# Patient Record
Sex: Female | Born: 1980 | Race: Black or African American | Hispanic: No | Marital: Single | Smoking: Former smoker
Health system: Southern US, Community
[De-identification: ages and names within clinical notes are randomized; demographics above are authoritative.]

## PROBLEM LIST (undated history)

## (undated) ENCOUNTER — Inpatient Hospital Stay (HOSPITAL_COMMUNITY): Payer: Self-pay

## (undated) DIAGNOSIS — M1990 Unspecified osteoarthritis, unspecified site: Secondary | ICD-10-CM

---
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
## 2002-04-05 ENCOUNTER — Encounter: Payer: Self-pay | Admitting: Obstetrics and Gynecology

## 2002-04-05 ENCOUNTER — Inpatient Hospital Stay (HOSPITAL_COMMUNITY): Admission: AD | Admit: 2002-04-05 | Discharge: 2002-04-05 | Payer: Self-pay | Admitting: Obstetrics and Gynecology

## 2002-04-07 ENCOUNTER — Inpatient Hospital Stay (HOSPITAL_COMMUNITY): Admission: AD | Admit: 2002-04-07 | Discharge: 2002-04-07 | Payer: Self-pay | Admitting: *Deleted

## 2002-04-20 ENCOUNTER — Inpatient Hospital Stay (HOSPITAL_COMMUNITY): Admission: AD | Admit: 2002-04-20 | Discharge: 2002-04-20 | Payer: Self-pay | Admitting: Family Medicine

## 2002-06-30 ENCOUNTER — Emergency Department (HOSPITAL_COMMUNITY): Admission: EM | Admit: 2002-06-30 | Discharge: 2002-06-30 | Payer: Self-pay | Admitting: Emergency Medicine

## 2002-09-07 ENCOUNTER — Emergency Department (HOSPITAL_COMMUNITY): Admission: EM | Admit: 2002-09-07 | Discharge: 2002-09-07 | Payer: Self-pay | Admitting: Emergency Medicine

## 2003-01-28 ENCOUNTER — Encounter: Payer: Self-pay | Admitting: Emergency Medicine

## 2003-01-28 ENCOUNTER — Emergency Department (HOSPITAL_COMMUNITY): Admission: EM | Admit: 2003-01-28 | Discharge: 2003-01-28 | Payer: Self-pay | Admitting: Emergency Medicine

## 2003-05-14 ENCOUNTER — Emergency Department (HOSPITAL_COMMUNITY): Admission: EM | Admit: 2003-05-14 | Discharge: 2003-05-14 | Payer: Self-pay | Admitting: Emergency Medicine

## 2003-06-24 ENCOUNTER — Emergency Department (HOSPITAL_COMMUNITY): Admission: EM | Admit: 2003-06-24 | Discharge: 2003-06-24 | Payer: Self-pay | Admitting: Emergency Medicine

## 2004-03-29 ENCOUNTER — Emergency Department (HOSPITAL_COMMUNITY): Admission: EM | Admit: 2004-03-29 | Discharge: 2004-03-29 | Payer: Self-pay | Admitting: Family Medicine

## 2004-06-14 ENCOUNTER — Inpatient Hospital Stay (HOSPITAL_COMMUNITY): Admission: AD | Admit: 2004-06-14 | Discharge: 2004-06-14 | Payer: Self-pay | Admitting: Obstetrics & Gynecology

## 2004-07-07 ENCOUNTER — Inpatient Hospital Stay (HOSPITAL_COMMUNITY): Admission: AD | Admit: 2004-07-07 | Discharge: 2004-07-07 | Payer: Self-pay | Admitting: *Deleted

## 2004-08-07 ENCOUNTER — Inpatient Hospital Stay (HOSPITAL_COMMUNITY): Admission: AD | Admit: 2004-08-07 | Discharge: 2004-08-07 | Payer: Self-pay | Admitting: Obstetrics and Gynecology

## 2004-09-16 ENCOUNTER — Ambulatory Visit (HOSPITAL_COMMUNITY): Admission: RE | Admit: 2004-09-16 | Discharge: 2004-09-16 | Payer: Self-pay | Admitting: *Deleted

## 2004-10-14 ENCOUNTER — Ambulatory Visit (HOSPITAL_COMMUNITY): Admission: RE | Admit: 2004-10-14 | Discharge: 2004-10-14 | Payer: Self-pay | Admitting: *Deleted

## 2004-11-03 ENCOUNTER — Inpatient Hospital Stay: Admission: AD | Admit: 2004-11-03 | Discharge: 2004-11-03 | Payer: Self-pay | Admitting: Obstetrics & Gynecology

## 2004-11-03 ENCOUNTER — Ambulatory Visit: Payer: Self-pay | Admitting: Obstetrics & Gynecology

## 2004-12-17 ENCOUNTER — Inpatient Hospital Stay (HOSPITAL_COMMUNITY): Admission: AD | Admit: 2004-12-17 | Discharge: 2004-12-17 | Payer: Self-pay | Admitting: *Deleted

## 2005-02-04 ENCOUNTER — Ambulatory Visit (HOSPITAL_COMMUNITY): Admission: RE | Admit: 2005-02-04 | Discharge: 2005-02-04 | Payer: Self-pay | Admitting: *Deleted

## 2005-02-18 ENCOUNTER — Ambulatory Visit: Payer: Self-pay | Admitting: *Deleted

## 2005-02-23 ENCOUNTER — Inpatient Hospital Stay (HOSPITAL_COMMUNITY): Admission: AD | Admit: 2005-02-23 | Discharge: 2005-02-28 | Payer: Self-pay | Admitting: Family Medicine

## 2005-02-23 ENCOUNTER — Ambulatory Visit: Payer: Self-pay | Admitting: *Deleted

## 2005-02-27 ENCOUNTER — Encounter (INDEPENDENT_AMBULATORY_CARE_PROVIDER_SITE_OTHER): Payer: Self-pay | Admitting: Specialist

## 2005-07-16 ENCOUNTER — Emergency Department (HOSPITAL_COMMUNITY): Admission: EM | Admit: 2005-07-16 | Discharge: 2005-07-16 | Payer: Self-pay | Admitting: Emergency Medicine

## 2005-10-15 ENCOUNTER — Inpatient Hospital Stay (HOSPITAL_COMMUNITY): Admission: AD | Admit: 2005-10-15 | Discharge: 2005-10-15 | Payer: Self-pay | Admitting: Gynecology

## 2005-10-22 ENCOUNTER — Encounter (INDEPENDENT_AMBULATORY_CARE_PROVIDER_SITE_OTHER): Payer: Self-pay | Admitting: Specialist

## 2005-10-22 ENCOUNTER — Ambulatory Visit: Payer: Self-pay | Admitting: Gynecology

## 2005-10-22 ENCOUNTER — Ambulatory Visit (HOSPITAL_COMMUNITY): Admission: AD | Admit: 2005-10-22 | Discharge: 2005-10-22 | Payer: Self-pay | Admitting: Gynecology

## 2006-01-23 ENCOUNTER — Inpatient Hospital Stay (HOSPITAL_COMMUNITY): Admission: AD | Admit: 2006-01-23 | Discharge: 2006-01-23 | Payer: Self-pay | Admitting: Obstetrics & Gynecology

## 2006-03-26 ENCOUNTER — Emergency Department (HOSPITAL_COMMUNITY): Admission: EM | Admit: 2006-03-26 | Discharge: 2006-03-26 | Payer: Self-pay | Admitting: Emergency Medicine

## 2006-04-03 ENCOUNTER — Inpatient Hospital Stay (HOSPITAL_COMMUNITY): Admission: AD | Admit: 2006-04-03 | Discharge: 2006-04-03 | Payer: Self-pay | Admitting: Gynecology

## 2006-06-09 ENCOUNTER — Ambulatory Visit: Payer: Self-pay | Admitting: Family Medicine

## 2006-06-09 ENCOUNTER — Inpatient Hospital Stay (HOSPITAL_COMMUNITY): Admission: AD | Admit: 2006-06-09 | Discharge: 2006-06-09 | Payer: Self-pay | Admitting: Family Medicine

## 2006-07-23 ENCOUNTER — Ambulatory Visit (HOSPITAL_COMMUNITY): Admission: RE | Admit: 2006-07-23 | Discharge: 2006-07-23 | Payer: Self-pay | Admitting: Obstetrics & Gynecology

## 2006-09-02 ENCOUNTER — Inpatient Hospital Stay (HOSPITAL_COMMUNITY): Admission: AD | Admit: 2006-09-02 | Discharge: 2006-09-06 | Payer: Self-pay | Admitting: Obstetrics & Gynecology

## 2006-09-03 ENCOUNTER — Encounter (INDEPENDENT_AMBULATORY_CARE_PROVIDER_SITE_OTHER): Payer: Self-pay | Admitting: Specialist

## 2006-10-16 ENCOUNTER — Emergency Department (HOSPITAL_COMMUNITY): Admission: EM | Admit: 2006-10-16 | Discharge: 2006-10-16 | Payer: Self-pay | Admitting: Emergency Medicine

## 2008-06-21 ENCOUNTER — Emergency Department (HOSPITAL_COMMUNITY): Admission: EM | Admit: 2008-06-21 | Discharge: 2008-06-21 | Payer: Self-pay | Admitting: Emergency Medicine

## 2009-03-14 ENCOUNTER — Emergency Department (HOSPITAL_COMMUNITY): Admission: EM | Admit: 2009-03-14 | Discharge: 2009-03-14 | Payer: Self-pay | Admitting: Emergency Medicine

## 2009-03-15 ENCOUNTER — Emergency Department (HOSPITAL_COMMUNITY): Admission: EM | Admit: 2009-03-15 | Discharge: 2009-03-15 | Payer: Self-pay | Admitting: Emergency Medicine

## 2009-06-15 ENCOUNTER — Emergency Department (HOSPITAL_COMMUNITY): Admission: EM | Admit: 2009-06-15 | Discharge: 2009-06-15 | Payer: Self-pay | Admitting: Family Medicine

## 2009-07-09 ENCOUNTER — Emergency Department (HOSPITAL_COMMUNITY): Admission: EM | Admit: 2009-07-09 | Discharge: 2009-07-09 | Payer: Self-pay | Admitting: Family Medicine

## 2009-09-04 ENCOUNTER — Emergency Department (HOSPITAL_COMMUNITY): Admission: EM | Admit: 2009-09-04 | Discharge: 2009-09-04 | Payer: Self-pay | Admitting: Family Medicine

## 2009-10-20 ENCOUNTER — Emergency Department (HOSPITAL_COMMUNITY): Admission: EM | Admit: 2009-10-20 | Discharge: 2009-10-20 | Payer: Self-pay | Admitting: Emergency Medicine

## 2009-10-27 ENCOUNTER — Ambulatory Visit (HOSPITAL_COMMUNITY): Admission: RE | Admit: 2009-10-27 | Discharge: 2009-10-27 | Payer: Self-pay | Admitting: Family Medicine

## 2009-10-27 ENCOUNTER — Emergency Department (HOSPITAL_COMMUNITY): Admission: EM | Admit: 2009-10-27 | Discharge: 2009-10-27 | Payer: Self-pay | Admitting: Family Medicine

## 2009-12-24 ENCOUNTER — Emergency Department (HOSPITAL_COMMUNITY)
Admission: EM | Admit: 2009-12-24 | Discharge: 2009-12-25 | Payer: Self-pay | Source: Home / Self Care | Admitting: Emergency Medicine

## 2010-02-14 ENCOUNTER — Emergency Department (HOSPITAL_COMMUNITY): Admission: EM | Admit: 2010-02-14 | Discharge: 2010-02-14 | Payer: Self-pay | Admitting: Family Medicine

## 2010-05-21 ENCOUNTER — Emergency Department (HOSPITAL_COMMUNITY)
Admission: EM | Admit: 2010-05-21 | Discharge: 2010-05-21 | Payer: Self-pay | Source: Home / Self Care | Admitting: Emergency Medicine

## 2010-06-30 ENCOUNTER — Encounter: Payer: Self-pay | Admitting: Gynecology

## 2010-08-24 LAB — POCT I-STAT, CHEM 8
Chloride: 104 mEq/L (ref 96–112)
Creatinine, Ser: 0.7 mg/dL (ref 0.4–1.2)
HCT: 41 % (ref 36.0–46.0)
Hemoglobin: 13.9 g/dL (ref 12.0–15.0)
TCO2: 26 mmol/L (ref 0–100)

## 2010-08-24 LAB — POCT PREGNANCY, URINE: Preg Test, Ur: NEGATIVE

## 2010-08-25 LAB — POCT PREGNANCY, URINE: Preg Test, Ur: NEGATIVE

## 2010-09-12 LAB — DIFFERENTIAL
Basophils Absolute: 0 10*3/uL (ref 0.0–0.1)
Basophils Relative: 0 % (ref 0–1)
Monocytes Absolute: 0.5 10*3/uL (ref 0.1–1.0)

## 2010-09-12 LAB — CBC
HCT: 37.8 % (ref 36.0–46.0)
MCHC: 33.6 g/dL (ref 30.0–36.0)
Platelets: 259 10*3/uL (ref 150–400)
RBC: 4.66 MIL/uL (ref 3.87–5.11)
WBC: 9.2 10*3/uL (ref 4.0–10.5)

## 2010-09-12 LAB — GONOCOCCUS CULTURE: Culture: NO GROWTH

## 2010-09-12 LAB — BASIC METABOLIC PANEL
CO2: 27 mEq/L (ref 19–32)
Creatinine, Ser: 0.68 mg/dL (ref 0.4–1.2)
GFR calc non Af Amer: >60 mL/min (ref >60)
Sodium: 138 mEq/L (ref 135–145)

## 2010-09-12 LAB — MONONUCLEOSIS SCREEN: Mono Screen: POSITIVE — AB

## 2010-09-12 LAB — RAPID STREP SCREEN (MED CTR MEBANE ONLY): Streptococcus, Group A Screen (Direct): NEGATIVE

## 2010-09-22 ENCOUNTER — Inpatient Hospital Stay (INDEPENDENT_AMBULATORY_CARE_PROVIDER_SITE_OTHER)
Admission: RE | Admit: 2010-09-22 | Discharge: 2010-09-22 | Disposition: A | Discharge status: Home / Self Care | Payer: BC Managed Care – HMO | Source: Ambulatory Visit | Attending: Family Medicine | Admitting: Family Medicine

## 2010-09-22 DIAGNOSIS — J029 Acute pharyngitis, unspecified: Secondary | ICD-10-CM

## 2010-09-22 DIAGNOSIS — J309 Allergic rhinitis, unspecified: Secondary | ICD-10-CM

## 2010-09-23 LAB — URINALYSIS, ROUTINE W REFLEX MICROSCOPIC
Hgb urine dipstick: NEGATIVE
Ketones, ur: NEGATIVE mg/dL
Urobilinogen, UA: 0.2 mg/dL (ref 0.0–1.0)

## 2010-09-23 LAB — URINE MICROSCOPIC-ADD ON

## 2010-09-23 LAB — RAPID STREP SCREEN (MED CTR MEBANE ONLY): Streptococcus, Group A Screen (Direct): NEGATIVE

## 2010-10-22 NOTE — Discharge Summary (Signed)
NAME:  Catherine Underwood, Catherine Underwood            ACCOUNT NO.:  [id number]   MEDICAL RECORD NO.:  [id number]          PATIENT TYPE:  INP   LOCATION:  9119                          [facility]ILITY:  [facility]   PHYSICIAN:  Roseanna Rainbow, M.D.DATE OF BIRTH:  04-May-1981   DATE OF ADMISSION:  09/02/2006  DATE OF DISCHARGE:  09/06/2006                               DISCHARGE SUMMARY   CHIEF COMPLAINT:  The patient is a 30 year old para 1 with an estimated  date of confinement of March 30 with an intrauterine pregnancy at 39+  weeks complaining of uterine contractions.  Please see the dictated  history and physical for further details.   HOSPITAL COURSE:  The patient was admitted.  Low-dose Pitocin was  started to augment her labor.  She failed to progress beyond 8 cm of  dilatation, and the decision was made to proceed with a cesarean  delivery.  Please see the dictated operative summary.  On postoperative  day #1, her hemoglobin was 9.  She was hemodynamically stable.  The  remainder of her hospital course was uneventful, and she was discharged  to home on postoperative day #3.   DISCHARGE DIAGNOSES:  1. Intrauterine pregnancy at term.  2. Arrest of dilatation.  3. Active phase of labor.   PROCEDURE:  Cesarean delivery.   CONDITION:  Stable.   DIET:  Regular.   MEDICATIONS:  Included Percocet.   DISPOSITION:  The patient was to follow up in the office in 1-2 weeks.      Roseanna Rainbow, M.D.  Electronically Signed     LAJ/MEDQ  D:  10/09/2006  T:  10/09/2006  Job:  [id number]

## 2010-10-25 NOTE — Discharge Summary (Signed)
NAME:  Catherine Underwood, Catherine Underwood            ACCOUNT NO.:  [id number]   MEDICAL RECORD NO.:  [id number]          PATIENT TYPE:  INP   LOCATION:  9305                          [facility]   PHYSICIAN:  Tanya S. Shawnie Underwood, M.D.   DATE OF BIRTH:  January 07, 1981   DATE OF ADMISSION:  02/23/2005  DATE OF DISCHARGE:  02/28/2005                                 DISCHARGE SUMMARY   ADMISSION DIAGNOSES:  Intrauterine pregnancy at 41-0/7 weeks for induction  of labor.   DISCHARGE DIAGNOSES:  1.  Normal spontaneous vaginal delivery.  2.  Viable 6 pound 12 ounce female.   DISCHARGE MEDICATIONS:  Prenatal vitamins, ibuprofen.   BRIEF HISTORY:  The patient is a 30 year old gravida 2, para 0-0-1-0, at 41  weeks who presented for induction of labor. The patient is GBS positive.   HOSPITAL COURSE:  Induction of labor was started on September 17 with  Cytotec x1 followed by Cervidil for 12 hours and then Pitocin was started.  Penicillin was also started for GBS prophylaxis.  An epidural was placed on  the morning of September 19.  Variable decelerations with late onset were  seen.  The patient's membranes were ruptured.  An IUPC was placed.  Pitocin  was held and amnion infusion was done.  Pitocin was restarted later in the  day on September 19.  The patient later developed a temperature of 101.4 and  was subsequently started on Unasyn and the patient's fever improved. On  September 20, at 12:39 the patient had a normal spontaneous vaginal delivery  of a female with Apgars of 2 at one minute and 3 at five minutes and 5 at 10  minutes.  Initial cord PH was 7.14.  The baby was handed to the NICU team  and was subsequently intubated.  Initial ABG in the NICU ws pH 7.27, PCO2 of  28, PO2 of 582, bicarb 12.3 and base excess -13.2.  The baby was extubated  later that day with pH of 7.4, PCO2 of 32, PO2 of 65, bicarb 19, and base  excess -4.6.  In terms of the mother, second-degree laceration was repaired.  The patient  postpartum course for the mother was uncomplicated.  Mom's blood  type is O positive. She is rubella immune.  She is breast feeding.  Would  like an IUD placed in six weeks.  She desires circumcision for the child.   CONDITION ON DISCHARGE:  Stable.   DISCHARGE INSTRUCTIONS:  The patient is to avoid sex or placing anything in  her vagina for six weeks.  She is to follow up at [facility] in six  weeks.      Benn Moulder, M.D.    ______________________________  Catherine Underwood, M.D.    MR/MEDQ  D:  02/28/2005  T:  03/01/2005  Job:  [id number]

## 2010-10-25 NOTE — Op Note (Signed)
NAME:  Catherine Underwood, Catherine Underwood            ACCOUNT NO.:  [id number]   MEDICAL RECORD NO.:  [id number]          PATIENT TYPE:  AMB   LOCATION:  SDC                           [facility]:  [facility]   PHYSICIAN:  Ginger Carne, MD  DATE OF BIRTH:  1980/06/18   DATE OF PROCEDURE:  10/22/2005  DATE OF DISCHARGE:                                 OPERATIVE REPORT   PREOPERATIVE DIAGNOSIS:  First trimester missed abortion.   POSTOPERATIVE DIAGNOSIS:  First trimester missed abortion.   PROCEDURE:  Aspiration, dilatation, curettage.   SURGEON:  Ginger Carne, MD   ASSISTANT:  None.   COMPLICATIONS:  None.   ESTIMATED BLOOD LOSS:  Minimal.   SPECIMENS:  Products of conception.   OPERATIVE FINDINGS:  External genitalia, vulva, and vagina normal.  Cervix  without erosions or lesions.  Uterus palpated to approximately 6-8 weeks in  size.  Normal contour, enlarged, consistent with 6-8 week intrauterine  pregnancy.  Both adnexa palpable and found to be normal.  Products of  conception noted in the intrauterine cavity.  No irregularities of the  cavity noted.  Patient is Rh-positive.   OPERATIVE PROCEDURE:  Patient prepped and draped in the usual fashion and  placed in the lithotomy position.  Betadine solution is used for Antiseptic,  and the patient was catheterized prior to the procedure.  After adequate  general anesthesia, a tenaculum placed on the anterior lip of the cervix.  Dilatation to accommodate a #7 suction cannula.  It was followed by sharp  and then suction curettage.  Specimen to pathology.  Patient returned to the  post anesthesia recovery room in excellent condition.      Ginger Carne, MD  Electronically Signed     SHB/MEDQ  D:  10/22/2005  T:  10/23/2005  Job:  [id number]

## 2010-10-25 NOTE — H&P (Signed)
NAME:  Catherine Underwood, Catherine Underwood            ACCOUNT NO.:  [id number]   MEDICAL RECORD NO.:  [id number]          PATIENT TYPE:  INP   LOCATION:  9164                          [facility]   PHYSICIAN:  Roseanna Rainbow, M.D.DATE OF BIRTH:  [birth year]   DATE OF ADMISSION:  09/02/2006  DATE OF DISCHARGE:                              HISTORY & PHYSICAL   CHIEF COMPLAINT:  The patient is a 30 year old para 1 with an estimated  date of confinement of September 06, 2006 with an intrauterine pregnancy at  39+ weeks, complaining of uterine contractions.   HISTORY OF PRESENT ILLNESS:  The patient reports the onset of  contractions for approximately 12 hours prior to presentation.  She  denies ruptured membranes.   ALLERGIES:  No known drug allergies.   OB RISK FACTORS:  Sickle cell trait.   PRENATAL SCREENS:  Chlamydia probe negative.  Urine culture and  sensitivity:  Insignificant growth.  Pap smear negative.  GC probe  negative.  One hour GCT 115.  GBS on March 5th negative.  Hepatitis B  surface antigen negative.  Hematocrit 34.4, hemoglobin 10.7, HIV  nonreactive.  Platelets 254,000.  Blood type is O+.  Antibody screen  negative.  RPR nonreactive.  Rubella immune.  Sickle cell positive.   The most recent ultrasound on February 14th:  The estimated fetal weight  percentile was 78%.  The amniotic fluid was normal.  No previa.   PAST GYN HISTORY:  Noncontributory.   PAST MEDICAL HISTORY:  No significant history of medical diseases.   PAST SURGICAL HISTORY:  D&C.   SOCIAL HISTORY:  She is a Immunologist, retail.  She is single.  She does  not give any significant history of alcohol usage.  Has no significant  smoking history.  Denies illicit drug use.   FAMILY HISTORY:  Kidney disease.   PAST OBSTETRICAL HISTORY:  In January, 2007, she had a spontaneous  abortion.  In September, 2006, she delivered a live born female.  Weight 6  pounds, 12 ounces.  Vaginal delivery.  No complications.   PHYSICAL EXAMINATION:  VITAL SIGNS:  Stable.  Afebrile.  Fetal heart  tracing reassuring.  Tocodynamometry:  Uterine contractions every five  minutes.  GENERAL:  Uncomfortable.  ABDOMEN:  Gravid.  PELVIC:  Sterile vaginal exam is per the RN.  The cervix is 3 cm,  dilated, 80% effaced.  EXTREMITIES:  Nontender.   ASSESSMENT:  Primipara at term, early labor.  Fetal heart tracing  consistent with fetal well being.   PLAN:  Admission.  Expectant management.      Roseanna Rainbow, M.D.  Electronically Signed     LAJ/MEDQ  D:  09/02/2006  T:  09/02/2006  Job:  [id number]

## 2010-10-25 NOTE — Op Note (Signed)
NAME:  Catherine Underwood, Catherine Underwood            ACCOUNT NO.:  [id number]   MEDICAL RECORD NO.:  [id number]          PATIENT TYPE:  INP   LOCATION:  9119                          [facility]   PHYSICIAN:  Charles A. Clearance Coots, M.D.DATE OF BIRTH:  1980-06-22   DATE OF PROCEDURE:  09/03/2006  DATE OF DISCHARGE:                               OPERATIVE REPORT   PREOPERATIVE DIAGNOSIS:  Arrest of descent.   POSTOPERATIVE DIAGNOSIS:  Arrest of descent.   PROCEDURE:  Primary low transverse cesarean section.   SURGEON:  Coral Ceo.   ANESTHESIA:  Epidural.   ESTIMATED BLOOD LOSS:  800 mL.   COMPLICATIONS:  None.   FINDINGS:  Viable female at 51.  Apgars of 9 at one minute and 9 at  five minutes, weight of 3490 grams.  Left occiput posterior position.  Normal uterus, ovaries and fallopian tubes.   OPERATION:  The patient was brought to the operating room.  After  satisfactory redosing of the epidural, the abdomen was prepped and  draped in usual sterile fashion.  Pfannenstiel skin incision was made  with a scalpel that was deepened down to the fascia with a scalpel.  Fascia was nicked in the midline, and the fascial incision was extended  to left and to right with curved Mayo scissors.  The superior and  inferior fascial edges were separated from the rectus muscle with blunt  and sharp dissection.  The rectus muscle was bluntly divided in the  midline.  The peritoneum was entered digitally and was digitally  extended to left and to right.  Bladder blade was positioned.  The  vesicouterine fold of peritoneum above the reflection of the urinary  bladder was grasped with forceps and was incised and undermined with  Metzenbaum scissors.  The incision was extended to left to right with  the Metzenbaum scissors.  Bladder flap was developed, and the bladder  blade was repositioned from the urinary bladder, placing it well out of  the operative field.  The uterus was entered transversely in the  lower  uterine segment with a scalpel.  Clear fluid was expelled.  The uterine  incision was extended to the left and to the right with the bandage  scissors.  The occiput was noted to be left occiput posterior and was  brought up into the incision and then rotated to the anterior position,  and the vertex was then delivered with the aid of fundal pressure from  the assistant.  Infant's mouth and nose were suctioned with a suction  bulb, and delivery was completed with the aid of fundal pressure from  the assistant.  The umbilical cord was doubly clamped and cut, and the  infant was handed off to the nursery staff.  The cord blood was obtained  and placenta was manually removed from the uterus intact.  The  endometrial surface was thoroughly debrided with a dry lap sponge.  The  edges of the uterine incision were grasped with ring forceps.  The  uterus was closed with a continuous interlocking suture of 0 Monocryl  from each corner to the center.  Hemostasis was  excellent.  Pelvic  cavity was thoroughly irrigated with warm saline solution, and all clots  were removed.  The bladder flap was then closed with a continuous suture  of 3-0 Monocryl.  Peritoneum was closed with continuous suture of 2-0  Monocryl.  Fascia was closed with continuous suture of 0 Vicryl  subcutaneous tissue was thoroughly irrigated with warm saline solution.  All areas of subcutaneous bleeding were coagulated with the Bovie.  The  skin  was then closed with continuous subcuticular suture of 3-0 Monocryl.  Sterile bandage was applied to the incision closure.  Surgical  technician indicated that all needle, sponge and instrument counts were  correct x2.  The patient tolerated the procedure well and was  transported to recovery room in satisfactory condition.      Charles A. Clearance Coots, M.D.  Electronically Signed     CAH/MEDQ  D:  09/03/2006  T:  09/03/2006  Job:  [id number]

## 2010-12-07 ENCOUNTER — Inpatient Hospital Stay (INDEPENDENT_AMBULATORY_CARE_PROVIDER_SITE_OTHER)
Admission: RE | Admit: 2010-12-07 | Discharge: 2010-12-07 | Disposition: A | Discharge status: Home / Self Care | Payer: BC Managed Care – HMO | Source: Ambulatory Visit | Attending: Family Medicine | Admitting: Family Medicine

## 2010-12-07 DIAGNOSIS — J40 Bronchitis, not specified as acute or chronic: Secondary | ICD-10-CM

## 2011-02-18 ENCOUNTER — Other Ambulatory Visit: Payer: Self-pay | Admitting: Obstetrics

## 2011-04-19 ENCOUNTER — Encounter: Payer: Self-pay | Admitting: Internal Medicine

## 2011-04-19 DIAGNOSIS — Z0000 Encounter for general adult medical examination without abnormal findings: Secondary | ICD-10-CM | POA: Insufficient documentation

## 2011-04-24 ENCOUNTER — Ambulatory Visit: Payer: BC Managed Care – HMO | Admitting: Internal Medicine

## 2011-04-24 DIAGNOSIS — Z0289 Encounter for other administrative examinations: Secondary | ICD-10-CM

## 2011-07-27 ENCOUNTER — Emergency Department (HOSPITAL_COMMUNITY)
Admission: EM | Admit: 2011-07-27 | Discharge: 2011-07-28 | Disposition: A | Discharge status: Home / Self Care | Payer: BC Managed Care – HMO | Attending: Emergency Medicine | Admitting: Emergency Medicine

## 2011-07-27 ENCOUNTER — Encounter (HOSPITAL_COMMUNITY): Payer: Self-pay | Admitting: Emergency Medicine

## 2011-07-27 ENCOUNTER — Emergency Department (HOSPITAL_COMMUNITY): Payer: BC Managed Care – HMO

## 2011-07-27 ENCOUNTER — Other Ambulatory Visit: Payer: Self-pay

## 2011-07-27 DIAGNOSIS — R209 Unspecified disturbances of skin sensation: Secondary | ICD-10-CM | POA: Insufficient documentation

## 2011-07-27 DIAGNOSIS — R0789 Other chest pain: Secondary | ICD-10-CM

## 2011-07-27 DIAGNOSIS — R079 Chest pain, unspecified: Secondary | ICD-10-CM | POA: Insufficient documentation

## 2011-07-27 DIAGNOSIS — R071 Chest pain on breathing: Secondary | ICD-10-CM | POA: Insufficient documentation

## 2011-07-27 LAB — D-DIMER, QUANTITATIVE: D-Dimer, Quant: 0.46 ug/mL-FEU (ref 0.00–0.48)

## 2011-07-27 LAB — POCT I-STAT TROPONIN I: Troponin i, poc: 0 ng/mL (ref 0.00–0.08)

## 2011-07-27 MED ORDER — IBUPROFEN 800 MG PO TABS
800.0000 mg | ORAL_TABLET | Freq: Once | ORAL | Status: AC
  Administered 2011-07-27: 800 mg via ORAL
  Filled 2011-07-27: qty 1

## 2011-07-27 NOTE — ED Notes (Signed)
C/o L sided squeezing chest pain since Friday.  Denies any other symptoms.

## 2011-07-28 MED ORDER — IBUPROFEN 800 MG PO TABS: 800.0000 mg | ORAL_TABLET | Freq: Four times a day (QID) | ORAL | Status: AC | PRN

## 2011-07-28 NOTE — ED Notes (Signed)
Patient reports chest pain, possibly started yesterday at work states painful when moving feels it deep in chest. A&O x 3 respirations even unlabored and relaxed no N/V/D patient sitting and reading book no distress noted

## 2011-07-28 NOTE — Discharge Instructions (Signed)
Chest Wall Pain Chest wall pain is pain in or around the bones and muscles of your chest. This may occur:   On its own (spontaneously).   After a viral illness such as the flu.   Through injur.   From coughing.   Minor exercise.  It may take up to 6 weeks to get better; longer if you must stay physically active in your work and activities. HOME CARE INSTRUCTIONS   Avoid over-tiring physical activity. Try not to strain or perform activities which cause pain. This would include any activities using chest, belly (abdominal) and side muscles, especially if heavy weights are used.   Use ice on the painful area for 15 to 20 minutes per hour while awake for the first 2 days. Place the ice in a plastic bag and place a towel between the bag of ice and your skin.   Only take over-the-counter or prescription medicines for pain, discomfort, or fever as directed by your caregiver.  SEEK IMMEDIATE MEDICAL CARE IF:   Your pain increases or you are very uncomfortable.   An oral temperature above 102 F (38.9 C)develops.   Your chest pains become worse.   You develop new, unexplained problems (symptoms).   You develop nausea, vomiting, sweating or feel light headed.   You develop a cough which produces phlegm (sputum) or you cough up blood.  MAKE SURE YOU:   Understand these instructions.   Will watch your condition.   Will get help right away if you are not doing well or get worse.  Document Released: 05/26/2005 Document Revised: 12/09/2010 Document Reviewed: 01/12/2008 [employer] Patient Information 2012 [employer]. 

## 2011-07-28 NOTE — ED Provider Notes (Signed)
History     CSN: [id number]  Arrival date & time 07/27/11  2203   First MD Initiated Contact with Patient 07/27/11 2318      Chief Complaint  Patient presents with  . Chest Pain    (Consider location/radiation/quality/duration/timing/severity/associated sxs/prior treatment) HPI Comments: Patient here with left anterior chest wall pain - states that this started Friday when she went to go "swat her child" - states that since then she has taken ibuprofen which helped the pain a little and tylenol which really did not help the pain - she reports pain reproducible with palpation and deep breath - describes the pain as squeezing in nature - denies radiation of the pain, shortness of breath, cough, fever, chills, congestion, nausea, vomiting or calf pain or swelling.  Patient is a 31 y.o. female presenting with chest pain. The history is provided by the patient. No language interpreter was used.  Chest Pain The chest pain began 2 days ago. Chest pain occurs constantly. The chest pain is unchanged. The pain is associated with breathing. At its most intense, the pain is at 7/10. The pain is currently at 7/10. The quality of the pain is described as squeezing. The pain does not radiate. Chest pain is worsened by certain positions and deep breathing. Pertinent negatives for primary symptoms include no fever, no fatigue, no syncope, no shortness of breath, no cough, no wheezing, no palpitations, no abdominal pain, no nausea, no vomiting, no dizziness and no altered mental status.  Pertinent negatives for associated symptoms include no claudication, no diaphoresis, no lower extremity edema, no near-syncope, no numbness, no orthopnea, no paroxysmal nocturnal dyspnea and no weakness. She tried NSAIDs for the symptoms. Risk factors include no known risk factors.     History reviewed. No pertinent past medical history.  Past Surgical History  Procedure Date  . Cesarean section     No family history on  file.  History  Substance Use Topics  . Smoking status: Current Everyday Smoker  . Smokeless tobacco: Not on file  . Alcohol Use: No    OB History    Grav Para Term Preterm Abortions TAB SAB Ect Mult Living                  Review of Systems  Constitutional: Negative for fever, diaphoresis and fatigue.  Respiratory: Negative for cough, shortness of breath and wheezing.   Cardiovascular: Positive for chest pain. Negative for palpitations, orthopnea, claudication, syncope and near-syncope.  Gastrointestinal: Negative for nausea, vomiting and abdominal pain.  Neurological: Negative for dizziness, weakness and numbness.  Psychiatric/Behavioral: Negative for altered mental status.  All other systems reviewed and are negative.    Allergies  Penicillins  Home Medications   Current Outpatient Rx  Name Route Sig Dispense Refill  . IMPLANON [place] Subcutaneous Inject 1 application into the skin continuous.      BP 113/69  Pulse 102  Temp(Src) 98.2 F (36.8 C) (Oral)  Resp 18  SpO2 100%  LMP 07/23/2011  Physical Exam  Nursing note and vitals reviewed. Constitutional: She is oriented to person, place, and time. She appears well-developed and well-nourished. No distress.  HENT:  Head: Normocephalic and atraumatic.  Right Ear: External ear normal.  Left Ear: External ear normal.  Nose: Nose normal.  Mouth/Throat: Oropharynx is clear and moist. No oropharyngeal exudate.  Eyes: Conjunctivae are normal. Pupils are equal, round, and reactive to light. No scleral icterus.  Neck: Normal range of motion. Neck supple.  Cardiovascular: Normal  rate, regular rhythm and normal heart sounds.  Exam reveals no gallop and no friction rub.   No murmur heard. Pulmonary/Chest: Effort normal and breath sounds normal. No respiratory distress. She has no wheezes. She exhibits tenderness.    Abdominal: Soft. Bowel sounds are normal. She exhibits no distension. There is no tenderness.    Musculoskeletal: Normal range of motion. She exhibits no edema and no tenderness.  Lymphadenopathy:    She has no cervical adenopathy.  Neurological: She is alert and oriented to person, place, and time. No cranial nerve deficit.  Skin: Skin is warm and dry. No rash noted. No erythema. No pallor.  Psychiatric: She has a normal mood and affect. Her behavior is normal. Judgment and thought content normal.    ED Course  Procedures (including critical care time)   Labs Reviewed  D-DIMER, QUANTITATIVE  POCT I-STAT TROPONIN I   Dg Chest 2 View  07/28/2011  *RADIOLOGY REPORT*  Clinical Data: Left chest pain.  CHEST - 2 VIEW  Comparison: 10/27/2009.  Findings: Normal sized heart.  Clear lungs.  Minimal central peribronchial thickening with little change.  Mild scoliosis.  IMPRESSION: Minimal chronic bronchitic changes.  Original Report Authenticated By: Darrol Angel, M.D.   Results for orders placed during the hospital encounter of 07/27/11  D-DIMER, QUANTITATIVE      Component Value Range   D-Dimer, Quant 0.46  0.00 - 0.48 (ug/mL-FEU)  POCT I-STAT TROPONIN I      Component Value Range   Troponin i, poc 0.00  0.00 - 0.08 (ng/mL)   Comment 3            Dg Chest 2 View  07/28/2011  *RADIOLOGY REPORT*  Clinical Data: Left chest pain.  CHEST - 2 VIEW  Comparison: 10/27/2009.  Findings: Normal sized heart.  Clear lungs.  Minimal central peribronchial thickening with little change.  Mild scoliosis.  IMPRESSION: Minimal chronic bronchitic changes.  Original Report Authenticated By: Darrol Angel, M.D.     Date: 07/28/2011  Rate: 106  Rhythm: sinus tachycardia  QRS Axis: normal  Intervals: normal  ST/T Wave abnormalities: normal and nonspecific T wave changes  Conduction Disutrbances:none  Narrative Interpretation: Reviewed by Dr. Estell Harpin  Old EKG Reviewed: none available    Chest wall pain    MDM  There is no indication of cardiac chest pain - no fever or chills - pain is  reporducible on palpation - patient reports pain relief with ibuprofen.        Izola Price [place], [place] 07/28/11 0104

## 2011-07-28 NOTE — ED Provider Notes (Signed)
Medical screening examination/treatment/procedure(s) were performed by non-physician practitioner and as supervising physician I was immediately available for consultation/collaboration.   Gwyneth Sprout, MD 07/28/11 [phone]

## 2011-09-06 ENCOUNTER — Encounter (HOSPITAL_COMMUNITY): Payer: Self-pay | Admitting: Emergency Medicine

## 2011-09-06 ENCOUNTER — Emergency Department (HOSPITAL_COMMUNITY)
Admission: EM | Admit: 2011-09-06 | Discharge: 2011-09-07 | Disposition: A | Discharge status: Home / Self Care | Payer: BC Managed Care – HMO | Attending: Emergency Medicine | Admitting: Emergency Medicine

## 2011-09-06 DIAGNOSIS — J020 Streptococcal pharyngitis: Secondary | ICD-10-CM

## 2011-09-06 DIAGNOSIS — F17200 Nicotine dependence, unspecified, uncomplicated: Secondary | ICD-10-CM | POA: Insufficient documentation

## 2011-09-06 NOTE — ED Notes (Signed)
PT. REPORTS SORE THROAT WITH PRODUCTIVE COUGH FOR SEVERAL DAYS .

## 2011-09-06 NOTE — ED Notes (Addendum)
Pt is in Peds with child

## 2011-09-07 LAB — RAPID STREP SCREEN (MED CTR MEBANE ONLY): Streptococcus, Group A Screen (Direct): POSITIVE — AB

## 2011-09-07 MED ORDER — AZITHROMYCIN 250 MG PO TABS: 500.0000 mg | ORAL_TABLET | Freq: Every day | ORAL | Status: AC

## 2011-09-07 MED ORDER — DEXAMETHASONE SODIUM PHOSPHATE 10 MG/ML IJ SOLN
10.0000 mg | Freq: Once | INTRAMUSCULAR | Status: AC
  Administered 2011-09-07: 10 mg via INTRAMUSCULAR
  Filled 2011-09-07: qty 1

## 2011-09-07 NOTE — ED Provider Notes (Signed)
History     CSN: [id number]  Arrival date & time 09/06/11  2138   First MD Initiated Contact with Patient 09/07/11 0025      Chief Complaint  Patient presents with  . Sore Throat    (Consider location/radiation/quality/duration/timing/severity/associated sxs/prior treatment) HPI Comments: Patient here with children - she reports that they all have had a sore throat for the past 2-3 days - reports bilateral ear pain and pain with swallowing - reports chills, fever, myalgias, and headache.  Denies nausea, vomiting - is able to swallow secretions.  Patient is a 31 y.o. female presenting with pharyngitis. The history is provided by the patient. No language interpreter was used.  Sore Throat This is a new problem. The current episode started yesterday. The problem occurs constantly. The problem has been unchanged. Associated symptoms include chills, coughing, a fever, headaches, a sore throat and swollen glands. Pertinent negatives include no abdominal pain, anorexia, arthralgias, change in bowel habit, chest pain, congestion, diaphoresis, fatigue, joint swelling, myalgias, nausea, neck pain, numbness, rash, urinary symptoms, vertigo, visual change, vomiting or weakness. The symptoms are aggravated by swallowing. She has tried nothing for the symptoms. The treatment provided no relief.    History reviewed. No pertinent past medical history.  Past Surgical History  Procedure Date  . Cesarean section     No family history on file.  History  Substance Use Topics  . Smoking status: Current Everyday Smoker  . Smokeless tobacco: Not on file  . Alcohol Use: Yes    OB History    Grav Para Term Preterm Abortions TAB SAB Ect Mult Living                  Review of Systems  Constitutional: Positive for fever and chills. Negative for diaphoresis and fatigue.  HENT: Positive for sore throat. Negative for congestion and neck pain.   Respiratory: Positive for cough.   Cardiovascular:  Negative for chest pain.  Gastrointestinal: Negative for nausea, vomiting, abdominal pain, anorexia and change in bowel habit.  Musculoskeletal: Negative for myalgias, joint swelling and arthralgias.  Skin: Negative for rash.  Neurological: Positive for headaches. Negative for vertigo, weakness and numbness.  All other systems reviewed and are negative.    Allergies  Penicillins  Home Medications   Current Outpatient Rx  Name Route Sig Dispense Refill  . IMPLANON [place] Subcutaneous Inject 1 application into the skin continuous.      BP 112/69  Pulse 113  Temp(Src) 99.3 F (37.4 C) (Oral)  Resp 20  SpO2 99%  Physical Exam  Nursing note and vitals reviewed. Constitutional: She is oriented to person, place, and time. She appears well-developed and well-nourished. No distress.  HENT:  Head: Normocephalic and atraumatic.  Right Ear: External ear normal.  Left Ear: External ear normal.  Nose: Nose normal.  Mouth/Throat: Oropharyngeal exudate present.  Eyes: Conjunctivae are normal. Pupils are equal, round, and reactive to light. No scleral icterus.  Neck: Normal range of motion. Neck supple.       Bilateral cervical adenopathy  Cardiovascular: Normal rate, regular rhythm and normal heart sounds.  Exam reveals no gallop and no friction rub.   No murmur heard. Pulmonary/Chest: Effort normal and breath sounds normal. No respiratory distress. She has no wheezes. She has no rales. She exhibits no tenderness.  Abdominal: Soft. Bowel sounds are normal. She exhibits no distension. There is no tenderness.  Musculoskeletal: Normal range of motion. She exhibits no edema and no tenderness.  Lymphadenopathy:  She has cervical adenopathy.  Neurological: She is alert and oriented to person, place, and time. No cranial nerve deficit.  Skin: Skin is warm and dry. No rash noted. No erythema. No pallor.  Psychiatric: She has a normal mood and affect. Her behavior is normal. Judgment and thought  content normal.    ED Course  Procedures (including critical care time)  Labs Reviewed  RAPID STREP SCREEN - Abnormal; Notable for the following:    Streptococcus, Group A Screen (Direct) POSITIVE (*)    All other components within normal limits   No results found.   Strep pharyngitis    MDM  Patient with strep pharyngitis - no evidence of peritonsilar abscess - will treat - given steroid shot here.        Izola Price [place], [place] 09/07/11 [phone]

## 2011-09-07 NOTE — Discharge Instructions (Signed)

## 2011-09-08 ENCOUNTER — Encounter (HOSPITAL_COMMUNITY): Payer: Self-pay | Admitting: *Deleted

## 2011-09-08 ENCOUNTER — Emergency Department (INDEPENDENT_AMBULATORY_CARE_PROVIDER_SITE_OTHER)
Admission: EM | Admit: 2011-09-08 | Discharge: 2011-09-08 | Disposition: A | Discharge status: Home / Self Care | Payer: BC Managed Care – HMO | Source: Home / Self Care | Attending: Family Medicine | Admitting: Family Medicine

## 2011-09-08 DIAGNOSIS — J020 Streptococcal pharyngitis: Secondary | ICD-10-CM

## 2011-09-08 MED ORDER — LIDOCAINE VISCOUS 2 % MT SOLN: 20.0000 mL | OROMUCOSAL | Status: AC | PRN

## 2011-09-08 NOTE — ED Provider Notes (Signed)
History     CSN: [id number]  Arrival date & time 09/08/11  1314   First MD Initiated Contact with Patient 09/08/11 1558      Chief Complaint  Patient presents with  . Sore Throat    (Consider location/radiation/quality/duration/timing/severity/associated sxs/prior treatment) HPI Comments: Catherine Underwood presents for evaluation of persistent, sore throat. She was evaluated in the emergency department 2 nights ago. She was given a steroid shot and a prescription for antibiotics. However, she did not fill the antibiotic prescription until today. She reports the reason is that she is allergic to red dye in the first set of pills or dyed Red. Therefore, she had to exchange them for white pills. She took the first dose today. She reported some improvement in her symptoms after the steroid shot in the emergency department.  Patient is a 31 y.o. female presenting with pharyngitis. The history is provided by the patient.  Sore Throat This is a new problem. The current episode started more than 2 days ago. The problem occurs constantly. The problem has not changed since onset.The symptoms are aggravated by nothing. The symptoms are relieved by nothing.    History reviewed. No pertinent past medical history.  Past Surgical History  Procedure Date  . Cesarean section     No family history on file.  History  Substance Use Topics  . Smoking status: Current Everyday Smoker  . Smokeless tobacco: Not on file  . Alcohol Use: Yes    OB History    Grav Para Term Preterm Abortions TAB SAB Ect Mult Living                  Review of Systems  Constitutional: Negative.   HENT: Positive for sore throat and trouble swallowing.   Eyes: Negative.   Respiratory: Negative.   Cardiovascular: Negative.   Gastrointestinal: Negative.   Genitourinary: Negative.   Musculoskeletal: Negative.   Skin: Negative.   Neurological: Negative.     Allergies  Penicillins and Red dye  Home Medications   Current  Outpatient Rx  Name Route Sig Dispense Refill  . AZITHROMYCIN 250 MG PO TABS Oral Take 2 tablets (500 mg total) by mouth daily. 6 tablet 0    Then 1 tablet daily x 4 days.  . IMPLANON [place] Subcutaneous Inject 1 application into the skin continuous.    Marland Kitchen HYDROXYZINE HCL 10 MG PO TABS Oral Take 10 mg by mouth at bedtime as needed. For allergies.    Marland Kitchen LIDOCAINE VISCOUS 2 % MT SOLN Oral Take 20 mLs by mouth as needed for pain. Gargle with 15 to 20 ml every 3 to 4 hours as needed for pain; do not swallow 100 mL 0  . OVER THE COUNTER MEDICATION Oral Take 1 tablet by mouth daily as needed. For cold symptoms. ( OTC generic mucinex)    . PHENTERMINE HCL 37.5 MG PO CAPS Oral Take 37.5 mg by mouth every morning.      BP 122/90  Pulse 86  Temp(Src) 99.2 F (37.3 C) (Oral)  Resp 14  SpO2 99%  Physical Exam  Nursing note and vitals reviewed. Constitutional: She is oriented to person, place, and time. She appears well-developed and well-nourished.  HENT:  Head: Normocephalic and atraumatic.  Right Ear: Tympanic membrane normal.  Left Ear: Tympanic membrane normal.  Mouth/Throat: Uvula is midline and mucous membranes are normal. Oropharyngeal exudate present.    Eyes: EOM are normal.  Neck: Normal range of motion.  Cardiovascular: Normal rate, regular rhythm,  S1 normal, S2 normal and normal heart sounds.   No murmur heard. Pulmonary/Chest: Effort normal and breath sounds normal. She has no decreased breath sounds. She has no wheezes. She has no rhonchi.  Musculoskeletal: Normal range of motion.  Neurological: She is alert and oriented to person, place, and time.  Skin: Skin is warm and dry.  Psychiatric: Her behavior is normal.    ED Course  Procedures (including critical care time)  Labs Reviewed - No data to display No results found.   1. Strep pharyngitis       MDM  rx for viscous lidocaine given; already on azithromycin from ED        Renaee Munda, MD 09/08/11 [phone]

## 2011-09-08 NOTE — ED Notes (Signed)
Pt  Reports  She  Was  Seen  Er  Late  Sat  Night  1.5  Days  Ago  She  Did  Not  Get  Her rx  Till today     She reports  Throat  Feels like  It is  Swollen and  Continues  To have  Pain    She  Was  Diagnosed  With strep throat   Er  -  She  Is  Sitting  Upright on exam table  Speaking in  Complete  sentances

## 2011-09-08 NOTE — ED Provider Notes (Signed)
Medical screening examination/treatment/procedure(s) were performed by non-physician practitioner and as supervising physician I was immediately available for consultation/collaboration.   Laray Anger, DO 09/08/11 [phone]

## 2011-09-08 NOTE — Discharge Instructions (Signed)
Continue antibiotics as directed and as discussed. Use viscous lidocaine as directed. DO NOT SWALLOW. Use prior to eating or drinking taking care not to bite soft tissue structures. Return to care should your symptoms not improve, or worsen in any way, such as inability to eat or drink, difficulty breathing, shortness of breath, etc.

## 2011-12-05 ENCOUNTER — Emergency Department (INDEPENDENT_AMBULATORY_CARE_PROVIDER_SITE_OTHER)
Admission: EM | Admit: 2011-12-05 | Discharge: 2011-12-05 | Disposition: A | Discharge status: Home / Self Care | Payer: BC Managed Care – PPO | Source: Home / Self Care | Attending: Family Medicine | Admitting: Family Medicine

## 2011-12-05 ENCOUNTER — Encounter (HOSPITAL_COMMUNITY): Payer: Self-pay | Admitting: *Deleted

## 2011-12-05 DIAGNOSIS — J069 Acute upper respiratory infection, unspecified: Secondary | ICD-10-CM

## 2011-12-05 LAB — POCT RAPID STREP A: Streptococcus, Group A Screen (Direct): NEGATIVE

## 2011-12-05 MED ORDER — ACETAMINOPHEN-CODEINE 120-12 MG/5ML PO SUSP: 5.0000 mL | Freq: Four times a day (QID) | ORAL | Status: AC | PRN

## 2011-12-05 MED ORDER — PREDNISONE 20 MG PO TABS: ORAL_TABLET | ORAL | Status: AC

## 2011-12-05 NOTE — ED Notes (Signed)
Pt  Has  Symptoms  Of  sorethroat       X  sev  Days  - and  What  She  describes   As  An allergic reaction  To her  Uvula  Which  Is  Today  -  She  Repports  History  Of  Similar symptoms  In past  And  Was RX   hydrazoline  -  At thios  Time  She  apepars  In no  Acute  Distress  Speaking in  Complete  sentances  And  Is in no  Acute  Distress

## 2011-12-05 NOTE — ED Provider Notes (Signed)
History     CSN: [id number]  Arrival date & time 12/05/11  1909   First MD Initiated Contact with Patient 12/05/11 2007      Chief Complaint  Patient presents with  . 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 Here complaining of sore throat and swelling in her uvula that started today. States that she had the symptoms many times in the past every time she is exposed to red dye containing food. This time she believes she got exposed from unknown source. Although she reports having some nasal congestion with clear rhinorrhea in the last 2 or 3 days. Denies fever or chills, no cough, chest pain, shortness of breath or difficulty breathing. Patient able to swallow solids and fluids with minimal discomfort. One of her sons also having cough and congestion in the last week.    History reviewed. No pertinent past medical history.  Past Surgical History  Procedure Date  . Cesarean section     History reviewed. No pertinent family history.  History  Substance Use Topics  . Smoking status: Current Everyday Smoker  . Smokeless tobacco: Not on file  . Alcohol Use: Yes    OB History    Grav Para Term Preterm Abortions TAB SAB Ect Mult Living                  Review of Systems  Constitutional: Negative for fever, chills, diaphoresis and fatigue.       10 systems reviewed and  pertinent negative and positive symptoms are as per HPI.     HENT: Positive for congestion and sore throat. Negative for ear pain, facial swelling, neck pain, neck stiffness and ear discharge.   Respiratory: Negative for chest tightness and shortness of breath.   Gastrointestinal: Negative for nausea, vomiting and diarrhea.  Genitourinary: Negative for genital sores.  Skin: Negative for rash.  Neurological: Negative for dizziness and headaches.    Allergies  Penicillins and  Red dye  Home Medications   Current Outpatient Rx  Name Route Sig Dispense Refill  . ACETAMINOPHEN-CODEINE 120-12 MG/5ML PO SUSP Oral Take 5 mLs by mouth every 6 (six) hours as needed for pain. 60 mL 0  . IMPLANON [place] Subcutaneous Inject 1 application into the skin continuous.    Marland Kitchen HYDROXYZINE HCL 10 MG PO TABS Oral Take 10 mg by mouth at bedtime as needed. For allergies.    Marland Kitchen OVER THE COUNTER MEDICATION Oral Take 1 tablet by mouth daily as needed. For cold symptoms. ( OTC generic mucinex)    . PHENTERMINE HCL 37.5 MG PO CAPS Oral Take 37.5 mg by mouth every morning.    Marland Kitchen PREDNISONE 20 MG PO TABS  2 tabs by mouth daily for 3 days 6 tablet 0    BP 107/74  Pulse 88  Temp 98.4 F (36.9 C) (Oral)  Resp 18  SpO2 100%  Physical Exam  Nursing note and vitals reviewed. Constitutional: She is oriented to person, place, and time. She appears well-developed and well-nourished. No distress.  HENT:  Head: Normocephalic and atraumatic.       Nasal Congestion with erythema and swelling of nasal turbinates, clear rhinorrhea. mild pharyngeal erythema no exudates. There is mild swelling with erythema at tip of uvula. No uvula deviation. No trismus. TM's with increased vascular markings otherwise normal bilaterally no swelling or bulging   Eyes: Conjunctivae and EOM are normal. Pupils are equal, round, and  reactive to light.  Neck: Normal range of motion. Neck supple.  Cardiovascular: Normal rate, regular rhythm and normal heart sounds.   Pulmonary/Chest: Effort normal and breath sounds normal. No respiratory distress. She has no wheezes. She has no rales.  Abdominal: Soft. She exhibits no distension. There is no tenderness.  Lymphadenopathy:    She has no cervical adenopathy.  Neurological: She is alert and oriented to person, place, and time.  Skin: No rash noted.    ED Course  Procedures (including critical care time)   Labs Reviewed  POCT RAPID STREP A ([facility] ONLY)   No results  found.   1. URI, acute       MDM  Negative strep test. Impress upper respiratory infection likely viral. Minimal uvula swelling. Prescribe prednisone and Tylenol #3. Supportive care and red flags that should prompt her return to medical attention were discussed with patient and provided in writing.         Sharin Grave, MD 12/07/11 [phone]

## 2011-12-05 NOTE — Discharge Instructions (Signed)
And sold You need to quit smoking ! Your rapid strep test is negative. Is likely you have a viral upper respiratory infection Is very important top keep well hydrated. Take the prescribed medications as instructed. Use nasal saline spray at least 3 times a day. (simply saline is over the counter) can alternate with prescription nasal steroid. Return if difficulty breathing or not keeping fluids down.

## 2012-07-24 ENCOUNTER — Emergency Department (INDEPENDENT_AMBULATORY_CARE_PROVIDER_SITE_OTHER)
Admission: EM | Admit: 2012-07-24 | Discharge: 2012-07-24 | Disposition: A | Discharge status: Home / Self Care | Payer: BC Managed Care – PPO | Source: Home / Self Care | Attending: Family Medicine | Admitting: Family Medicine

## 2012-07-24 ENCOUNTER — Encounter (HOSPITAL_COMMUNITY): Payer: Self-pay | Admitting: Emergency Medicine

## 2012-07-24 DIAGNOSIS — S161XXA Strain of muscle, fascia and tendon at neck level, initial encounter: Secondary | ICD-10-CM

## 2012-07-24 DIAGNOSIS — S139XXA Sprain of joints and ligaments of unspecified parts of neck, initial encounter: Secondary | ICD-10-CM

## 2012-07-24 MED ORDER — CYCLOBENZAPRINE HCL 10 MG PO TABS: 10.0000 mg | ORAL_TABLET | Freq: Two times a day (BID) | ORAL | Status: DC | PRN

## 2012-07-24 MED ORDER — NAPROXEN 500 MG PO TABS: 500.0000 mg | ORAL_TABLET | Freq: Two times a day (BID) | ORAL | Status: DC

## 2012-07-24 MED ORDER — TRAMADOL HCL 50 MG PO TABS: 50.0000 mg | ORAL_TABLET | Freq: Three times a day (TID) | ORAL | Status: DC | PRN

## 2012-07-24 NOTE — ED Notes (Signed)
Pt states that she slipped on ice yesterday falling backwards hitting head back and shoulder. Pt c/o pain in the right shoulder, headache, pt did not loose consciousness.   Pt has used ibuprofen 800 mg with mild relief.

## 2012-07-24 NOTE — ED Notes (Signed)
Waiting discharge papers 

## 2012-07-28 NOTE — ED Provider Notes (Signed)
History     CSN: [id number]  Arrival date & time 07/24/12  1135   First MD Initiated Contact with Patient 07/24/12 1158      Chief Complaint  Patient presents with  . Fall    slipped on ice falling backwards hitting head back and right shoulder.     (Consider location/radiation/quality/duration/timing/severity/associated sxs/prior treatment) HPI Comments: 32 year old female smoker otherwise no past medical history. Here complaining of right shoulder and right side neck pain after she slipped in the snow fell backwards yesterday landing her back and shoulders. Denies hitting her head. Denies loss of consciousness. Patient was able to get up by self. Denies pain in the low back. No extremity weakness, numbness or paresthesias. Reports pain worse with head rotation movements. And right arm movement. Took ibuprofen 800 mg this morning with some relief.   History reviewed. No pertinent past medical history.  Past Surgical History  Procedure Laterality Date  . Cesarean section    . Cesarean section      History reviewed. No pertinent family history.  History  Substance Use Topics  . Smoking status: Current Every Day Smoker  . Smokeless tobacco: Not on file  . Alcohol Use: Yes    OB History   Grav Para Term Preterm Abortions TAB SAB Ect Mult Living                  Review of Systems  Constitutional: Negative for fever and chills.  HENT: Positive for neck pain and neck stiffness. Negative for ear pain, congestion, sore throat, facial swelling and rhinorrhea.   Eyes: Negative for visual disturbance.  Gastrointestinal: Negative for nausea and vomiting.  Musculoskeletal:       As per HPI  Skin: Negative for rash and wound.  Neurological: Positive for headaches. Negative for dizziness.  All other systems reviewed and are negative.    Allergies  Penicillins and Red dye  Home Medications   Current Outpatient Rx  Name  Route  Sig  Dispense  Refill  . cyclobenzaprine  (FLEXERIL) 10 MG tablet   Oral   Take 1 tablet (10 mg total) by mouth 2 (two) times daily as needed for muscle spasms.   20 tablet   0   . Etonogestrel (IMPLANON [place])   Subcutaneous   Inject 1 application into the skin continuous.         . hydrOXYzine (ATARAX/VISTARIL) 10 MG tablet   Oral   Take 10 mg by mouth at bedtime as needed. For allergies.         . naproxen (NAPROSYN) 500 MG tablet   Oral   Take 1 tablet (500 mg total) by mouth 2 (two) times daily with a meal.   20 tablet   0   . OVER THE COUNTER MEDICATION   Oral   Take 1 tablet by mouth daily as needed. For cold symptoms. ( OTC generic mucinex)         . phentermine 37.5 MG capsule   Oral   Take 37.5 mg by mouth every morning.         . traMADol (ULTRAM) 50 MG tablet   Oral   Take 1 tablet (50 mg total) by mouth every 8 (eight) hours as needed for pain.   20 tablet   0     BP 121/80  Pulse 76  Temp(Src) 99.2 F (37.3 C) (Oral)  Resp 18  SpO2 100%  LMP 07/21/2012  Physical Exam  Nursing note and vitals reviewed.  Constitutional: She is oriented to person, place, and time. She appears well-developed and well-nourished. No distress.  HENT:  Head: Normocephalic and atraumatic.  Right Ear: External ear normal.  Left Ear: External ear normal.  Mouth/Throat: Oropharynx is clear and moist. No oropharyngeal exudate.  Eyes: Conjunctivae and EOM are normal. Pupils are equal, round, and reactive to light.  Neck: Neck supple. No thyromegaly present.  Cardiovascular: Normal rate, regular rhythm and normal heart sounds.   Pulmonary/Chest: Effort normal and breath sounds normal.  Musculoskeletal:  Cervical spine central: No obvious deformity. No pain over bone processes.  Fair range of motion despite reported pain. Able to touch chest with chin and extend with minimal discomfort. Pain worse with bilateral rotation. Increased tone and tenderness to palpation over sternocleidomastoid muscles mostly on right  side. Negative Spurling test. Also tenderness to palpation over upper thoracic paraspinal muscles in right side. Right shoulder exam is normal. Upper extremities neurovascularly intact.    Lymphadenopathy:    She has no cervical adenopathy.  Neurological: She is alert and oriented to person, place, and time.  Skin: No rash noted. She is not diaphoretic.  No scalp contusion, hematomas or lacerations.    ED Course  Procedures (including critical care time)  Labs Reviewed - No data to display No results found.   1. Neck muscle strain, initial encounter       MDM  No pain over bony prominences. Findings on examination concerning for bone fracture or dislocation. Impress muscle strain. Prescribed Flexeril, naproxen and tramadol. No x-rays done today. Supportive care and red flags that should prompt her return to medical attention discussed with patient and provided in writing.        Sharin Grave, MD 07/29/12 1610

## 2013-01-07 ENCOUNTER — Emergency Department (HOSPITAL_COMMUNITY)
Admission: EM | Admit: 2013-01-07 | Discharge: 2013-01-07 | Disposition: A | Discharge status: Home / Self Care | Payer: BC Managed Care – PPO | Attending: Emergency Medicine | Admitting: Emergency Medicine

## 2013-01-07 ENCOUNTER — Encounter (HOSPITAL_COMMUNITY): Payer: Self-pay | Admitting: Adult Health

## 2013-01-07 DIAGNOSIS — Z79899 Other long term (current) drug therapy: Secondary | ICD-10-CM | POA: Insufficient documentation

## 2013-01-07 DIAGNOSIS — J3489 Other specified disorders of nose and nasal sinuses: Secondary | ICD-10-CM | POA: Insufficient documentation

## 2013-01-07 DIAGNOSIS — IMO0002 Reserved for concepts with insufficient information to code with codable children: Secondary | ICD-10-CM | POA: Insufficient documentation

## 2013-01-07 DIAGNOSIS — Z880 Allergy status to penicillin: Secondary | ICD-10-CM | POA: Insufficient documentation

## 2013-01-07 DIAGNOSIS — R0981 Nasal congestion: Secondary | ICD-10-CM

## 2013-01-07 DIAGNOSIS — F17200 Nicotine dependence, unspecified, uncomplicated: Secondary | ICD-10-CM | POA: Insufficient documentation

## 2013-01-07 MED ORDER — FLUTICASONE PROPIONATE 50 MCG/ACT NA SUSP: 2.0000 | Freq: Every day | NASAL | Status: DC

## 2013-01-07 MED ORDER — PREDNISONE 20 MG PO TABS
60.0000 mg | ORAL_TABLET | Freq: Once | ORAL | Status: AC
  Administered 2013-01-07: 60 mg via ORAL
  Filled 2013-01-07: qty 3

## 2013-01-07 MED ORDER — CETIRIZINE-PSEUDOEPHEDRINE ER 5-120 MG PO TB12: 1.0000 | ORAL_TABLET | Freq: Two times a day (BID) | ORAL | Status: DC

## 2013-01-07 MED ORDER — GUAIFENESIN ER 600 MG PO TB12: 1200.0000 mg | ORAL_TABLET | Freq: Two times a day (BID) | ORAL | Status: DC

## 2013-01-07 NOTE — ED Notes (Signed)
Presents with onset of sore throat, nasal congestion, headache that began this am. No redness noted to throat. Pt alert and orients. Breath sounds clear

## 2013-01-07 NOTE — ED Provider Notes (Signed)
CSN: [id number]     Arrival date & time 01/07/13  1958 History     First MD Initiated Contact with Patient 01/07/13 2039     Chief Complaint  Patient presents with  . Sore Throat   HPI  History provided by the patient. Patient is a 32 year old female with no significant PMH who presents with concerns of nasal congestion, sinus pressure and throat irritation. Symptoms began earlier this morning with increasing pressure throughout the day. She also complains of associated headache and head pressure with throbbing. Patient reports having some similar symptoms in the past and feels that her "uvula is choking her". 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 Symptoms are described as mild. She states she was treated with prednisone in the past her symptoms and they cleared up immediately. She is not taking any allergy medications at this time. She did take one generic Mucinex tablet earlier in the day without any change in symptoms. She also took one Tylenol for her headache with some improvement. She denies any vision change. No fever, chills or sweats. No weakness or numbness in extremities. No other aggravating or alleviating factors. No other associated symptoms.    History reviewed. No pertinent past medical history. Past Surgical History  Procedure Laterality Date  . Cesarean section    . Cesarean section     History reviewed. No pertinent family history. History  Substance Use Topics  . Smoking status: Current Every Day Smoker  . Smokeless tobacco: Not on file  . Alcohol Use: Yes   OB History   Grav Para Term Preterm Abortions TAB SAB Ect Mult Living                 Review of Systems  Constitutional: Negative for fever, chills and diaphoresis.  HENT: Positive for sore throat and sinus pressure. Negative for trouble swallowing.   Respiratory: Negative for cough, shortness of breath, wheezing and  stridor.   Gastrointestinal: Negative for vomiting, abdominal pain and diarrhea.  All other systems reviewed and are negative.    Allergies  Penicillins and Red dye  Home Medications   Current Outpatient Rx  Name  Route  Sig  Dispense  Refill  . etonogestrel (IMPLANON) 68 MG IMPL implant   Subcutaneous   Inject 1 each into the skin once.         . GuaiFENesin (MUCINEX PO)   Oral   Take 2 tablets by mouth once.         . cetirizine-pseudoephedrine (ZYRTEC-D) 5-120 MG per tablet   Oral   Take 1 tablet by mouth 2 (two) times daily.   30 tablet   0   . fluticasone (FLONASE) 50 MCG/ACT nasal spray   Nasal   Place 2 sprays into the nose daily.   16 g   0   . guaiFENesin (MUCINEX) 600 MG 12 hr tablet   Oral   Take 2 tablets (1,200 mg total) by mouth 2 (two) times daily.   60 tablet   0    BP 138/81  Pulse 117  Temp(Src) 98.5 F (36.9 C) (Oral)  Resp 18  SpO2 100% Physical Exam  Nursing note and vitals reviewed. Constitutional: She is oriented to person, place, and time. She appears well-developed and well-nourished. No distress.  HENT:  Head: Normocephalic.  Right Ear: Tympanic membrane normal.  Left Ear: Tympanic membrane normal.  Mouth/Throat: Oropharynx is  clear and moist.  No significant amounts of edema to the uvula. Uvula midline. Tonsils normal in size without erythema or exudate.  Eyes: Conjunctivae are normal.  Neck: Normal range of motion. Neck supple. No tracheal deviation present. No thyromegaly present.  Cardiovascular: Normal rate and regular rhythm.   No murmur heard. Pulmonary/Chest: Effort normal and breath sounds normal. No stridor. No respiratory distress. She has no wheezes. She has no rales.  Abdominal: Soft.  Musculoskeletal: Normal range of motion.  Neurological: She is alert and oriented to person, place, and time.  Skin: Skin is warm and dry. No rash noted.  Psychiatric: She has a normal mood and affect. Her behavior is normal.     ED Course   Procedures   Results for orders placed during the hospital encounter of 01/07/13  RAPID STREP SCREEN      Result Value Range   Streptococcus, Group A Screen (Direct) NEGATIVE  NEGATIVE      1. Sinus congestion     MDM  8:45PM patient seen and evaluated. Patient appears well no acute distress. She does not appear severely ill or toxic.  Patient with slight tachycardia at triage however at rest heart rate normalized. Have agreed to give one dose of prednisone here. There is no significant findings for angioedema or other swelling in the oropharynx. Will treat her congestion and allergy symptoms with Zyrtec-D, Flonase, and Mucinex. Patient instructed to followup with the primary care provider or [facility] specialist.  Angus Seller, PA-C 01/07/13 2102

## 2013-01-08 NOTE — ED Provider Notes (Signed)
Medical screening examination/treatment/procedure(s) were performed by non-physician practitioner and as supervising physician I was immediately available for consultation/collaboration.  Geoffery Lyons, MD 01/08/13 [phone]

## 2013-01-09 LAB — CULTURE, GROUP A STREP

## 2013-06-28 IMAGING — CR DG CHEST 2V
2 series · 2 of 2 positions shown · non-contrast
Comparison: 10/27/2009.

CLINICAL DATA: Left chest pain.

CHEST - 2 VIEW

[w chest pa]
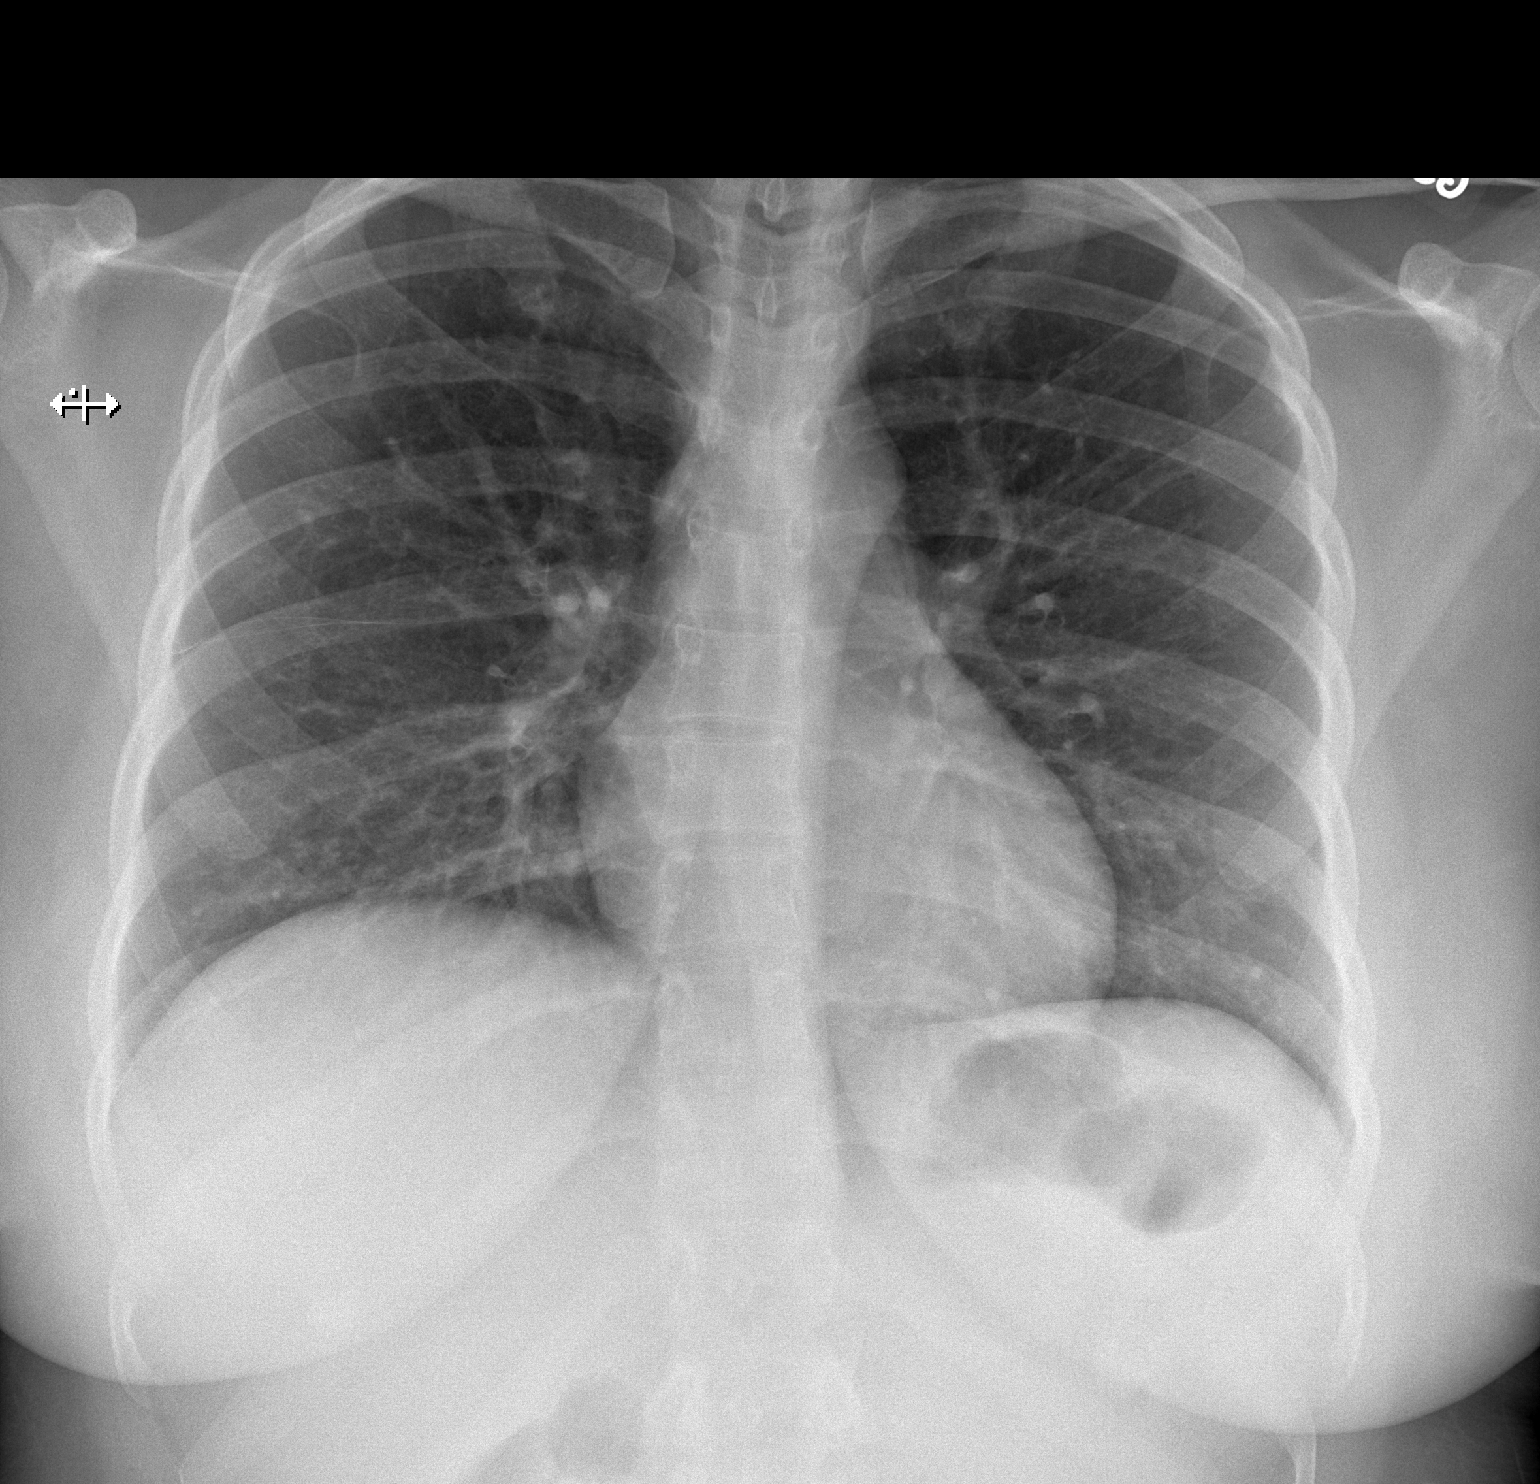

[w chest lat]
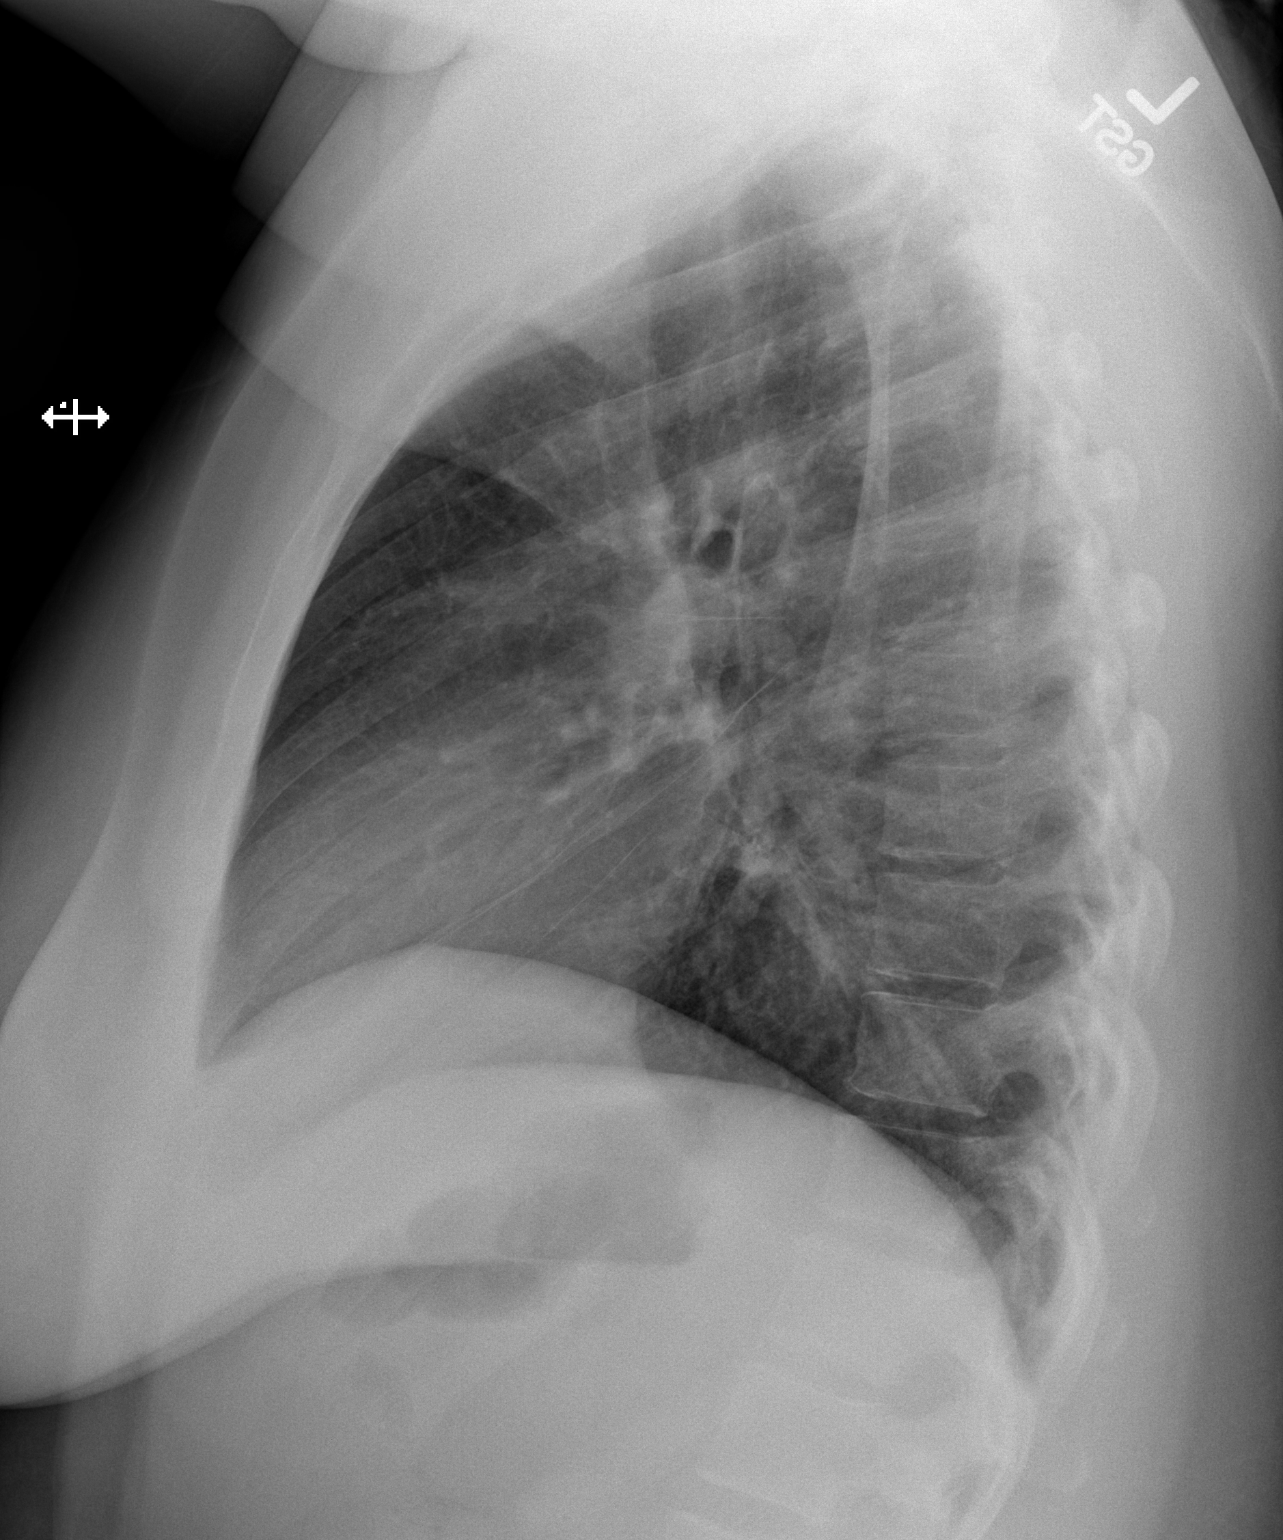

[2 of 2 positions shown; findings below may reference images not displayed]

FINDINGS: Normal sized heart.  Clear lungs.  Minimal central
peribronchial thickening with little change.  Mild scoliosis.
IMPRESSION: Minimal chronic bronchitic changes.

## 2013-10-21 ENCOUNTER — Emergency Department (HOSPITAL_COMMUNITY)
Admission: EM | Admit: 2013-10-21 | Discharge: 2013-10-21 | Disposition: A | Discharge status: Home / Self Care | Payer: BC Managed Care – PPO | Attending: Emergency Medicine | Admitting: Emergency Medicine

## 2013-10-21 ENCOUNTER — Encounter (HOSPITAL_COMMUNITY): Payer: Self-pay | Admitting: Emergency Medicine

## 2013-10-21 DIAGNOSIS — R000 Tachycardia, unspecified: Secondary | ICD-10-CM | POA: Insufficient documentation

## 2013-10-21 DIAGNOSIS — Z79899 Other long term (current) drug therapy: Secondary | ICD-10-CM | POA: Insufficient documentation

## 2013-10-21 DIAGNOSIS — IMO0001 Reserved for inherently not codable concepts without codable children: Secondary | ICD-10-CM | POA: Insufficient documentation

## 2013-10-21 DIAGNOSIS — Z880 Allergy status to penicillin: Secondary | ICD-10-CM | POA: Insufficient documentation

## 2013-10-21 DIAGNOSIS — F17200 Nicotine dependence, unspecified, uncomplicated: Secondary | ICD-10-CM | POA: Insufficient documentation

## 2013-10-21 DIAGNOSIS — N12 Tubulo-interstitial nephritis, not specified as acute or chronic: Secondary | ICD-10-CM | POA: Insufficient documentation

## 2013-10-21 DIAGNOSIS — Z3202 Encounter for pregnancy test, result negative: Secondary | ICD-10-CM | POA: Insufficient documentation

## 2013-10-21 DIAGNOSIS — IMO0002 Reserved for concepts with insufficient information to code with codable children: Secondary | ICD-10-CM | POA: Insufficient documentation

## 2013-10-21 LAB — I-STAT CHEM 8, ED
BUN: 9 mg/dL (ref 6–23)
CALCIUM ION: 1.21 mmol/L (ref 1.12–1.23)
CHLORIDE: 100 meq/L (ref 96–112)
CREATININE: 1 mg/dL (ref 0.50–1.10)
GLUCOSE: 82 mg/dL (ref 70–99)
HEMATOCRIT: 41 % (ref 36.0–46.0)
HEMOGLOBIN: 13.9 g/dL (ref 12.0–15.0)
POTASSIUM: 3.3 meq/L — AB (ref 3.7–5.3)
Sodium: 139 mEq/L (ref 137–147)
TCO2: 27 mmol/L (ref 0–100)

## 2013-10-21 LAB — URINALYSIS, ROUTINE W REFLEX MICROSCOPIC
BILIRUBIN URINE: NEGATIVE
Glucose, UA: NEGATIVE mg/dL
KETONES UR: NEGATIVE mg/dL
NITRITE: NEGATIVE
PH: 6 (ref 5.0–8.0)
PROTEIN: 30 mg/dL — AB
Specific Gravity, Urine: 1.015 (ref 1.005–1.030)
Urobilinogen, UA: 1 mg/dL (ref 0.0–1.0)

## 2013-10-21 LAB — URINE MICROSCOPIC-ADD ON

## 2013-10-21 LAB — POC URINE PREG, ED: Preg Test, Ur: NEGATIVE

## 2013-10-21 MED ORDER — OXYCODONE-ACETAMINOPHEN 5-325 MG PO TABS
2.0000 | ORAL_TABLET | ORAL | Status: DC | PRN
Start: 2013-10-21 — End: 2014-11-14

## 2013-10-21 MED ORDER — SULFAMETHOXAZOLE-TMP DS 800-160 MG PO TABS: 1.0000 | ORAL_TABLET | Freq: Two times a day (BID) | ORAL | Status: DC

## 2013-10-21 MED ORDER — OXYCODONE-ACETAMINOPHEN 5-325 MG PO TABS
1.0000 | ORAL_TABLET | Freq: Once | ORAL | Status: AC
  Administered 2013-10-21: 1 via ORAL
  Filled 2013-10-21: qty 1

## 2013-10-21 MED ORDER — SULFAMETHOXAZOLE-TMP DS 800-160 MG PO TABS
1.0000 | ORAL_TABLET | Freq: Once | ORAL | Status: AC
  Administered 2013-10-21: 1 via ORAL
  Filled 2013-10-21: qty 1

## 2013-10-21 NOTE — ED Notes (Signed)
Pt alert, arrives from work, c/o flank pain, onset was yesterday, denies changes in bowel or bladder, denies bleeding or discharge, resp even unlabored, skin pwd

## 2013-10-21 NOTE — ED Provider Notes (Signed)
Medical screening examination/treatment/procedure(s) were performed by non-physician practitioner and as supervising physician I was immediately available for consultation/collaboration.   EKG Interpretation None       Juliet Rude. Rubin Payor, MD 10/21/13 2326

## 2013-10-21 NOTE — ED Notes (Signed)
Pt aware urine sample is needed, unable at this time 

## 2013-10-21 NOTE — Discharge Instructions (Signed)

## 2013-10-21 NOTE — ED Provider Notes (Signed)
CSN: [id number]     Arrival date & time 10/21/13  1606 History   First MD Initiated Contact with Patient 10/21/13 1621     Chief Complaint  Patient presents with  . Flank Pain     (Consider location/radiation/quality/duration/timing/severity/associated sxs/prior Treatment) HPI  33 year old female presents complaining of flank pain. Patient reports for the past week she noticed that she is urinating more than usual. Yesterday while walking she went to urinate and afterwards experiencing severe low abnormal pain. Pain has been waxing and waning improved with taking Tylenol and ibuprofen at home however today pain since radiates to her right flank and has been persistent. Ibuprofen has not helped. She noticed that her urine color is darker than usual. She also endorsed fever and chills with body aches. She denies headache, nasal congestion, runny nose, sneezing, cough, chest pain, shortness of breath, back pain, burning with urination, vaginal discharge, vaginal bleeding, hematuria, pain with sexual activity, or rash. She is sexually active with one partner. No prior history of STD. No prior history of kidney stone. Patient has no other complaint.  History reviewed. No pertinent past medical history. Past Surgical History  Procedure Laterality Date  . Cesarean section    . Cesarean section     No family history on file. History  Substance Use Topics  . Smoking status: Current Every Day Smoker  . Smokeless tobacco: Not on file  . Alcohol Use: Yes   OB History   Grav Para Term Preterm Abortions TAB SAB Ect Mult Living                 Review of Systems  All other systems reviewed and are negative.     Allergies  Red dye and Penicillins  Home Medications   Prior to Admission medications   Medication Sig Start Date End Date Taking? Authorizing Provider  etonogestrel (IMPLANON) 68 MG IMPL implant Inject 1 each into the skin once.    Historical Provider, MD  fluticasone (FLONASE)  50 MCG/ACT nasal spray Place 2 sprays into the nose daily. 01/07/13   Phill Mutter Dammen, PA-C  GuaiFENesin (MUCINEX PO) Take 2 tablets by mouth once.    Historical Provider, MD  guaiFENesin (MUCINEX) 600 MG 12 hr tablet Take 2 tablets (1,200 mg total) by mouth 2 (two) times daily. 01/07/13   Phill Mutter Dammen, PA-C   BP 137/73  Pulse 123  Temp(Src) 100.2 F (37.9 C)  Resp 20  SpO2 100%  LMP 09/13/2013 Physical Exam  Nursing note and vitals reviewed. Constitutional: She appears well-developed and well-nourished. No distress.  Moderately obese African American female appears to be in no acute distress.  HENT:  Head: Atraumatic.  Mouth/Throat: Oropharynx is clear and moist.  Eyes: Conjunctivae are normal.  Neck: Neck supple.  Cardiovascular:  Tachycardia without murmurs rubs without  Pulmonary/Chest: Effort normal and breath sounds normal. She has no wheezes. She exhibits no tenderness.  Abdominal: Soft. There is tenderness (Mild suprapubic tenderness on palpation without guarding rebound tenderness. No pain at McBurney's point, negative Murphy's sign ).  Genitourinary:  No CVA tenderness  Neurological: She is alert.  Skin: No rash noted.  Psychiatric: She has a normal mood and affect.    ED Course  Procedures (including critical care time)  Patient with urinary frequency, and suprapubic abdominal pain with flank pain. She has no CVA tenderness suggestive of kidney stone. She does have an elevated temp of 100.2, and is tachycardic. Otherwise she is well appearing. Workup initiated.  6:49 PM UA positive for UTI, early pyelonephritis given elevated temp, and mild tachycardia with flank pain but not reproducible.  Will give Septra.  Pt tolerates PO.  At this time i felt pt stable for discharge.  I have low suspicion for biliary disease, appendicitis, colitis or other emergent medical condition.  However pt instruct to return if sxs worsen despite taking abx, or having persistent fever,  persistent vomiting or if she has other concerns.    Labs Review Labs Reviewed  URINALYSIS, ROUTINE W REFLEX MICROSCOPIC - Abnormal; Notable for the following:    APPearance CLOUDY (*)    Hgb urine dipstick SMALL (*)    Protein, ur 30 (*)    Leukocytes, UA LARGE (*)    All other components within normal limits  URINE MICROSCOPIC-ADD ON - Abnormal; Notable for the following:    Bacteria, UA FEW (*)    All other components within normal limits  I-STAT CHEM 8, ED - Abnormal; Notable for the following:    Potassium 3.3 (*)    All other components within normal limits  POC URINE PREG, ED    Imaging Review No results found.   EKG Interpretation None      MDM   Final diagnoses:  Pyelonephritis    BP 123/61  Pulse 111  Temp(Src) 100 F (37.8 C) (Oral)  Resp 16  SpO2 100%  LMP 09/13/2013  I have reviewed nursing notes and vital signs. I reviewed available ER/hospitalization records thought the EMR     Fayrene Helper, [place] 10/21/13 1858

## 2014-05-10 ENCOUNTER — Emergency Department (HOSPITAL_COMMUNITY)
Admission: EM | Admit: 2014-05-10 | Discharge: 2014-05-10 | Disposition: A | Discharge status: Home / Self Care | Payer: BC Managed Care – PPO | Attending: Emergency Medicine | Admitting: Emergency Medicine

## 2014-05-10 ENCOUNTER — Encounter (HOSPITAL_COMMUNITY): Payer: Self-pay | Admitting: *Deleted

## 2014-05-10 DIAGNOSIS — Y998 Other external cause status: Secondary | ICD-10-CM | POA: Insufficient documentation

## 2014-05-10 DIAGNOSIS — Z720 Tobacco use: Secondary | ICD-10-CM | POA: Diagnosis not present

## 2014-05-10 DIAGNOSIS — Y9259 Other trade areas as the place of occurrence of the external cause: Secondary | ICD-10-CM | POA: Insufficient documentation

## 2014-05-10 DIAGNOSIS — Z792 Long term (current) use of antibiotics: Secondary | ICD-10-CM | POA: Insufficient documentation

## 2014-05-10 DIAGNOSIS — T781XXA Other adverse food reactions, not elsewhere classified, initial encounter: Secondary | ICD-10-CM | POA: Diagnosis present

## 2014-05-10 DIAGNOSIS — R221 Localized swelling, mass and lump, neck: Secondary | ICD-10-CM | POA: Diagnosis not present

## 2014-05-10 DIAGNOSIS — T7840XA Allergy, unspecified, initial encounter: Secondary | ICD-10-CM

## 2014-05-10 DIAGNOSIS — Z880 Allergy status to penicillin: Secondary | ICD-10-CM | POA: Diagnosis not present

## 2014-05-10 DIAGNOSIS — Y9389 Activity, other specified: Secondary | ICD-10-CM | POA: Diagnosis not present

## 2014-05-10 MED ORDER — FAMOTIDINE 20 MG PO TABS: 20.0000 mg | ORAL_TABLET | Freq: Once | ORAL | Status: DC

## 2014-05-10 MED ORDER — PREDNISONE 20 MG PO TABS
60.0000 mg | ORAL_TABLET | Freq: Once | ORAL | Status: AC
  Administered 2014-05-10: 60 mg via ORAL
  Filled 2014-05-10: qty 3

## 2014-05-10 MED ORDER — PREDNISONE 20 MG PO TABS: 60.0000 mg | ORAL_TABLET | Freq: Every day | ORAL | Status: DC

## 2014-05-10 MED ORDER — FAMOTIDINE 20 MG PO TABS
20.0000 mg | ORAL_TABLET | Freq: Once | ORAL | Status: AC
  Administered 2014-05-10: 20 mg via ORAL
  Filled 2014-05-10: qty 1

## 2014-05-10 NOTE — ED Notes (Signed)
MD at bedside. 

## 2014-05-10 NOTE — ED Notes (Addendum)
States, "allergic reaction; uvula swollen and feels scratchy in back of throat." denies sob; no neck swelling.

## 2014-05-10 NOTE — Discharge Instructions (Signed)
°Emergency Department Resource Guide °1) Find a Doctor and Pay Out of Pocket °Although you won't have to find out who is covered by your insurance plan, it is a good idea to ask around and get recommendations. You will then need to call the office and see if the doctor you have chosen will accept you as a new patient and what types of options they offer for patients who are self-pay. Some doctors offer discounts or will set up payment plans for their patients who do not have insurance, but you will need to ask so you aren't surprised when you get to your appointment. ° °2) Contact Your Local Health Department °Not all health departments have doctors that can see patients for sick visits, but many do, so it is worth a call to see if yours does. If you don't know where your local health department is, you can check in your phone book. The CDC also has a tool to help you locate your state's health department, and many state websites also have listings of all of their local health departments. ° °3) Find a Walk-in Clinic °If your illness is not likely to be very severe or complicated, you may want to try a walk in clinic. These are popping up all over the country in pharmacies, drugstores, and shopping centers. They're usually staffed by nurse practitioners or physician assistants that have been trained to treat common illnesses and complaints. They're usually fairly quick and inexpensive. However, if you have serious medical issues or chronic medical problems, these are probably not your best option. ° °No Primary Care Doctor: °- Call [facility] at  832-8000 - they can help you locate a primary care doctor that  accepts your insurance, provides certain services, etc. °- Physician Referral Service- 1-800-533-3463 ° °Chronic Pain Problems: °Organization         Address  Phone   Notes  °[facility]  (336) 297-2271 Patients need to be referred by their primary care doctor.  ° °Medication  Assistance: °Organization         Address  Phone   Notes  °[place] [facility] [address] °[place], [place] [address] (336) 641-8030 --Must be a resident of [place] °-- Must have NO insurance coverage whatsoever (no Medicaid/ Medicare, etc.) °-- The pt. MUST have a primary care doctor that directs their care regularly and follows them in the community °  °[facility]  (866) 331-1348   °[employer]  (888) 892-1162   ° °Agencies that provide inexpensive medical care: °Organization         Address  Phone   Notes  °[facility]  (336) 832-8035   °[facility]    (336) 832-7272   °[facility] [address] °[place], [place] [address] (336) 832-4777   °[facility] [address], °[place] (336) 271-4999   °[facility]    (336) 373-0678   °[facility]    (336) 272-1050   °[facility] ° [address], [place] Phone:  (336) 832-4444, Fax:  (336) 832-4440 Hours of Operation:  9 am - 6 pm, M-F.  Also accepts Medicaid/Medicare and self-pay.  °[facility] ° [address],  Phone: (336) 832-3150, Fax: (336) [phone]. Hours of Operation:  8:30 am - 5:30 pm, M-F.  Also accepts Medicaid and self-pay.  °[facility] [place] [address], [place] Phone: (336) 878-6027   °[facility] [address], [place], [place] (336)723-1848, Ext. 123 Mondays & Thursdays: 7-9 AM.  First 15 patients are seen on a first come, first serve basis. °  ° °Medicaid-accepting [place] Providers: ° °Organization         Address  Phone   Notes  °[facility] [address], [place] (336) 641-2100 Also accepts self-pay patients.  °[facility] [address], [place] ° (336) 856-9996   °[facility] [address], [place]  (336) 288-8857   °[facility] [address] [place] [address], [place] (336) 299-7000   °Veita Bland [address], [place]  ° (336) 373-1557 Only accepts [place] Access Medicaid patients after they have their name applied to their card.  ° °Self-Pay (no insurance) in [place]: ° °Organization         Address  Phone   Notes  °Sickle Cell Patients, [facility] [address], [place] (336) 832-1970   °[facility] [address], [place] (336) 832-4400   °[facility] ° [address], [place] (336) 992-4800   °[facility]/Dr. Osei-Bonsu ° [address] [place] [address], [place] or [address], [place] (336) [phone] Phone number for both [place] and [place] locations is the same.  °[facility] [address], [place] (336) 299-0000   °[facility] [address] [place] [address], [place] or [address] (336) 852-7530 °(336) 878-2260   °[facility] [address], [place] (336) 350-1642, phone; (336) 294-5005, fax Sees patients 1st and 3rd Saturday of every month.  Must not qualify for public or private insurance (i.e. Medicaid, Medicare, [place] Health Choice, Veterans' Benefits) • Household income should be no more than 200% of the poverty level •The clinic cannot treat you if you are pregnant or think you are pregnant • Sexually transmitted diseases are not treated at the clinic.  ° ° °Dental Care: °Organization         Address  Phone  Notes  °[place] [facility] [address], [place] (336) 641-6152 Accepts children up to age 21 who are enrolled in Medicaid or [place] Health Choice; pregnant women with a Medicaid card; and children who have applied for Medicaid or [place] Health Choice, but were declined, whose parents can pay a reduced fee at time of service.  °[place]  [facility] [place]  [address], [place] (336) 641-7733 Accepts children up to age 21 who are enrolled in Medicaid or [place] Health Choice; pregnant women with a Medicaid card; and children who have applied for Medicaid or [place] Health Choice, but were declined, whose parents can pay a reduced fee at time of service.  °[facility] ° [address], [place] (336) 641-4533 Patients are seen by appointment only. Walk-ins are not accepted. [facility] will see patients 18 years of age and older. °Monday - Tuesday (8am-5pm) °Most Wednesdays (8:30-5pm) °$30 per visit, cash only  °[facility] ° [address], [place] (336) 641-4533 Patients are seen by appointment only. Walk-ins are not accepted. [facility] will see patients 18 years of age and older. °One   Wednesday Evening (Monthly: Volunteer Based).  $30 per visit, cash only  °[facility]  (919) 537-3737 for adults; Children under age 4, call Graduate Pediatric Dentistry at (919) [phone]. Children aged 4-14, please call (919) [phone] to request a pediatric application. ° Dental services are provided in all areas of dental care including fillings, crowns and bridges, complete and partial dentures, implants, gum treatment, root canals, and extractions. Preventive care is also provided. Treatment is provided to both adults and children. °Patients are selected via a lottery and there is often a waiting list. °  °[facility] [address], °[place] ° (336) 763-8833 [email or web address] °  °[facility] [address], [place], [place] (336)723-1848, Ext. 123 Second and Fourth Thursday of each month, opens at 6:30 AM; Clinic ends at 9 AM.  Patients are seen on a first-come first-served basis, and a limited number are seen during each clinic.  ° °[facility] ° [address], [place], [place] (336) 723-7904    Eligibility Requirements °You must have lived in [place], [place], or [place] counties for at least the last three months. °  You cannot be eligible for state or federal sponsored healthcare insurance, including Veterans Administration, Medicaid, or Medicare. °  You generally cannot be eligible for healthcare insurance through your employer.  °  How to apply: °Eligibility screenings are held every Tuesday and Wednesday afternoon from 1:00 pm until 4:00 pm. You do not need an appointment for the interview!  °[facility] [address], [place], [place] 336-631-2330   °[facility]  336-342-8273   °[facility]  336-703-3100   °[facility]  336-570-6415   ° °Behavioral Health Resources in the Community: °Intensive Outpatient Programs °Organization         Address  Phone  Notes  °[facility] [address], [place], [place] 336-878-6098   °[facility] [address], [place], [place] 336-832-9800   °[facility] [address], [place], [place] ° 336-882-2125   °[facility] [address],  °[place], [place] 1-800-853-5163 or 336-641-4981   °Substance Abuse Resources °Organization         Address  Phone  Notes  °[facility]  336-882-2125   °[facility]  336-784-9470   °[facility]  336-285-9073   °[facility]  336-845-3988   °Residential & Outpatient Substance Abuse Program  1-800-659-3381   °Psychological Services °Organization         Address  Phone  Notes  °[facility]  336- 832-9600   °[facility]  336- 378-7881   °[facility] [address], [place] 1-800-853-5163 or 336-641-4981   ° °Mobile Crisis Teams °Organization         Address  Phone  Notes  °[facility], Mobile Crisis Care Unit  1-877-626-1772   °Assertive °[facility] ° [address]  [place], [place] 336-834-9664   °Sharon DeEsch [address] °[place] 336-554-5454   ° °Self-Help/Support Groups °Organization         Address  Phone             Notes  °[facility]  - variety of support groups  336- 373-1402 Call for more information  °Narcotics Anonymous (NA), [facility] [address], °[place]  2 meetings at this location  ° °  Residential Treatment Programs °Organization         Address  Phone  Notes  °[facility] [address],    °[place]  1-866-801-8205   °[facility] ° [address], [place], [place] 704-293-8524   °[facility] [address], [place] 336-845-3988 Admissions: 8am-3pm M-F  °[facility] [address]    °[place], [place] 336-841-1104   °[facility] [address], [place], [place] 336-379-7146   °[facility] [address]  °[place], [place] 336-285-9073   °[facility] - Intensive Outpatient [address], [place], [place] 336-852-3033   °[facility] [address]  °[place], [place] 1-877-615-2722 or 336-784-9470   °[facility] [address] [place], [place] 336-227-7417 Accepts Medicaid  °[facility] [address]  °[place] 1-800-659-3381 Substance Abuse/Addiction Treatment  ° °[place] Behavioral Health Resources °Organization         Address  Phone  Notes  °[facility]  (888) 581-9988   °Julie Brannon, PhD [address] [place], [place]   (336) 349-5553 or (336) 951-0000   °[facility]   [address] °[place], [place] (336) 349-4454   °[facility] [address], [place], [place] (336) 342-8316 Insurance/Medicaid/sponsorship through [facility]  °[facility] [address]                                    [place], [place] (336) 342-8316 Therapy/tele-psych/case    °[facility] [address]  ° [place], [place] (336) 349-2233    °Dr. Arfeen  (336) 349-4544   °[facility] [place]  [employer] [place] [facility] 1) [address], [place] °2) [address], [place] °3)  [address], [place] (336) 349-3220 °(336) 342-7768 ° °(336) 342-8140   °[place] Child Abuse Hotline (336) 342-1394 or (336) 342-3537 (After Hours)    ° ° °

## 2014-05-10 NOTE — ED Provider Notes (Signed)
CSN: [id number]     Arrival date & time 05/10/14  3419 History   First MD Initiated Contact with Patient 05/10/14 [phone]     Chief Complaint  Patient presents with  . Allergic Reaction     (Consider location/radiation/quality/duration/timing/severity/associated sxs/prior Treatment) Patient is a 33 y.o. female presenting with allergic reaction. The history is provided by the patient.  Allergic Reaction Presenting symptoms: swelling (back of her throat)   Presenting symptoms: no difficulty breathing and no difficulty swallowing   Swelling:    Location:  Mouth   Onset quality:  Gradual   Timing:  Constant   Progression:  Unchanged   Chronicity:  Recurrent Severity:  Mild Prior allergic episodes:  Food/nut allergies Context: food (went to [employer] last night)   Relieved by:  Nothing Worsened by:  Nothing tried Ineffective treatments:  None tried   History reviewed. No pertinent past medical history. Past Surgical History  Procedure Laterality Date  . Cesarean section    . Cesarean section     History reviewed. No pertinent family history. History  Substance Use Topics  . Smoking status: Current Every Day Smoker  . Smokeless tobacco: Not on file  . Alcohol Use: Yes   OB History    No data available     Review of Systems  Constitutional: Negative for fever.  HENT: Negative for trouble swallowing.   Respiratory: Negative for cough and shortness of breath.   Gastrointestinal: Negative for vomiting and abdominal pain.  All other systems reviewed and are negative.     Allergies  Red dye and Penicillins  Home Medications   Prior to Admission medications   Medication Sig Start Date End Date Taking? Authorizing Provider  etonogestrel (IMPLANON) 68 MG IMPL implant Inject 1 each into the skin once.    Historical Provider, MD  famotidine (PEPCID) 20 MG tablet Take 1 tablet (20 mg total) by mouth once. 05/10/14   Elwin Mocha, MD  naproxen sodium (ANAPROX) 220 MG  tablet Take 880 mg by mouth once.    Historical Provider, MD  oxyCODONE-acetaminophen (PERCOCET) 5-325 MG per tablet Take 2 tablets by mouth every 4 (four) hours as needed. 10/21/13   Fayrene Helper, PA-C  predniSONE (DELTASONE) 20 MG tablet Take 3 tablets (60 mg total) by mouth daily. 05/10/14   Elwin Mocha, MD  sulfamethoxazole-trimethoprim (BACTRIM DS) 800-160 MG per tablet Take 1 tablet by mouth 2 (two) times daily. 10/21/13   Fayrene Helper, PA-C   BP 126/75 mmHg  Pulse 92  Temp(Src) 98.6 F (37 C) (Oral)  Resp 18  SpO2 100% Physical Exam  Constitutional: She is oriented to person, place, and time. She appears well-developed and well-nourished. No distress.  HENT:  Head: Normocephalic and atraumatic.  Mouth/Throat: Oropharynx is clear and moist.  Eyes: EOM are normal. Pupils are equal, round, and reactive to light.  Neck: Normal range of motion. Neck supple. No tracheal deviation present. No thyromegaly present.  Cardiovascular: Normal rate and regular rhythm.  Exam reveals no friction rub.   No murmur heard. Pulmonary/Chest: Effort normal and breath sounds normal. No respiratory distress. She has no wheezes. She has no rales.  Abdominal: Soft. She exhibits no distension. There is no tenderness. There is no rebound.  Musculoskeletal: Normal range of motion. She exhibits no edema.  Neurological: She is alert and oriented to person, place, and time.  Skin: No rash noted. She is not diaphoretic.  Nursing note and vitals reviewed.   ED Course  Procedures (including critical  care time) Labs Review Labs Reviewed - No data to display  Imaging Review No results found.   EKG Interpretation None      MDM   Final diagnoses:  Allergic reaction, initial encounter    51F presents with possible allergic reaction to food. Had this happen before, usually goes away with prednisone. No hx of requiring Epinephrine. On exam, vitals stable. There is no stridor commerce or distress. She has no  wheezing. No tongue swelling or lip swelling. Uvula not swollen but red. No difficulty swallowing.  Exam not consistent with uvulitis. Likely allergic reaction. She states this is very early and before she's had much worse swelling in her throat. She says is normally goes away with prednisone. I will give her prednisone, no indication for epinephrine at this time. Will observe her, however she refuses to be observed because she has to go to work. Also given H1 blocker. She refused Benadryl because she is going to work. Given strict return precautions and instructed to return immediately if symptoms worsen. Stable for discharge.    Elwin Mocha, MD 05/10/14 [phone]

## 2014-06-29 ENCOUNTER — Telehealth: Payer: Self-pay | Admitting: *Deleted

## 2014-06-29 NOTE — Telephone Encounter (Signed)
Patient is calling regarding a Nexplanon Removal. Patient states it was inserted in 09/2010. It was due to be removed in 09/2013. Patient states she is interested in changing to another birth control. Patient advised that she does have a balance in our office of $374.70 which must be paid in full to be seen. Patient states she does not currently have that. Patient advised she could contact the health department or [facility] or she could call back when she has the money. Patient verbalized understanding.Marland Kitchen

## 2014-08-13 ENCOUNTER — Encounter (HOSPITAL_COMMUNITY): Payer: Self-pay

## 2014-08-13 ENCOUNTER — Emergency Department (INDEPENDENT_AMBULATORY_CARE_PROVIDER_SITE_OTHER)
Admission: EM | Admit: 2014-08-13 | Discharge: 2014-08-13 | Disposition: A | Discharge status: Home / Self Care | Payer: BLUE CROSS/BLUE SHIELD | Source: Home / Self Care | Attending: Emergency Medicine | Admitting: Emergency Medicine

## 2014-08-13 DIAGNOSIS — M25549 Pain in joints of unspecified hand: Secondary | ICD-10-CM

## 2014-08-13 DIAGNOSIS — M79646 Pain in unspecified finger(s): Secondary | ICD-10-CM

## 2014-08-13 MED ORDER — MELOXICAM 15 MG PO TABS: 15.0000 mg | ORAL_TABLET | Freq: Every day | ORAL | Status: DC

## 2014-08-13 NOTE — Discharge Instructions (Signed)
Arthritis, Nonspecific °Arthritis is inflammation of a joint. This usually means pain, redness, warmth or swelling are present. One or more joints may be involved. There are a number of types of arthritis. Your caregiver may not be able to tell what type of arthritis you have right away. °CAUSES  °The most common cause of arthritis is the wear and tear on the joint (osteoarthritis). This causes damage to the cartilage, which can break down over time. The knees, hips, back and neck are most often affected by this type of arthritis. °Other types of arthritis and common causes of joint pain include: °· Sprains and other injuries near the joint. Sometimes minor sprains and injuries cause pain and swelling that develop hours later. °· Rheumatoid arthritis. This affects hands, feet and knees. It usually affects both sides of your body at the same time. It is often associated with chronic ailments, fever, weight loss and general weakness. °· Crystal arthritis. Gout and pseudo gout can cause occasional acute severe pain, redness and swelling in the foot, ankle, or knee. °· Infectious arthritis. Bacteria can get into a joint through a break in overlying skin. This can cause infection of the joint. Bacteria and viruses can also spread through the blood and affect your joints. °· Drug, infectious and allergy reactions. Sometimes joints can become mildly painful and slightly swollen with these types of illnesses. °SYMPTOMS  °· Pain is the main symptom. °· Your joint or joints can also be red, swollen and warm or hot to the touch. °· You may have a fever with certain types of arthritis, or even feel overall ill. °· The joint with arthritis will hurt with movement. Stiffness is present with some types of arthritis. °DIAGNOSIS  °Your caregiver will suspect arthritis based on your description of your symptoms and on your exam. Testing may be needed to find the type of arthritis: °· Blood and sometimes urine tests. °· X-ray tests  and sometimes CT or MRI scans. °· Removal of fluid from the joint (arthrocentesis) is done to check for bacteria, crystals or other causes. Your caregiver (or a specialist) will numb the area over the joint with a local anesthetic, and use a needle to remove joint fluid for examination. This procedure is only minimally uncomfortable. °· Even with these tests, your caregiver may not be able to tell what kind of arthritis you have. Consultation with a specialist (rheumatologist) may be helpful. °TREATMENT  °Your caregiver will discuss with you treatment specific to your type of arthritis. If the specific type cannot be determined, then the following general recommendations may apply. °Treatment of severe joint pain includes: °· Rest. °· Elevation. °· Anti-inflammatory medication (for example, ibuprofen) may be prescribed. Avoiding activities that cause increased pain. °· Only take over-the-counter or prescription medicines for pain and discomfort as recommended by your caregiver. °· Cold packs over an inflamed joint may be used for 10 to 15 minutes every hour. Hot packs sometimes feel better, but do not use overnight. Do not use hot packs if you are diabetic without your caregiver's permission. °· A cortisone shot into arthritic joints may help reduce pain and swelling. °· Any acute arthritis that gets worse over the next 1 to 2 days needs to be looked at to be sure there is no joint infection. °Long-term arthritis treatment involves modifying activities and lifestyle to reduce joint stress jarring. This can include weight loss. Also, exercise is needed to nourish the joint cartilage and remove waste. This helps keep the muscles   around the joint strong. HOME CARE INSTRUCTIONS   Do not take aspirin to relieve pain if gout is suspected. This elevates uric acid levels.  Only take over-the-counter or prescription medicines for pain, discomfort or fever as directed by your caregiver.  Rest the joint as much as  possible.  If your joint is swollen, keep it elevated.  Use crutches if the painful joint is in your leg.  Drinking plenty of fluids may help for certain types of arthritis.  Follow your caregiver's dietary instructions.  Try low-impact exercise such as:  Swimming.  Water aerobics.  Biking.  Walking.  Morning stiffness is often relieved by a warm shower.  Put your joints through regular range-of-motion. SEEK MEDICAL CARE IF:   You do not feel better in 24 hours or are getting worse.  You have side effects to medications, or are not getting better with treatment. SEEK IMMEDIATE MEDICAL CARE IF:   You have a fever.  You develop severe joint pain, swelling or redness.  Many joints are involved and become painful and swollen.  There is severe back pain and/or leg weakness.  You have loss of bowel or bladder control. Document Released: 07/03/2004 Document Revised: 08/18/2011 Document Reviewed: 07/19/2008 [facility] Patient Information 2015 [place], [place]. This information is not intended to replace advice given to you by your health care provider. Make sure you discuss any questions you have with your health care provider.  [facility]: [address] [place] [address]  [phone]  [facility]: [address] [place] [address]   [phone]  [facility]: [address] [place] [address]  [phone]  [place] [facility] : [address] [place] [address] [phone]  [facility]: [address] [place] [address] [phone]  [facility]: [address] [place] [address] [phone]  Dr. Oneal Grout [address] [facility] [place] [address]  [phone]  Dr. Jackie Plum, [facility]. [address] [place], [place] [address]  [phone]    Go to www.goodrx.com to look up your medications. This will give you a list of where you can find your prescriptions at the most affordable prices.   If you have no primary doctor, here are some resources that may be helpful:  Medicaid-accepting [facility] Providers: - [facility]- [address]  [phone];   - [facility]- [address] [place], [address] [phone]  - [facility]- [address] [phone] [facility]- [address]  [phone]  - Renaye Rakers- [address] [phone]  Only accepts [place] patients       after they have her name applied to their card  -Dr. Jackie Plum, [facility]. [address]    [place], [place] [address]  [phone]  Self Pay (no insurance) in [place]: - Sickle Cell Patients: Dr Willey Blade, [facility] [address] [place] [phone]  - [phone]  - Physician Referral Service- [phone]  - [facility]- [address], [place], [phone];  Monday to Friday, 9 a.m. - 7 p.m.; Saturday 9 a.m. to 1 p.m.  -  [facility]- [address] [place], [place] [phone]  - [facility]- [address]      [phone] - [facility]- [address] [phone]  [facility], [address], [place]; [phone]; or [address], [place]; [phone].   [facility] [place], [place]. [address] [place]; [phone]; Monday to Wednesday, 8:30 a.m. - 5 p.m.; Thursday, 8:30 a.m. - 8 p.m.  [facility], [address], [place]; [phone]; Monday to Friday, 8 a.m. - 4:30 p.m.   [facility], [place] [address]  [place], [phone]; first and third Saturday of the month, 9:30 a.m. - 12:30 p.m.  [facility], [address] [place], [phone]; second Saturday of the month, 9 a.m. -noon.  [facility] for children. For information, call [phone]; [phone]; or [phone].  Other agencies that provide inexpensive medical care:     [facility]  [phone]    [facility]  [phone]    [facility]  [phone] [address] [place] [address]    [facility]  [phone]    [facility]  [phone], [phone]; or [phone].  Chronic Pain Problems Contact [facility]  [phone] Patients need to be referred by their primary care doctor.  [facility]  [facility]     [employer]                          [facility]. [address] [place]                       [address]      [address] [place] [address]   [phone] (After Hours)  General Information: Finding a doctor when you do not have health insurance can be tricky. Although you are not limited by an insurance plan, you are of course limited by her finances and how much but he can pay out of pocket.  What are your options if you don't have health insurance?   1) Find a Librarian, academic and Pay Out of Pocket Although you won't have to find out who is covered by your insurance plan, it is a good idea to ask around and get recommendations. You will then need to call the office and see if the doctor you have chosen will accept you as a new patient and what types of options they offer for patients who are self-pay. Some doctors offer discounts or will set up payment plans for their patients who do not have insurance, but you will need to ask so you aren't surprised when you get to your appointment.  2) Contact Your Local Health Department Not all health departments have doctors that can see patients for sick  visits, but many do, so it is worth a call to see if yours does. If you don't know where your local health department is, you can check in your phone book. The CDC also has a tool to help you locate your state's health department, and many state websites also have listings of all of their local health departments.  3) Find a Walk-in Clinic If your illness is not likely to be very severe or complicated, you may want to try a walk in clinic. These are popping up all over the  country in pharmacies, drugstores, and shopping centers. They're usually staffed by nurse practitioners or physician assistants that have been trained to treat common illnesses and complaints. They're usually fairly quick and inexpensive. However, if you have serious medical issues or chronic medical problems, these are probably not your best option

## 2014-08-13 NOTE — ED Provider Notes (Signed)
CSN: [id number]     Arrival date & time 08/13/14  1814 History   First MD Initiated Contact with Patient 08/13/14 1831     Chief Complaint  Patient presents with  . Hand Pain   (Consider location/radiation/quality/duration/timing/severity/associated sxs/prior Treatment) HPI            34 year old female presents complaining of carpal tunnel syndrome pain. She is diagnosed with carpal tunnel syndrome in both of her wrists about 4 years ago. She was treated and she got better. For the past few days now she has pain in her third through fifth fingers of the right hand and in her fifth finger of the left hand. She has difficulty bending her joints of her fingers. He has no numbness or wrist pain. She says she uses her hands a lot. There is never any swelling of her joints. She has no family history of rheumatologic diseases. She has not taken anything for treatment.   History reviewed. No pertinent past medical history. Past Surgical History  Procedure Laterality Date  . Cesarean section    . Cesarean section     No family history on file. History  Substance Use Topics  . Smoking status: Current Every Day Smoker  . Smokeless tobacco: Not on file  . Alcohol Use: Yes   OB History    No data available     Review of Systems  Musculoskeletal: Positive for arthralgias (see HPI).  Neurological: Negative for numbness.  All other systems reviewed and are negative.   Allergies  Red dye and Penicillins  Home Medications   Prior to Admission medications   Medication Sig Start Date End Date Taking? Authorizing Provider  etonogestrel (IMPLANON) 68 MG IMPL implant Inject 1 each into the skin once.    Historical Provider, MD  famotidine (PEPCID) 20 MG tablet Take 1 tablet (20 mg total) by mouth once. 05/10/14   Elwin Mocha, MD  meloxicam (MOBIC) 15 MG tablet Take 1 tablet (15 mg total) by mouth daily. 08/13/14   Adrian Blackwater Todrick Siedschlag, PA-C  naproxen sodium (ANAPROX) 220 MG tablet Take 880 mg by mouth  once.    Historical Provider, MD  oxyCODONE-acetaminophen (PERCOCET) 5-325 MG per tablet Take 2 tablets by mouth every 4 (four) hours as needed. 10/21/13   Fayrene Helper, PA-C  predniSONE (DELTASONE) 20 MG tablet Take 3 tablets (60 mg total) by mouth daily. 05/10/14   Elwin Mocha, MD  sulfamethoxazole-trimethoprim (BACTRIM DS) 800-160 MG per tablet Take 1 tablet by mouth 2 (two) times daily. 10/21/13   Fayrene Helper, PA-C   BP 122/78 mmHg  Pulse 89  Temp(Src) 98.5 F (36.9 C) (Oral)  Resp 18  SpO2 100% Physical Exam  Constitutional: She is oriented to person, place, and time. Vital signs are normal. She appears well-developed and well-nourished. No distress.  HENT:  Head: Normocephalic and atraumatic.  Pulmonary/Chest: Effort normal. No respiratory distress.  Musculoskeletal:       Right wrist: Normal.       Left wrist: Normal.       Right hand: Normal.       Left hand: Normal.  Negative Phalen's test  Neurological: She is alert and oriented to person, place, and time. She has normal strength. Coordination normal.  Skin: Skin is warm and dry. No rash noted. She is not diaphoretic.  Psychiatric: She has a normal mood and affect. Judgment normal.  Nursing note and vitals reviewed.   ED Course  Procedures (including critical care time) Labs Review  Labs Reviewed - No data to display  Imaging Review No results found.   MDM   1. Pain in multiple finger joints    This is more consistent with mild arthritis in her fingers than with carpal tunnel syndrome. Treat with daily low back, follow-up with a primary care physician if no improvement in a couple of weeks   Meds ordered this encounter  Medications  . meloxicam (MOBIC) 15 MG tablet    Sig: Take 1 tablet (15 mg total) by mouth daily.    Dispense:  30 tablet    Refill:  2    Order Specific Question:  Supervising Provider    Answer:  Bradd Canary D [[id number]]       Graylon Good, PA-C 08/13/14 [phone]

## 2014-08-13 NOTE — ED Notes (Signed)
Patient  Complains of bilateral wrist pain Patient did state she was diagnosed with carpal tunnel syndrome Taking OTC tylenol

## 2014-08-21 ENCOUNTER — Emergency Department (INDEPENDENT_AMBULATORY_CARE_PROVIDER_SITE_OTHER): Payer: BLUE CROSS/BLUE SHIELD

## 2014-08-21 ENCOUNTER — Encounter (HOSPITAL_COMMUNITY): Payer: Self-pay

## 2014-08-21 ENCOUNTER — Emergency Department (INDEPENDENT_AMBULATORY_CARE_PROVIDER_SITE_OTHER)
Admission: EM | Admit: 2014-08-21 | Discharge: 2014-08-21 | Disposition: A | Discharge status: Home / Self Care | Payer: BLUE CROSS/BLUE SHIELD | Source: Home / Self Care | Attending: Family Medicine | Admitting: Family Medicine

## 2014-08-21 DIAGNOSIS — M25561 Pain in right knee: Secondary | ICD-10-CM

## 2014-08-21 NOTE — ED Notes (Signed)
Patient c.o pain and swelling in her right knee. Had recent diagnosis of  Carpal tunnel

## 2014-08-21 NOTE — ED Provider Notes (Signed)
CSN: [id number]     Arrival date & time 08/21/14  0915 History   First MD Initiated Contact with Patient 08/21/14 [phone]     Chief Complaint  Patient presents with  . Knee Pain   (Consider location/radiation/quality/duration/timing/severity/associated sxs/prior Treatment) HPI Comments: Works at [employer] Smoker PCP: none No fever/joint swelling. No previous episodes. No reported injury. States only hurts when walking.   Patient is a 34 y.o. female presenting with knee pain. The history is provided by the patient.  Knee Pain Location:  Knee Injury: no   Knee location:  R knee Pain details:    Quality: "feels like my knee is coming apart" Chronicity:  New Dislocation: no   Prior injury to area:  No Relieved by:  Nothing Exacerbated by: ambulation. Ineffective treatments:  Acetaminophen and NSAIDs Associated symptoms comment:  None   History reviewed. No pertinent past medical history. Past Surgical History  Procedure Laterality Date  . Cesarean section    . Cesarean section     History reviewed. No pertinent family history. History  Substance Use Topics  . Smoking status: Current Every Day Smoker  . Smokeless tobacco: Not on file  . Alcohol Use: Yes   OB History    No data available     Review of Systems  All other systems reviewed and are negative.   Allergies  Red dye and Penicillins  Home Medications   Prior to Admission medications   Medication Sig Start Date End Date Taking? Authorizing Provider  etonogestrel (IMPLANON) 68 MG IMPL implant Inject 1 each into the skin once.    Historical Provider, MD  famotidine (PEPCID) 20 MG tablet Take 1 tablet (20 mg total) by mouth once. 05/10/14   Elwin Mocha, MD  meloxicam (MOBIC) 15 MG tablet Take 1 tablet (15 mg total) by mouth daily. 08/13/14   Adrian Blackwater Baker, PA-C  naproxen sodium (ANAPROX) 220 MG tablet Take 880 mg by mouth once.    Historical Provider, MD  oxyCODONE-acetaminophen (PERCOCET) 5-325 MG per tablet  Take 2 tablets by mouth every 4 (four) hours as needed. 10/21/13   Fayrene Helper, PA-C  predniSONE (DELTASONE) 20 MG tablet Take 3 tablets (60 mg total) by mouth daily. 05/10/14   Elwin Mocha, MD  sulfamethoxazole-trimethoprim (BACTRIM DS) 800-160 MG per tablet Take 1 tablet by mouth 2 (two) times daily. 10/21/13   Fayrene Helper, PA-C   BP 104/70 mmHg  Pulse 84  Temp(Src) 98.4 F (36.9 C) (Oral)  Resp 14  SpO2 99%  LMP 07/23/2014 Physical Exam  Constitutional: She is oriented to person, place, and time. She appears well-developed and well-nourished. No distress.  HENT:  Head: Normocephalic and atraumatic.  Eyes: Conjunctivae are normal.  Cardiovascular: Normal rate, regular rhythm and normal heart sounds.   Pulmonary/Chest: Effort normal and breath sounds normal.  Musculoskeletal: Normal range of motion.       Right knee: She exhibits normal range of motion, no swelling, no effusion, no ecchymosis, no deformity, no laceration, no erythema, normal alignment, no LCL laxity, normal patellar mobility, no bony tenderness, normal meniscus and no MCL laxity. Tenderness found. Lateral joint line tenderness noted.       Legs: Outlined area is region of discomfort  Neurological: She is alert and oriented to person, place, and time.  Skin: Skin is warm and dry.  Psychiatric: She has a normal mood and affect. Her behavior is normal.  Nursing note and vitals reviewed.   ED Course  Procedures (including critical care time)  Labs Review Labs Reviewed - No data to display  Imaging Review Dg Knee Complete 4 Views Right  08/21/2014   CLINICAL DATA:  Right knee pain.  No known injury.  EXAM: RIGHT KNEE - COMPLETE 4+ VIEW  COMPARISON:  None.  FINDINGS: No acute fracture, dislocation, or definite knee joint effusion is identified. There is mild medial compartment marginal osteophytosis. No significant joint space narrowing is identified. No lytic or blastic osseous lesion or soft tissue abnormality is  identified.  IMPRESSION: No acute osseous abnormality identified. Mild medial compartment osteoarthrosis.   Electronically Signed   By: Sebastian Ache   On: 08/21/2014 11:34     MDM   1. Right knee pain   Films unremarkable Ice, NSAIDs, tylenol Ace wrap as needed Ortho follow up if no improvement Work note provided    [employer], PA 08/21/14 1146

## 2014-08-21 NOTE — Discharge Instructions (Signed)
Your xrays were normal. I would advise using either tylenol or ibuprofen as directed on packaging and wear Ace wrap and apply ice as needed for comfort. If symptoms persist, please contact the orthopedist listed on your discharge paperwork for follow up.   Knee Pain The knee is the complex joint between your thigh and your lower leg. It is made up of bones, tendons, ligaments, and cartilage. The bones that make up the knee are:  The femur in the thigh.  The tibia and fibula in the lower leg.  The patella or kneecap riding in the groove on the lower femur. CAUSES  Knee pain is a common complaint with many causes. A few of these causes are:  Injury, such as:  A ruptured ligament or tendon injury.  Torn cartilage.  Medical conditions, such as:  Gout  Arthritis  Infections  Overuse, over training, or overdoing a physical activity. Knee pain can be minor or severe. Knee pain can accompany debilitating injury. Minor knee problems often respond well to self-care measures or get well on their own. More serious injuries may need medical intervention or even surgery. SYMPTOMS The knee is complex. Symptoms of knee problems can vary widely. Some of the problems are:  Pain with movement and weight bearing.  Swelling and tenderness.  Buckling of the knee.  Inability to straighten or extend your knee.  Your knee locks and you cannot straighten it.  Warmth and redness with pain and fever.  Deformity or dislocation of the kneecap. DIAGNOSIS  Determining what is wrong may be very straight forward such as when there is an injury. It can also be challenging because of the complexity of the knee. Tests to make a diagnosis may include:  Your caregiver taking a history and doing a physical exam.  Routine X-rays can be used to rule out other problems. X-rays will not reveal a cartilage tear. Some injuries of the knee can be diagnosed by:  Arthroscopy a surgical technique by which a small  video camera is inserted through tiny incisions on the sides of the knee. This procedure is used to examine and repair internal knee joint problems. Tiny instruments can be used during arthroscopy to repair the torn knee cartilage (meniscus).  Arthrography is a radiology technique. A contrast liquid is directly injected into the knee joint. Internal structures of the knee joint then become visible on X-ray film.  An MRI scan is a non X-ray radiology procedure in which magnetic fields and a computer produce two- or three-dimensional images of the inside of the knee. Cartilage tears are often visible using an MRI scanner. MRI scans have largely replaced arthrography in diagnosing cartilage tears of the knee.  Blood work.  Examination of the fluid that helps to lubricate the knee joint (synovial fluid). This is done by taking a sample out using a needle and a syringe. TREATMENT The treatment of knee problems depends on the cause. Some of these treatments are:  Depending on the injury, proper casting, splinting, surgery, or physical therapy care will be needed.  Give yourself adequate recovery time. Do not overuse your joints. If you begin to get sore during workout routines, back off. Slow down or do fewer repetitions.  For repetitive activities such as cycling or running, maintain your strength and nutrition.  Alternate muscle groups. For example, if you are a weight lifter, work the upper body on one day and the lower body the next.  Either tight or weak muscles do not give the  proper support for your knee. Tight or weak muscles do not absorb the stress placed on the knee joint. Keep the muscles surrounding the knee strong.  Take care of mechanical problems.  If you have flat feet, orthotics or special shoes may help. See your caregiver if you need help.  Arch supports, sometimes with wedges on the inner or outer aspect of the heel, can help. These can shift pressure away from the side of  the knee most bothered by osteoarthritis.  A brace called an "unloader" brace also may be used to help ease the pressure on the most arthritic side of the knee.  If your caregiver has prescribed crutches, braces, wraps or ice, use as directed. The acronym for this is PRICE. This means protection, rest, ice, compression, and elevation.  Nonsteroidal anti-inflammatory drugs (NSAIDs), can help relieve pain. But if taken immediately after an injury, they may actually increase swelling. Take NSAIDs with food in your stomach. Stop them if you develop stomach problems. Do not take these if you have a history of ulcers, stomach pain, or bleeding from the bowel. Do not take without your caregiver's approval if you have problems with fluid retention, heart failure, or kidney problems.  For ongoing knee problems, physical therapy may be helpful.  Glucosamine and chondroitin are over-the-counter dietary supplements. Both may help relieve the pain of osteoarthritis in the knee. These medicines are different from the usual anti-inflammatory drugs. Glucosamine may decrease the rate of cartilage destruction.  Injections of a corticosteroid drug into your knee joint may help reduce the symptoms of an arthritis flare-up. They may provide pain relief that lasts a few months. You may have to wait a few months between injections. The injections do have a small increased risk of infection, water retention, and elevated blood sugar levels.  Hyaluronic acid injected into damaged joints may ease pain and provide lubrication. These injections may work by reducing inflammation. A series of shots may give relief for as long as 6 months.  Topical painkillers. Applying certain ointments to your skin may help relieve the pain and stiffness of osteoarthritis. Ask your pharmacist for suggestions. Many over the-counter products are approved for temporary relief of arthritis pain.  In some countries, doctors often prescribe topical  NSAIDs for relief of chronic conditions such as arthritis and tendinitis. A review of treatment with NSAID creams found that they worked as well as oral medications but without the serious side effects. PREVENTION  Maintain a healthy weight. Extra pounds put more strain on your joints.  Get strong, stay limber. Weak muscles are a common cause of knee injuries. Stretching is important. Include flexibility exercises in your workouts.  Be smart about exercise. If you have osteoarthritis, chronic knee pain or recurring injuries, you may need to change the way you exercise. This does not mean you have to stop being active. If your knees ache after jogging or playing basketball, consider switching to swimming, water aerobics, or other low-impact activities, at least for a few days a week. Sometimes limiting high-impact activities will provide relief.  Make sure your shoes fit well. Choose footwear that is right for your sport.  Protect your knees. Use the proper gear for knee-sensitive activities. Use kneepads when playing volleyball or laying carpet. Buckle your seat belt every time you drive. Most shattered kneecaps occur in car accidents.  Rest when you are tired. SEEK MEDICAL CARE IF:  You have knee pain that is continual and does not seem to be getting better.  SEEK IMMEDIATE MEDICAL CARE IF:  Your knee joint feels hot to the touch and you have a high fever. MAKE SURE YOU:   Understand these instructions.  Will watch your condition.  Will get help right away if you are not doing well or get worse. Document Released: 03/23/2007 Document Revised: 08/18/2011 Document Reviewed: 03/23/2007 [facility] Patient Information 2015 [place], [place]. This information is not intended to replace advice given to you by your health care provider. Make sure you discuss any questions you have with your health care provider.

## 2014-09-02 ENCOUNTER — Encounter (HOSPITAL_COMMUNITY): Payer: Self-pay | Admitting: Emergency Medicine

## 2014-09-02 ENCOUNTER — Emergency Department (HOSPITAL_COMMUNITY)
Admission: EM | Admit: 2014-09-02 | Discharge: 2014-09-02 | Disposition: A | Discharge status: Home / Self Care | Payer: BLUE CROSS/BLUE SHIELD | Attending: Emergency Medicine | Admitting: Emergency Medicine

## 2014-09-02 DIAGNOSIS — Z720 Tobacco use: Secondary | ICD-10-CM | POA: Insufficient documentation

## 2014-09-02 DIAGNOSIS — Z792 Long term (current) use of antibiotics: Secondary | ICD-10-CM | POA: Diagnosis not present

## 2014-09-02 DIAGNOSIS — H5710 Ocular pain, unspecified eye: Secondary | ICD-10-CM | POA: Insufficient documentation

## 2014-09-02 DIAGNOSIS — Z7952 Long term (current) use of systemic steroids: Secondary | ICD-10-CM | POA: Diagnosis not present

## 2014-09-02 DIAGNOSIS — R21 Rash and other nonspecific skin eruption: Secondary | ICD-10-CM | POA: Insufficient documentation

## 2014-09-02 DIAGNOSIS — Z880 Allergy status to penicillin: Secondary | ICD-10-CM | POA: Diagnosis not present

## 2014-09-02 DIAGNOSIS — R599 Enlarged lymph nodes, unspecified: Secondary | ICD-10-CM

## 2014-09-02 DIAGNOSIS — Z791 Long term (current) use of non-steroidal anti-inflammatories (NSAID): Secondary | ICD-10-CM | POA: Diagnosis not present

## 2014-09-02 LAB — CBC
HEMATOCRIT: 38.6 % (ref 36.0–46.0)
HEMOGLOBIN: 12.9 g/dL (ref 12.0–15.0)
MCH: 27.4 pg (ref 26.0–34.0)
MCHC: 33.4 g/dL (ref 30.0–36.0)
MCV: 82 fL (ref 78.0–100.0)
PLATELETS: 240 10*3/uL (ref 150–400)
RBC: 4.71 MIL/uL (ref 3.87–5.11)
RDW: 12.6 % (ref 11.5–15.5)
WBC: 4.2 10*3/uL (ref 4.0–10.5)

## 2014-09-02 MED ORDER — IBUPROFEN 200 MG PO TABS
600.0000 mg | ORAL_TABLET | Freq: Once | ORAL | Status: AC
  Administered 2014-09-02: 600 mg via ORAL
  Filled 2014-09-02: qty 3

## 2014-09-02 NOTE — ED Notes (Addendum)
Pt reports hard, tender nodule on left posterior neck and smaller nodule on right posterior neck. Also c/o rash on left arm and bilateral hand pain. Was seen at urgent care recently and was told she had arthritis. No other c/c. Denies fevers, N, V, D, recent sickness.

## 2014-09-02 NOTE — Discharge Instructions (Signed)
Swollen Lymph Nodes  The lymphatic system filters fluid from around cells. It is like a system of blood vessels. These channels carry lymph instead of blood. The lymphatic system is an important part of the immune (disease fighting) system. When people talk about "swollen glands in the neck," they are usually talking about swollen lymph nodes. The lymph nodes are like the little traps for infection. You and your caregiver may be able to feel lymph nodes, especially swollen nodes, in these common areas: the groin (inguinal area), armpits (axilla), and above the clavicle (supraclavicular). You may also feel them in the neck (cervical) and the back of the head just above the hairline (occipital).  Swollen glands occur when there is any condition in which the body responds with an allergic type of reaction. For instance, the glands in the neck can become swollen from insect bites or any type of minor infection on the head. These are very noticeable in children with only minor problems. Lymph nodes may also become swollen when there is a tumor or problem with the lymphatic system, such as Hodgkin's disease.  TREATMENT    Most swollen glands do not require treatment. They can be observed (watched) for a short period of time, if your caregiver feels it is necessary. Most of the time, observation is not necessary.   Antibiotics (medicines that kill germs) may be prescribed by your caregiver. Your caregiver may prescribe these if he or she feels the swollen glands are due to a bacterial (germ) infection. Antibiotics are not used if the swollen glands are caused by a virus.  HOME CARE INSTRUCTIONS    Take medications as directed by your caregiver. Only take over-the-counter or prescription medicines for pain, discomfort, or fever as directed by your caregiver.  SEEK MEDICAL CARE IF:    If you begin to run a temperature greater than 102 F (38.9 C), or as your caregiver suggests.  MAKE SURE YOU:    Understand these  instructions.   Will watch your condition.   Will get help right away if you are not doing well or get worse.  Document Released: 05/16/2002 Document Revised: 08/18/2011 Document Reviewed: 05/26/2005  [employer] Patient Information 2015 [employer]. This information is not intended to replace advice given to you by your health care provider. Make sure you discuss any questions you have with your health care provider.

## 2014-09-02 NOTE — ED Notes (Signed)
Bed: WTR6 Expected date:  Expected time:  Means of arrival:  Comments: 

## 2014-09-02 NOTE — ED Provider Notes (Signed)
CSN: [id number]     Arrival date & time 09/02/14  2203 History  This chart was scribed for Earley Favor, NP, working with Raeford Razor, MD by Chestine Spore, ED Scribe. The patient was seen in room WTR6/WTR6 at 10:20 PM.    Chief Complaint  Patient presents with  . Lymphadenopathy  . Rash      The history is provided by the patient. No language interpreter was used.    HPI Comments: Catherine LERETTE is a 34 y.o. female who presents to the Emergency Department complaining of lympadenopathy onset this morning. Pt denies any sore spots on her head. Pt does have seasonal allergies. Pt states that she had her braids put in 2 months ago and hasn't had anything new. Pt also has a rash to her arm that she was seen for at the Urgent Care and it was tingling and itching. She states that she is having associated symptoms of eye pain.  She states that she has tried hydrocortisone cream, benadryl, and zyrtec with relief for her symptoms. She denies ear pain, cold symptoms, and any other symptoms.   History reviewed. No pertinent past medical history. Past Surgical History  Procedure Laterality Date  . Cesarean section    . Cesarean section     History reviewed. No pertinent family history. History  Substance Use Topics  . Smoking status: Current Every Day Smoker  . Smokeless tobacco: Not on file  . Alcohol Use: Yes   OB History    No data available     Review of Systems  HENT: Negative for congestion, ear pain, rhinorrhea and sore throat.   Eyes: Positive for pain.  Skin: Positive for rash.  All other systems reviewed and are negative.     Allergies  Red dye and Penicillins  Home Medications   Prior to Admission medications   Medication Sig Start Date End Date Taking? Authorizing Provider  etonogestrel (IMPLANON) 68 MG IMPL implant Inject 1 each into the skin once.    Historical Provider, MD  famotidine (PEPCID) 20 MG tablet Take 1 tablet (20 mg total) by mouth once. 05/10/14    Elwin Mocha, MD  meloxicam (MOBIC) 15 MG tablet Take 1 tablet (15 mg total) by mouth daily. 08/13/14   Adrian Blackwater Baker, PA-C  naproxen sodium (ANAPROX) 220 MG tablet Take 880 mg by mouth once.    Historical Provider, MD  oxyCODONE-acetaminophen (PERCOCET) 5-325 MG per tablet Take 2 tablets by mouth every 4 (four) hours as needed. 10/21/13   Fayrene Helper, PA-C  predniSONE (DELTASONE) 20 MG tablet Take 3 tablets (60 mg total) by mouth daily. 05/10/14   Elwin Mocha, MD  sulfamethoxazole-trimethoprim (BACTRIM DS) 800-160 MG per tablet Take 1 tablet by mouth 2 (two) times daily. 10/21/13   Fayrene Helper, PA-C   BP 128/74 mmHg  Pulse 108  Temp(Src) 99 F (37.2 C) (Oral)  Resp 20  SpO2 100%  LMP 07/23/2014  Physical Exam  Constitutional: She is oriented to person, place, and time. She appears well-developed and well-nourished. No distress.  HENT:  Head: Normocephalic and atraumatic.  Eyes: EOM are normal.  Neck: Neck supple. No tracheal deviation present.  Cardiovascular: Normal rate.   Pulmonary/Chest: Effort normal. No respiratory distress.  Musculoskeletal: Normal range of motion.  Lymphadenopathy:    She has cervical adenopathy.  2 cm x 0.5 cm linear string of lymphnodes in the posterior cervical chain.  Neurological: She is alert and oriented to person, place, and time.  Skin:  Skin is warm and dry.  Small scabs on upper left extremity from pt scratching.   Psychiatric: She has a normal mood and affect. Her behavior is normal.  Nursing note and vitals reviewed.   ED Course  Procedures (including critical care time) DIAGNOSTIC STUDIES: Oxygen Saturation is 100% on RA, nl by my interpretation.    COORDINATION OF CARE: 10:25 PM-Discussed treatment plan which includes tylenol, ibuprofen, CBC, f/u if the symptoms persist with pt at bedside and pt agreed to plan.     Labs Review Labs Reviewed  CBC    Imaging Review No results found.   EKG Interpretation None      MDM   Final  diagnoses:  Lymph node enlargement       Earley Favor, NP 09/02/14 [id number]  Paula Libra, MD 09/03/14 [phone]

## 2014-09-09 ENCOUNTER — Emergency Department (HOSPITAL_COMMUNITY): Payer: BLUE CROSS/BLUE SHIELD

## 2014-09-09 ENCOUNTER — Encounter (HOSPITAL_COMMUNITY): Payer: Self-pay | Admitting: Emergency Medicine

## 2014-09-09 ENCOUNTER — Emergency Department (HOSPITAL_COMMUNITY)
Admission: EM | Admit: 2014-09-09 | Discharge: 2014-09-09 | Disposition: A | Discharge status: Home / Self Care | Payer: BLUE CROSS/BLUE SHIELD | Attending: Emergency Medicine | Admitting: Emergency Medicine

## 2014-09-09 DIAGNOSIS — Z880 Allergy status to penicillin: Secondary | ICD-10-CM | POA: Insufficient documentation

## 2014-09-09 DIAGNOSIS — Z3202 Encounter for pregnancy test, result negative: Secondary | ICD-10-CM | POA: Insufficient documentation

## 2014-09-09 DIAGNOSIS — Z792 Long term (current) use of antibiotics: Secondary | ICD-10-CM | POA: Diagnosis not present

## 2014-09-09 DIAGNOSIS — R51 Headache: Secondary | ICD-10-CM | POA: Diagnosis present

## 2014-09-09 DIAGNOSIS — R519 Headache, unspecified: Secondary | ICD-10-CM

## 2014-09-09 DIAGNOSIS — Z720 Tobacco use: Secondary | ICD-10-CM | POA: Diagnosis not present

## 2014-09-09 DIAGNOSIS — Z7952 Long term (current) use of systemic steroids: Secondary | ICD-10-CM | POA: Diagnosis not present

## 2014-09-09 DIAGNOSIS — M1990 Unspecified osteoarthritis, unspecified site: Secondary | ICD-10-CM | POA: Insufficient documentation

## 2014-09-09 HISTORY — DX: Unspecified osteoarthritis, unspecified site: M19.90

## 2014-09-09 LAB — PREGNANCY, URINE: Preg Test, Ur: NEGATIVE

## 2014-09-09 MED ORDER — BUTALBITAL-APAP-CAFFEINE 50-325-40 MG PO TABS: 1.0000 | ORAL_TABLET | Freq: Four times a day (QID) | ORAL | Status: DC | PRN

## 2014-09-09 MED ORDER — METOCLOPRAMIDE HCL 5 MG/ML IJ SOLN
10.0000 mg | Freq: Once | INTRAMUSCULAR | Status: AC
  Administered 2014-09-09: 10 mg via INTRAVENOUS
  Filled 2014-09-09: qty 2

## 2014-09-09 MED ORDER — SODIUM CHLORIDE 0.9 % IV BOLUS (SEPSIS)
1000.0000 mL | Freq: Once | INTRAVENOUS | Status: AC
  Administered 2014-09-09: 1000 mL via INTRAVENOUS

## 2014-09-09 MED ORDER — BUTALBITAL-APAP-CAFFEINE 50-325-40 MG PO TABS: 1.0000 | ORAL_TABLET | Freq: Four times a day (QID) | ORAL | Status: AC | PRN

## 2014-09-09 MED ORDER — DIPHENHYDRAMINE HCL 50 MG/ML IJ SOLN
25.0000 mg | Freq: Once | INTRAMUSCULAR | Status: AC
  Administered 2014-09-09: 25 mg via INTRAVENOUS
  Filled 2014-09-09: qty 1

## 2014-09-09 NOTE — ED Provider Notes (Signed)
CSN: [id number]     Arrival date & time 09/09/14  1018 History   First MD Initiated Contact with Patient 09/09/14 1027     Chief Complaint  Patient presents with  . Headache     (Consider location/radiation/quality/duration/timing/severity/associated sxs/prior Treatment) HPI   34 year old female presents for evaluation of headache.patient reports for the past week she has had a persistent headache. She described headaches as a throbbing sensation sometimes in the forehead sometimes to the side of the head andsometimes behind eyes. Headache seems to be worsening with head movement and with positional change. She endorses occasional nausea with 2 separate bouts of nonbloody nonbilious vomitus with the pain is severe. She has tried numerous over-the-counter medication including ibuprofen, Excedrin headache, and Advil without adequate relief. She reports last week she developed an itchy rash to both of her arms which has resolved after using Benadryl and hydrocortisone cream. She reported having an enlarged lymph node to the neck that has since resolved. She does endorse some mild light sensitivity. She denies any URI symptoms, fever, chills, body aches,no chest pain, short of breath, productive cough, abdominal pain, dysuria, numbness or weakness, or difficulty thinking. Denies history of recurrent headache. No prior history of STDs. No recent tick exposure, or travel through the woods.  Past Medical History  Diagnosis Date  . Arthritis    Past Surgical History  Procedure Laterality Date  . Cesarean section    . Cesarean section     History reviewed. No pertinent family history. History  Substance Use Topics  . Smoking status: Current Every Day Smoker  . Smokeless tobacco: Not on file  . Alcohol Use: Yes   OB History    No data available     Review of Systems  All other systems reviewed and are negative.     Allergies  Red dye and Penicillins  Home Medications   Prior to  Admission medications   Medication Sig Start Date End Date Taking? Authorizing Provider  etonogestrel (IMPLANON) 68 MG IMPL implant Inject 1 each into the skin once.    Historical Provider, MD  famotidine (PEPCID) 20 MG tablet Take 1 tablet (20 mg total) by mouth once. 05/10/14   Elwin Mocha, MD  meloxicam (MOBIC) 15 MG tablet Take 1 tablet (15 mg total) by mouth daily. 08/13/14   Adrian Blackwater Baker, PA-C  naproxen sodium (ANAPROX) 220 MG tablet Take 880 mg by mouth once.    Historical Provider, MD  oxyCODONE-acetaminophen (PERCOCET) 5-325 MG per tablet Take 2 tablets by mouth every 4 (four) hours as needed. 10/21/13   Fayrene Helper, PA-C  predniSONE (DELTASONE) 20 MG tablet Take 3 tablets (60 mg total) by mouth daily. 05/10/14   Elwin Mocha, MD  sulfamethoxazole-trimethoprim (BACTRIM DS) 800-160 MG per tablet Take 1 tablet by mouth 2 (two) times daily. 10/21/13   Fayrene Helper, PA-C   BP 121/77 mmHg  Pulse 112  Temp(Src) 98 F (36.7 C) (Oral)  Resp 16  SpO2 100%  LMP 07/23/2014 Physical Exam  Constitutional: She appears well-developed and well-nourished. No distress.  HENT:  Head: Atraumatic.  Right Ear: External ear normal.  Left Ear: External ear normal.  Mouth/Throat: Oropharynx is clear and moist.  Eyes: Conjunctivae and EOM are normal. Pupils are equal, round, and reactive to light.  Neck: Normal range of motion. Neck supple.  No nuchal rigidity  Cardiovascular: Normal rate and regular rhythm.   Pulmonary/Chest: Effort normal and breath sounds normal.  Abdominal: Soft. There is no tenderness.  Lymphadenopathy:    She has no cervical adenopathy.  Neurological: She is alert.  Neurologic exam:  Speech clear, pupils equal round reactive to light, extraocular movements intact  Normal peripheral visual fields Cranial nerves III through XII normal including no facial droop Follows commands, moves all extremities x4, normal strength to bilateral upper and lower extremities at all major muscle  groups including grip Sensation normal to light touch  Coordination intact, no limb ataxia, finger-nose-finger normal Rapid alternating movements normal No pronator drift Gait normal   Skin: No rash noted.  Psychiatric: She has a normal mood and affect.  Nursing note and vitals reviewed.   ED Course  Procedures (including critical care time)  10:47 AM Patient with persistent headache ongoing for one week. She has no fever or nuchal rigidity concerning for meningitis. She has no focal neuro deficit on exam. She has no other red flags. She does not appear toxic. She reported having a rash however no evidence of rash to bilateral arms on exam. Migraine cocktail given. I have low suspicion for meningitis, syphilis, [facility] spotted fever, Lyme disease, or other acute emergent condition.  Care discussed with Dr. Anitra Lauth  11:58 AM Given the fact that patient has headache ongoing for one week, not improve with OTC and no history of headache in the past. Patient is concerned, I agree head CT scan will be obtained to rule out any significant pathology.  Otherwise, patient is resting comfortably after receiving migraine cocktail.  2:03 PM Headache is mostly resolved, head CT scan is unremarkable, pregnancy test negative. Since patient does not have a primary care provider,  resources provided. Fioricet prescribed for headache. Return precautions discussed. Otherwise patient is stable for discharge.  Labs Review Labs Reviewed  PREGNANCY, URINE  POC URINE PREG, ED    Imaging Review Ct Head Wo Contrast  09/09/2014   CLINICAL DATA:  Headaches  EXAM: CT HEAD WITHOUT CONTRAST  TECHNIQUE: Contiguous axial images were obtained from the base of the skull through the vertex without intravenous contrast.  COMPARISON:  None.  FINDINGS: The bony calvarium is intact. The ventricles are of normal size and configuration. No findings to suggest acute hemorrhage, acute infarction or space-occupying mass  lesion are noted.  IMPRESSION: No acute intracranial abnormality.   Electronically Signed   By: Alcide Clever M.D.   On: 09/09/2014 13:47     EKG Interpretation None      MDM   Final diagnoses:  Headache    BP 103/73 mmHg  Pulse 91  Temp(Src) 98.2 F (36.8 C) (Oral)  Resp 16  SpO2 98%  LMP 07/23/2014  I have reviewed nursing notes and vital signs. I personally reviewed the imaging tests through PACS system  I reviewed available ER/hospitalization records thought the EMR     Fayrene Helper, PA-C 09/09/14 1419  Gwyneth Sprout, MD 09/09/14 1511

## 2014-09-09 NOTE — ED Notes (Signed)
Pt reports HA for past week. No hx of migraines or injury to head. Pain moves around on head. Today it is on her forehead. Has been having some emesis at home. 1 episode emesis in past 24 hours, able to keep down fluids. Sensitivity to light.

## 2014-09-09 NOTE — Discharge Instructions (Signed)
Headaches, Frequently Asked Questions °MIGRAINE HEADACHES °Q: What is migraine? What causes it? How can I treat it? °A: Generally, migraine headaches begin as a dull ache. Then they develop into a constant, throbbing, and pulsating pain. You may experience pain at the temples. You may experience pain at the front or back of one or both sides of the head. The pain is usually accompanied by a combination of: °· Nausea. °· Vomiting. °· Sensitivity to light and noise. °Some people (about 15%) experience an aura (see below) before an attack. The cause of migraine is believed to be chemical reactions in the brain. Treatment for migraine may include over-the-counter or prescription medications. It may also include self-help techniques. These include relaxation training and biofeedback.  °Q: What is an aura? °A: About 15% of people with migraine get an "aura". This is a sign of neurological symptoms that occur before a migraine headache. You may see wavy or jagged lines, dots, or flashing lights. You might experience tunnel vision or blind spots in one or both eyes. The aura can include visual or auditory hallucinations (something imagined). It may include disruptions in smell (such as strange odors), taste or touch. Other symptoms include: °· Numbness. °· A "pins and needles" sensation. °· Difficulty in recalling or speaking the correct word. °These neurological events may last as long as 60 minutes. These symptoms will fade as the headache begins. °Q: What is a trigger? °A: Certain physical or environmental factors can lead to or "trigger" a migraine. These include: °· Foods. °· Hormonal changes. °· Weather. °· Stress. °It is important to remember that triggers are different for everyone. To help prevent migraine attacks, you need to figure out which triggers affect you. Keep a headache diary. This is a good way to track triggers. The diary will help you talk to your healthcare professional about your condition. °Q: Does  weather affect migraines? °A: Bright sunshine, hot, humid conditions, and drastic changes in barometric pressure may lead to, or "trigger," a migraine attack in some people. But studies have shown that weather does not act as a trigger for everyone with migraines. °Q: What is the link between migraine and hormones? °A: Hormones start and regulate many of your body's functions. Hormones keep your body in balance within a constantly changing environment. The levels of hormones in your body are unbalanced at times. Examples are during menstruation, pregnancy, or menopause. That can lead to a migraine attack. In fact, about three quarters of all women with migraine report that their attacks are related to the menstrual cycle.  °Q: Is there an increased risk of stroke for migraine sufferers? °A: The likelihood of a migraine attack causing a stroke is very remote. That is not to say that migraine sufferers cannot have a stroke associated with their migraines. In persons under age 40, the most common associated factor for stroke is migraine headache. But over the course of a person's normal life span, the occurrence of migraine headache may actually be associated with a reduced risk of dying from cerebrovascular disease due to stroke.  °Q: What are acute medications for migraine? °A: Acute medications are used to treat the pain of the headache after it has started. Examples over-the-counter medications, NSAIDs, ergots, and triptans.  °Q: What are the triptans? °A: Triptans are the newest class of abortive medications. They are specifically targeted to treat migraine. Triptans are vasoconstrictors. They moderate some chemical reactions in the brain. The triptans work on receptors in your brain. Triptans help   to restore the balance of a neurotransmitter called serotonin. Fluctuations in levels of serotonin are thought to be a main cause of migraine.  °Q: Are over-the-counter medications for migraine effective? °A:  Over-the-counter, or "OTC," medications may be effective in relieving mild to moderate pain and associated symptoms of migraine. But you should see your caregiver before beginning any treatment regimen for migraine.  °Q: What are preventive medications for migraine? °A: Preventive medications for migraine are sometimes referred to as "prophylactic" treatments. They are used to reduce the frequency, severity, and length of migraine attacks. Examples of preventive medications include antiepileptic medications, antidepressants, beta-blockers, calcium channel blockers, and NSAIDs (nonsteroidal anti-inflammatory drugs). °Q: Why are anticonvulsants used to treat migraine? °A: During the past few years, there has been an increased interest in antiepileptic drugs for the prevention of migraine. They are sometimes referred to as "anticonvulsants". Both epilepsy and migraine may be caused by similar reactions in the brain.  °Q: Why are antidepressants used to treat migraine? °A: Antidepressants are typically used to treat people with depression. They may reduce migraine frequency by regulating chemical levels, such as serotonin, in the brain.  °Q: What alternative therapies are used to treat migraine? °A: The term "alternative therapies" is often used to describe treatments considered outside the scope of conventional Western medicine. Examples of alternative therapy include acupuncture, acupressure, and yoga. Another common alternative treatment is herbal therapy. Some herbs are believed to relieve headache pain. Always discuss alternative therapies with your caregiver before proceeding. Some herbal products contain arsenic and other toxins. °TENSION HEADACHES °Q: What is a tension-type headache? What causes it? How can I treat it? °A: Tension-type headaches occur randomly. They are often the result of temporary stress, anxiety, fatigue, or anger. Symptoms include soreness in your temples, a tightening band-like sensation  around your head (a "vice-like" ache). Symptoms can also include a pulling feeling, pressure sensations, and contracting head and neck muscles. The headache begins in your forehead, temples, or the back of your head and neck. Treatment for tension-type headache may include over-the-counter or prescription medications. Treatment may also include self-help techniques such as relaxation training and biofeedback. °CLUSTER HEADACHES °Q: What is a cluster headache? What causes it? How can I treat it? °A: Cluster headache gets its name because the attacks come in groups. The pain arrives with little, if any, warning. It is usually on one side of the head. A tearing or bloodshot eye and a runny nose on the same side of the headache may also accompany the pain. Cluster headaches are believed to be caused by chemical reactions in the brain. They have been described as the most severe and intense of any headache type. Treatment for cluster headache includes prescription medication and oxygen. °SINUS HEADACHES °Q: What is a sinus headache? What causes it? How can I treat it? °A: When a cavity in the bones of the face and skull (a sinus) becomes inflamed, the inflammation will cause localized pain. This condition is usually the result of an allergic reaction, a tumor, or an infection. If your headache is caused by a sinus blockage, such as an infection, you will probably have a fever. An x-ray will confirm a sinus blockage. Your caregiver's treatment might include antibiotics for the infection, as well as antihistamines or decongestants.  °REBOUND HEADACHES °Q: What is a rebound headache? What causes it? How can I treat it? °A: A pattern of taking acute headache medications too often can lead to a condition known as "rebound headache."   A pattern of taking too much headache medication includes taking it more than 2 days per week or in excessive amounts. That means more than the label or a caregiver advises. With rebound  headaches, your medications not only stop relieving pain, they actually begin to cause headaches. Doctors treat rebound headache by tapering the medication that is being overused. Sometimes your caregiver will gradually substitute a different type of treatment or medication. Stopping may be a challenge. Regularly overusing a medication increases the potential for serious side effects. Consult a caregiver if you regularly use headache medications more than 2 days per week or more than the label advises. °ADDITIONAL QUESTIONS AND ANSWERS °Q: What is biofeedback? °A: Biofeedback is a self-help treatment. Biofeedback uses special equipment to monitor your body's involuntary physical responses. Biofeedback monitors: °· Breathing. °· Pulse. °· Heart rate. °· Temperature. °· Muscle tension. °· Brain activity. °Biofeedback helps you refine and perfect your relaxation exercises. You learn to control the physical responses that are related to stress. Once the technique has been mastered, you do not need the equipment any more. °Q: Are headaches hereditary? °A: Four out of five (80%) of people that suffer report a family history of migraine. Scientists are not sure if this is genetic or a family predisposition. Despite the uncertainty, a child has a 50% chance of having migraine if one parent suffers. The child has a 75% chance if both parents suffer.  °Q: Can children get headaches? °A: By the time they reach high school, most young people have experienced some type of headache. Many safe and effective approaches or medications can prevent a headache from occurring or stop it after it has begun.  °Q: What type of doctor should I see to diagnose and treat my headache? °A: Start with your primary caregiver. Discuss his or her experience and approach to headaches. Discuss methods of classification, diagnosis, and treatment. Your caregiver may decide to recommend you to a headache specialist, depending upon your symptoms or other  physical conditions. Having diabetes, allergies, etc., may require a more comprehensive and inclusive approach to your headache. The National Headache Foundation will provide, upon request, a list of NHF physician members in your state. °Document Released: 08/16/2003 Document Revised: 08/18/2011 Document Reviewed: 01/24/2008 °ExitCare® Patient Information ©2015 [employer]. This information is not intended to replace advice given to you by your health care provider. Make sure you discuss any questions you have with your health care provider. ° ° °Emergency Department Resource Guide °1) Find a Doctor and Pay Out of Pocket °Although you won't have to find out who is covered by your insurance plan, it is a good idea to ask around and get recommendations. You will then need to call the office and see if the doctor you have chosen will accept you as a new patient and what types of options they offer for patients who are self-pay. Some doctors offer discounts or will set up payment plans for their patients who do not have insurance, but you will need to ask so you aren't surprised when you get to your appointment. ° °2) Contact Your Local Health Department °Not all health departments have doctors that can see patients for sick visits, but many do, so it is worth a call to see if yours does. If you don't know where your local health department is, you can check in your phone book. The CDC also has a tool to help you locate your state's health department, and many state websites also   have listings of all of their local health departments. ° °3) Find a Walk-in Clinic °If your illness is not likely to be very severe or complicated, you may want to try a walk in clinic. These are popping up all over the country in pharmacies, drugstores, and shopping centers. They're usually staffed by nurse practitioners or physician assistants that have been trained to treat common illnesses and complaints. They're usually fairly quick and  inexpensive. However, if you have serious medical issues or chronic medical problems, these are probably not your best option. ° °No Primary Care Doctor: °- Call [facility] at  832-8000 - they can help you locate a primary care doctor that  accepts your insurance, provides certain services, etc. °- Physician Referral Service- 1-800-533-3463 ° °Chronic Pain Problems: °Organization         Address  Phone   Notes  °[facility]  (336) 297-2271 Patients need to be referred by their primary care doctor.  ° °Medication Assistance: °Organization         Address  Phone   Notes  °[place] [facility] [address] °[place], [place] [address] (336) 641-8030 --Must be a resident of [place] °-- Must have NO insurance coverage whatsoever (no Medicaid/ Medicare, etc.) °-- The pt. MUST have a primary care doctor that directs their care regularly and follows them in the community °  °[facility]  (866) 331-1348   °[employer]  (888) 892-1162   ° °Agencies that provide inexpensive medical care: °Organization         Address  Phone   Notes  °[facility]  (336) 832-8035   °[facility]    (336) 832-7272   °[facility] [address] °[place], [place] [address] (336) 832-4777   °[facility] [address], °[place] (336) 271-4999   °[facility]    (336) 373-0678   °[facility]    (336) 272-1050   °[facility] ° [address], [place] Phone:  (336) 832-4444, Fax:  (336) 832-4440 Hours of Operation:  9 am - 6 pm, M-F.  Also accepts Medicaid/Medicare and self-pay.  °[facility] ° [address], [place] Phone: (336) [phone], Fax: (336) [phone]. Hours of Operation:  8:30 am - 5:30 pm, M-F.  Also accepts Medicaid and self-pay.  °[facility] [place] [address], [place] Phone: (336) 878-6027   °[facility] [address], [place], [place] (336)723-1848, Ext. 123 Mondays & Thursdays: 7-9 AM.  First 15 patients are seen on a first come, first serve basis. °  ° °Medicaid-accepting [place] Providers: ° °Organization         Address  Phone   Notes  °[facility] [address], [place] (336) 641-2100 Also accepts self-pay patients.  °[facility] [address], [place] ° (336) 856-9996   °[facility] [address], [place] (336) 288-8857   °[facility] [address] [place] [address], [place] (336) 299-7000   °Veita Bland [address], [place]  ° (336) 373-1557 Only accepts [place] Access Medicaid patients after they have their name applied to their card.  ° °Self-Pay (no insurance) in [place]: ° °Organization         Address  Phone     Notes  °Sickle Cell Patients, [facility] [address], [place] (336) 832-1970   °[facility] [address], [place] (336) 832-4400   °[facility] ° [address], [place] (336) 992-4800   °[facility]/Dr. Osei-Bonsu ° [address] [place] [address], [place] or [address], [place] (336) [phone] Phone number for both [place] and [place] locations is the same.  °[facility] [address], [place] (336) 299-0000   °[facility] [address] [place] [address], [place] or [address] (336) 852-7530 °(336) 878-2260   °[facility] [address], [place] (336) 350-1642, phone; (336) 294-5005, fax Sees patients 1st and 3rd Saturday of every month.  Must not qualify for public or private insurance (i.e. Medicaid, Medicare, [place] Health Choice, Veterans' Benefits) • Household income should be no more than 200% of the poverty level •The clinic cannot treat you if you are pregnant or  think you are pregnant • Sexually transmitted diseases are not treated at the clinic.  ° ° °Dental Care: °Organization         Address  Phone  Notes  °[facility] [address], [place] (336) 641-6152 Accepts children up to age 21 who are enrolled in Medicaid or [place] Health Choice; pregnant women with a Medicaid card; and children who have applied for Medicaid or [place] Health Choice, but were declined, whose parents can pay a reduced fee at time of service.  °[facility] [place]  [address], [place] (336) 641-7733 Accepts children up to age 21 who are enrolled in Medicaid or [place] Health Choice; pregnant women with a Medicaid card; and children who have applied for Medicaid or [place] Health Choice, but were declined, whose parents can pay a reduced fee at time of service.  °[facility] ° [address], [place] (336) 641-4533 Patients are seen by appointment only. Walk-ins are not accepted. [facility] will see patients 18 years of age and older. °Monday - Tuesday (8am-5pm) °Most Wednesdays (8:30-5pm) °$30 per visit, cash only  °[facility] ° [address], [place] (336) 641-4533 Patients are seen by appointment only. Walk-ins are not accepted. [facility] will see patients 18 years of age and older. °One Wednesday Evening (Monthly: Volunteer Based).  $30 per visit, cash only  °[facility]  (919) 537-3737 for adults; Children under age 4, call Graduate Pediatric Dentistry at ([phone]) [phone]. Children aged 4-14, please call ([phone]) [phone] to request a pediatric application. ° Dental services are provided in all areas of dental care including fillings, crowns and bridges, complete and partial dentures, implants, gum treatment, root canals, and extractions. Preventive care is also provided. Treatment is provided to both adults  and children. °Patients are selected via a lottery and there is often a waiting list. °  °[facility] [address], ° ° (336) 763-8833 [email or web address] °  °[facility] [address], [place], [place] (336)723-1848, Ext. 123 Second and Fourth Thursday of each month, opens at 6:30 AM; Clinic ends at 9 AM.  Patients are seen on a first-come first-served basis, and a limited number are seen during each clinic.  ° °[facility] ° [address], [place], [place] (336) 723-7904     Eligibility Requirements °You must have lived in [place], [place], or [place] counties for at least the last three months. °  You cannot be eligible for state or federal sponsored healthcare insurance, including Veterans Administration, Medicaid, or Medicare. °  You generally cannot be eligible for healthcare insurance through your employer.  °  How to apply: °Eligibility screenings are held every Tuesday and Wednesday afternoon from 1:00 pm until 4:00 pm. You do not need an appointment for the interview!  °[facility] [address], [place], [place] 336-631-2330   °[facility]  336-342-8273   °[facility]  336-703-3100   °[facility]  336-570-6415   ° °Behavioral Health Resources in the Community: °Intensive Outpatient Programs °Organization         Address  Phone  Notes  °[facility] [address], [place], [place] 336-878-6098   °[facility] [address], [place], [place] 336-832-9800   °[facility] [address], [place], [place] ° 336-882-2125   °[facility] [address],  °[place], [place] 1-800-853-5163 or 336-641-4981   °Substance Abuse Resources °Organization         Address  Phone  Notes  °[facility]  336-882-2125   °[facility]  336-784-9470   °[facility]  336-285-9073     °[facility]  336-845-3988   °Residential & Outpatient Substance Abuse Program  1-800-659-3381   °Psychological Services °Organization         Address  Phone  Notes  °[facility]  336- 832-9600   °[facility]  336- 378-7881   °[facility] [address], [place] 1-800-853-5163 or 336-641-4981   ° °Mobile Crisis Teams °Organization         Address  Phone  Notes  °[facility], Mobile Crisis Care Unit  1-877-626-1772   °Assertive °[facility] ° [address] [place], [place] 336-834-9664   °Sharon DeEsch [address] °[place] 336-554-5454   ° °Self-Help/Support Groups °Organization         Address  Phone             Notes  °[facility] - variety of support groups  336- 373-1402 Call for more information  °Narcotics Anonymous (NA), [facility] [address], °[place]  2 meetings at this location  ° °Residential Treatment Programs °Organization         Address  Phone  Notes  °[facility] [address],    °[place]  1-866-801-8205   °[facility] ° [address], [place], [place] 704-293-8524   °[facility] [address], [place] 336-845-3988 Admissions: 8am-3pm M-F  °[facility] [address]    °[place], [place] 336-841-1104   °[facility] [address], [place], [place] 336-379-7146   °[facility] [address]  °[place], [place] 336-285-9073   °[facility] - Intensive Outpatient [address], [place], [place] 336-852-3033   °[facility] [address]  °[place], [place] 1-877-615-2722 or 336-784-9470   °[facility] [address] [place], [place] 336-227-7417 Accepts Medicaid  °[facility] [address]  ° [place] 1-800-659-3381 Substance Abuse/Addiction Treatment  ° °[place]  Behavioral Health Resources °Organization           Address  Phone  Notes  °[facility]  (888) 581-9988   °Julie Brannon, PhD [address] [place], [place]   (336) 349-5553 or (336) 951-0000   °[facility]   [address] °[place], [place] (336) 349-4454   °[facility] [address], [place], [place] (336) 342-8316 Insurance/Medicaid/sponsorship through [facility]  °[facility] [address]                                    [place], [place] (336) 342-8316 Therapy/tele-psych/case  °[facility] [address]  ° [place], [place] (336) 349-2233    °Dr. Arfeen  (336) 349-4544   °[facility] [place]  [employer] [place] [facility] 1) [address], [place] °2) [address], [place] °3)  [address], [place] (336) 349-3220 °(336) 342-7768 ° °(336) 342-8140   °[place] [facility] (336) 342-1394 or (336) 342-3537 (After Hours)    ° ° ° °

## 2014-09-25 ENCOUNTER — Emergency Department (HOSPITAL_COMMUNITY)
Admission: EM | Admit: 2014-09-25 | Discharge: 2014-09-25 | Disposition: A | Discharge status: Home / Self Care | Payer: BLUE CROSS/BLUE SHIELD | Attending: Emergency Medicine | Admitting: Emergency Medicine

## 2014-09-25 ENCOUNTER — Encounter (HOSPITAL_COMMUNITY): Payer: Self-pay | Admitting: Emergency Medicine

## 2014-09-25 DIAGNOSIS — Z79899 Other long term (current) drug therapy: Secondary | ICD-10-CM | POA: Diagnosis not present

## 2014-09-25 DIAGNOSIS — Z720 Tobacco use: Secondary | ICD-10-CM | POA: Insufficient documentation

## 2014-09-25 DIAGNOSIS — M1990 Unspecified osteoarthritis, unspecified site: Secondary | ICD-10-CM | POA: Insufficient documentation

## 2014-09-25 DIAGNOSIS — Z880 Allergy status to penicillin: Secondary | ICD-10-CM | POA: Insufficient documentation

## 2014-09-25 DIAGNOSIS — Z7952 Long term (current) use of systemic steroids: Secondary | ICD-10-CM | POA: Diagnosis not present

## 2014-09-25 DIAGNOSIS — Z791 Long term (current) use of non-steroidal anti-inflammatories (NSAID): Secondary | ICD-10-CM | POA: Diagnosis not present

## 2014-09-25 DIAGNOSIS — R21 Rash and other nonspecific skin eruption: Secondary | ICD-10-CM

## 2014-09-25 MED ORDER — PREDNISONE 20 MG PO TABS: 60.0000 mg | ORAL_TABLET | Freq: Every day | ORAL | Status: DC

## 2014-09-25 MED ORDER — PREDNISONE 20 MG PO TABS
60.0000 mg | ORAL_TABLET | Freq: Once | ORAL | Status: AC
  Administered 2014-09-25: 60 mg via ORAL
  Filled 2014-09-25: qty 3

## 2014-09-25 MED ORDER — FAMOTIDINE 20 MG PO TABS
20.0000 mg | ORAL_TABLET | Freq: Once | ORAL | Status: AC
  Administered 2014-09-25: 20 mg via ORAL
  Filled 2014-09-25: qty 1

## 2014-09-25 NOTE — Discharge Instructions (Signed)
You can take Pepcid in addition to Benadryl to help with the allergic reaction.  Read instructions below to learn more about your diagnosis and for reasons to return to the ED. Followup with your doctor if symptoms are not improving after 3-4 days.  If you do not have a doctor use the resource guide listed below to help he find one. You may return to the emergency department if symptoms worsen, become progressive, or become more concerning.  Allergic Reaction  There are many allergens around [place]. It may be difficult to know what caused your reaction. You may follow up with an allergy specialist for further testing to learn more about your specific allergies.   TREATMENT AND HOME CARE INSTRUCTIONS  If hives or rash are present:  Use an over-the-counter antihistamine (Benadryl or Zyrtec) for hives and itching as needed. Do not drive or drink alcohol until medications used to treat the reaction have worn off. Antihistamines tend to make people sleepy.  Apply cold cloths (compresses) to the skin or take baths in cool water. This will help itching. Avoid hot baths or showers. Heat will make a rash and itching worse.  See Your Primary Care Doctor if:  Your allergies are becoming progressively more troublesome.  You suspect a food allergy. Symptoms generally happen within 30 minutes of eating a food.  Your symptoms have not gone away within 2 days.  SEEK IMMEDIATE MEDICAL CARE IF:  You develop difficulty breathing or wheezing, or have a tight feeling in your chest or throat (feeling like your throat is closing) You develop swollen lips or tongue You develop hives on your chest, neck or face. You are unable to swallow fluids or salvia secretions.   A severe reaction with any of the above problems should be considered life-threatening. If you suddenly develop difficulty breathing call for local emergency medical help. THIS IS AN EMERGENCY.

## 2014-09-25 NOTE — ED Provider Notes (Signed)
CSN: [id number]     Arrival date & time 09/25/14  4098 History   First MD Initiated Contact with Patient 09/25/14 [phone]     Chief Complaint  Patient presents with  . Pruritis  . Allergic Reaction     (Consider location/radiation/quality/duration/timing/severity/associated sxs/prior Treatment) HPI Comments: Patient presents today with a chief complaint of diffuse pruritis.  She states that this morning she took her usual Tylenol for arthritis pain and immediately developed diffuse itching all over her body.  She states that she also noticed two wheels on the left arm.  She then applied Hydrocortisone to the area, took Benadryl, and sprayed a Poison Ivy spray on the area.  She states that the wheels resolved, but she continues to have itching all over.  She states that she has taken Tylenol numerous times in the past and has never had a reaction.  She denies any new medications, foods, detergents, lotions, or soaps.  She denies any SOB, wheezing, abdominal pain, nausea, vomiting, or swelling of the lips, tongue, or throat.    Patient is a 34 y.o. female presenting with allergic reaction. The history is provided by the patient.  Allergic Reaction   Past Medical History  Diagnosis Date  . Arthritis    Past Surgical History  Procedure Laterality Date  . Cesarean section    . Cesarean section     No family history on file. History  Substance Use Topics  . Smoking status: Current Every Day Smoker  . Smokeless tobacco: Not on file  . Alcohol Use: Yes   OB History    No data available     Review of Systems  All other systems reviewed and are negative.     Allergies  Red dye; Penicillins; and Morphine and related  Home Medications   Prior to Admission medications   Medication Sig Start Date End Date Taking? Authorizing Provider  acetaminophen (TYLENOL) 650 MG CR tablet Take 1,950 mg by mouth every 3 (three) hours as needed for pain (and headache).     Historical Provider, MD   aspirin-acetaminophen-caffeine (EXCEDRIN MIGRAINE) [phone] MG per tablet Take 2 tablets by mouth every 4 (four) hours as needed for headache.    Historical Provider, MD  Aspirin-Acetaminophen-Caffeine (EXCEDRIN PO) Take 2 tablets by mouth every 4 (four) hours as needed (for headache).    Historical Provider, MD  butalbital-acetaminophen-caffeine (FIORICET) 50-325-40 MG per tablet Take 1-2 tablets by mouth every 6 (six) hours as needed for headache. 09/09/14 09/09/15  Fayrene Helper, PA-C  famotidine (PEPCID) 20 MG tablet Take 1 tablet (20 mg total) by mouth once. Patient not taking: Reported on 09/09/2014 05/10/14   Elwin Mocha, MD  ibuprofen (ADVIL,MOTRIN) 600 MG tablet Take 1,200 mg by mouth every 6 (six) hours as needed for fever, headache or moderate pain.    Historical Provider, MD  meloxicam (MOBIC) 15 MG tablet Take 1 tablet (15 mg total) by mouth daily. 08/13/14   Graylon Good, PA-C  naproxen sodium (ANAPROX) 220 MG tablet Take 440 mg by mouth every 4 (four) hours as needed (for headache).     Historical Provider, MD  oxyCODONE-acetaminophen (PERCOCET) 5-325 MG per tablet Take 2 tablets by mouth every 4 (four) hours as needed. Patient not taking: Reported on 09/09/2014 10/21/13   Fayrene Helper, PA-C  predniSONE (DELTASONE) 20 MG tablet Take 3 tablets (60 mg total) by mouth daily. Patient not taking: Reported on 09/09/2014 05/10/14   Elwin Mocha, MD  sulfamethoxazole-trimethoprim (BACTRIM DS) 800-160 MG per  tablet Take 1 tablet by mouth 2 (two) times daily. Patient not taking: Reported on 09/09/2014 10/21/13   Fayrene Helper, PA-C   BP 128/68 mmHg  Pulse 109  Temp(Src) 97.7 F (36.5 C) (Oral)  Resp 18  SpO2 100% Physical Exam  Constitutional: She appears well-developed and well-nourished.  HENT:  Head: Normocephalic and atraumatic.  Mouth/Throat: Oropharynx is clear and moist.  Airway widely patent  Neck: Normal range of motion. Neck supple.  Cardiovascular: Normal rate, regular rhythm and normal  heart sounds.   Pulmonary/Chest: Effort normal and breath sounds normal. No respiratory distress. She has no wheezes.  Abdominal: Soft. Bowel sounds are normal. She exhibits no distension and no mass. There is no tenderness. There is no rebound and no guarding.  Musculoskeletal: Normal range of motion.  Neurological: She is alert.  Skin: Skin is warm and dry. Rash noted. Rash is not urticarial.  Very faint erythematous macular rash of the left upper extremity  Psychiatric: She has a normal mood and affect.  Nursing note and vitals reviewed.   ED Course  Procedures (including critical care time) Labs Review Labs Reviewed - No data to display  Imaging Review No results found.   EKG Interpretation None      MDM   Final diagnoses:  None   Patient presents today with a rash, which she suspects is an allergic reaction.  Rash improved by the time she arrived in the ED.  No signs of Anaphylaxis.  VSS.  No oral lesions.  Feel that the patient is stable for discharge.  Return precautions given.      Santiago Glad, PA-C 09/25/14 2135  Doug Sou, MD 09/27/14 [phone]

## 2014-09-25 NOTE — ED Notes (Addendum)
Pt states that she took tylenol earlier for her arthritis pain about hour ago and now itching all over her body . Pt states that she tried benadryl, hydrocortisone cream and poison ivy spray but hasnt helped with the itching.  Pt denies any new soaps, deodorants, lotions, detergents etc.

## 2014-09-26 ENCOUNTER — Encounter (HOSPITAL_COMMUNITY): Payer: Self-pay | Admitting: *Deleted

## 2014-09-26 ENCOUNTER — Emergency Department (HOSPITAL_COMMUNITY)
Admission: EM | Admit: 2014-09-26 | Discharge: 2014-09-26 | Disposition: A | Discharge status: Home / Self Care | Payer: BLUE CROSS/BLUE SHIELD | Attending: Emergency Medicine | Admitting: Emergency Medicine

## 2014-09-26 DIAGNOSIS — Z720 Tobacco use: Secondary | ICD-10-CM | POA: Diagnosis not present

## 2014-09-26 DIAGNOSIS — R21 Rash and other nonspecific skin eruption: Secondary | ICD-10-CM | POA: Diagnosis present

## 2014-09-26 DIAGNOSIS — M1990 Unspecified osteoarthritis, unspecified site: Secondary | ICD-10-CM | POA: Diagnosis not present

## 2014-09-26 DIAGNOSIS — Z880 Allergy status to penicillin: Secondary | ICD-10-CM | POA: Diagnosis not present

## 2014-09-26 DIAGNOSIS — L299 Pruritus, unspecified: Secondary | ICD-10-CM | POA: Diagnosis not present

## 2014-09-26 DIAGNOSIS — Z79899 Other long term (current) drug therapy: Secondary | ICD-10-CM | POA: Insufficient documentation

## 2014-09-26 MED ORDER — FAMOTIDINE 20 MG PO TABS: 20.0000 mg | ORAL_TABLET | Freq: Two times a day (BID) | ORAL | Status: DC

## 2014-09-26 MED ORDER — FAMOTIDINE 20 MG PO TABS: 20.0000 mg | ORAL_TABLET | Freq: Once | ORAL | Status: DC

## 2014-09-26 MED ORDER — FAMOTIDINE 20 MG PO TABS
20.0000 mg | ORAL_TABLET | Freq: Once | ORAL | Status: AC
  Administered 2014-09-26: 20 mg via ORAL
  Filled 2014-09-26: qty 1

## 2014-09-26 MED ORDER — HYDROXYZINE HCL 10 MG PO TABS
10.0000 mg | ORAL_TABLET | Freq: Once | ORAL | Status: AC
  Administered 2014-09-26: 10 mg via ORAL
  Filled 2014-09-26: qty 1

## 2014-09-26 MED ORDER — HYDROCORTISONE 2.5 % EX LOTN: TOPICAL_LOTION | Freq: Two times a day (BID) | CUTANEOUS | Status: DC

## 2014-09-26 MED ORDER — HYDROXYZINE HCL 25 MG PO TABS: 25.0000 mg | ORAL_TABLET | Freq: Four times a day (QID) | ORAL | Status: DC

## 2014-09-26 NOTE — ED Provider Notes (Signed)
CSN: [id number]     Arrival date & time 09/26/14  1309 History  This chart is scribed for non-physician practitioner, Dierdre Forth, PA-C, working with Eber Hong, MD by Abel Presto, ED Scribe.  This patient was seen in room WTR8/WTR8 and the patient's care was started 3:49 PM.      Chief Complaint  Patient presents with  . Pruritis    The history is provided by the patient and medical records. No language interpreter was used.   HPI Comments: Catherine Underwood is a 34 y.o. female who presents to the Emergency Department complaining of itching with onset yesterday morning. Pt took her normal dose of Tylenol arthritis prior to onset and immediately noticed diffuse generalized itching. Pt has tried hydrocortisone, Benadryl, and poison ivy spray with no relief. Pt reports she takes the Tylenol frequently and has never had such a reaction. Pt was seen in ED yesterday for same and d/c with Rx for prednisone and Benadryl. Itching had improved upon discharge, but pt reports itching is constant today. Pt has been compliant with medications. She tried taking a bath with apple cider vinegar, oatmeal, and baking soda last night with no relief. She notes some warmth to various areas of her skin today. Pt denies any new soaps, detergents, deodorants, or lotions, and sleeping anywhere new recently.  Pt also presented with a rash to left arm on 09/02/14 which she had been seen for at Urgent Care previously. ED provider's note at that time states pt had tried hydrocortisone cream, Benadryl, and Zyrtec with relief.  She denies any SOB, itching inside mouth, wheezing, abdominal pain, nausea, vomiting, or swelling of the lips, tongue, or throat.   Past Medical History  Diagnosis Date  . Arthritis    Past Surgical History  Procedure Laterality Date  . Cesarean section    . Cesarean section     History reviewed. No pertinent family history. History  Substance Use Topics  . Smoking status: Current  Every Day Smoker  . Smokeless tobacco: Not on file  . Alcohol Use: Yes   OB History    No data available     Review of Systems  Constitutional: Negative for fever, diaphoresis, appetite change, fatigue and unexpected weight change.  HENT: Negative for facial swelling, mouth sores and trouble swallowing.   Eyes: Negative for visual disturbance.  Respiratory: Negative for cough, chest tightness, shortness of breath and wheezing.   Cardiovascular: Negative for chest pain.  Gastrointestinal: Negative for nausea, vomiting, abdominal pain, diarrhea and constipation.  Endocrine: Negative for polydipsia, polyphagia and polyuria.  Genitourinary: Negative for dysuria, urgency, frequency and hematuria.  Musculoskeletal: Negative for back pain and neck stiffness.  Skin: Positive for rash.  Allergic/Immunologic: Negative for immunocompromised state.  Neurological: Negative for syncope, light-headedness and headaches.  Hematological: Does not bruise/bleed easily.  Psychiatric/Behavioral: Negative for sleep disturbance. The patient is not nervous/anxious.       Allergies  Red dye; Penicillins; and Morphine and related  Home Medications   Prior to Admission medications   Medication Sig Start Date End Date Taking? Authorizing Provider  acetaminophen (TYLENOL) 650 MG CR tablet Take 1,300 mg by mouth daily as needed for pain (and headache).     Historical Provider, MD  butalbital-acetaminophen-caffeine (FIORICET) 50-325-40 MG per tablet Take 1-2 tablets by mouth every 6 (six) hours as needed for headache. 09/09/14 09/09/15  Fayrene Helper, PA-C  famotidine (PEPCID) 20 MG tablet Take 1 tablet (20 mg total) by mouth once. 09/26/14  Jacki Couse, PA-C  hydrocortisone 2.5 % lotion Apply topically 2 (two) times daily. 09/26/14   Lesleyann Fichter, PA-C  hydrOXYzine (ATARAX/VISTARIL) 25 MG tablet Take 1 tablet (25 mg total) by mouth every 6 (six) hours. 09/26/14   Adeena Bernabe, PA-C  meloxicam  (MOBIC) 15 MG tablet Take 1 tablet (15 mg total) by mouth daily. Patient taking differently: Take 15 mg by mouth daily as needed for pain.  08/13/14   Graylon Good, PA-C  oxyCODONE-acetaminophen (PERCOCET) 5-325 MG per tablet Take 2 tablets by mouth every 4 (four) hours as needed. Patient not taking: Reported on 09/09/2014 10/21/13   Fayrene Helper, PA-C  predniSONE (DELTASONE) 20 MG tablet Take 3 tablets (60 mg total) by mouth daily. 09/25/14   Heather Laisure, PA-C  sulfamethoxazole-trimethoprim (BACTRIM DS) 800-160 MG per tablet Take 1 tablet by mouth 2 (two) times daily. Patient not taking: Reported on 09/09/2014 10/21/13   Fayrene Helper, PA-C   BP 113/74 mmHg  Pulse 83  Temp(Src) 98.9 F (37.2 C) (Oral)  Resp 16  SpO2 99% Physical Exam  Constitutional: She is oriented to person, place, and time. She appears well-developed and well-nourished. No distress.  HENT:  Head: Normocephalic and atraumatic.  Right Ear: Tympanic membrane, external ear and ear canal normal.  Left Ear: Tympanic membrane, external ear and ear canal normal.  Nose: Nose normal. No mucosal edema or rhinorrhea.  Mouth/Throat: Uvula is midline. No uvula swelling. No oropharyngeal exudate, posterior oropharyngeal edema, posterior oropharyngeal erythema or tonsillar abscesses.  No swelling of the uvula or oropharynx  Eyes: Conjunctivae are normal.  Neck: Normal range of motion.  Patent airway No stridor; normal phonation Handling secretions without difficulty  Cardiovascular: Normal rate, normal heart sounds and intact distal pulses.   No murmur heard. Pulmonary/Chest: Effort normal and breath sounds normal. No stridor. No respiratory distress. She has no wheezes.  No wheezes or rhonchi  Abdominal: Soft. Bowel sounds are normal. There is no tenderness.  Musculoskeletal: Normal range of motion. She exhibits no edema.  Neurological: She is alert and oriented to person, place, and time.  Skin: Skin is warm and dry. Rash noted.  She is not diaphoretic.  Fine papular erythematous rash covering patient's entire body; Mild excoriations - no induration or fluctuance to indicate secondary infection  Psychiatric: She has a normal mood and affect.  Nursing note and vitals reviewed.   ED Course  Procedures (including critical care time) DIAGNOSTIC STUDIES: Oxygen Saturation is 99% on room air, normal by my interpretation.    COORDINATION OF CARE: 3:58 PM Discussed treatment plan with patient at beside, the patient agrees with the plan and has no further questions at this time.    Labs Review Labs Reviewed - No data to display  Imaging Review No results found.   EKG Interpretation None      MDM   Final diagnoses:  Pruritus  Rash and nonspecific skin eruption    Meigan N Philbrook presents with persistent rash.  Patient given Benadryl and prednisone yesterday with minimal improvement. She was given Atarax and Pepcid here in the emergency department today. Patient is hemodynamically stable, in no respiratory distress, and denies the feeling of throat closing. Pt has been advised to continue taking OTC benadryl, prednisone with addition of Pepcid, hydrocortisone cream & return to the ED if they have a mod-severe allergic rxn (s/s including throat closing, difficulty breathing, swelling of lips face or tongue). Pt is to follow up with their PCP. Pt is agreeable with  plan & verbalizes understanding.  BP 113/74 mmHg  Pulse 83  Temp(Src) 98.9 F (37.2 C) (Oral)  Resp 16  SpO2 99%  I personally performed the services described in this documentation, which was scribed in my presence. The recorded information has been reviewed and is accurate.     Dahlia Client Tallulah Hosman, PA-C 09/26/14 1628  Eber Hong, MD 09/27/14 1124

## 2014-09-26 NOTE — Discharge Instructions (Signed)
1. Medications: atarax, hydrocortisone lotion, usual home medications 2. Treatment: rest, drink plenty of fluids, take medications as prescribed 3. Follow Up: Please followup with your primary doctor in 3 days for discussion of your diagnoses and further evaluation after today's visit; if you do not have a primary care doctor use the resource guide provided to find one; followup with dermatology as needed; Return to the ER for difficulty breathing, return of allergic reaction or other concerning symptoms

## 2014-09-26 NOTE — ED Notes (Addendum)
Pt seen and treated in ED for itching, pt said in past she has taken benadryl, hydrocortisone cream and poison ivy spray but nothing has helped with itching. Denies any new soaps, deodorants, lotions, and detergents etc.   Pt was given prescription for prednisone yesterday, took prednisone today. Reports itching is still present. Last night pt took separate baths with apple cider vinegar, outmeal, and baking soda with no relief. Denies pain. rn explained that it may take a few days for prednisone to work.

## 2014-10-20 ENCOUNTER — Encounter (HOSPITAL_COMMUNITY): Payer: Self-pay | Admitting: Emergency Medicine

## 2014-10-20 ENCOUNTER — Emergency Department (HOSPITAL_COMMUNITY)
Admission: EM | Admit: 2014-10-20 | Discharge: 2014-10-20 | Disposition: A | Discharge status: Home / Self Care | Payer: BLUE CROSS/BLUE SHIELD | Attending: Emergency Medicine | Admitting: Emergency Medicine

## 2014-10-20 DIAGNOSIS — Y998 Other external cause status: Secondary | ICD-10-CM | POA: Insufficient documentation

## 2014-10-20 DIAGNOSIS — R21 Rash and other nonspecific skin eruption: Secondary | ICD-10-CM | POA: Diagnosis present

## 2014-10-20 DIAGNOSIS — T7840XA Allergy, unspecified, initial encounter: Secondary | ICD-10-CM | POA: Insufficient documentation

## 2014-10-20 DIAGNOSIS — Z880 Allergy status to penicillin: Secondary | ICD-10-CM | POA: Insufficient documentation

## 2014-10-20 DIAGNOSIS — Z720 Tobacco use: Secondary | ICD-10-CM | POA: Insufficient documentation

## 2014-10-20 DIAGNOSIS — X58XXXA Exposure to other specified factors, initial encounter: Secondary | ICD-10-CM | POA: Insufficient documentation

## 2014-10-20 DIAGNOSIS — Z792 Long term (current) use of antibiotics: Secondary | ICD-10-CM | POA: Diagnosis not present

## 2014-10-20 DIAGNOSIS — Y9289 Other specified places as the place of occurrence of the external cause: Secondary | ICD-10-CM | POA: Diagnosis not present

## 2014-10-20 DIAGNOSIS — Z791 Long term (current) use of non-steroidal anti-inflammatories (NSAID): Secondary | ICD-10-CM | POA: Insufficient documentation

## 2014-10-20 DIAGNOSIS — M1990 Unspecified osteoarthritis, unspecified site: Secondary | ICD-10-CM | POA: Diagnosis not present

## 2014-10-20 DIAGNOSIS — Z7952 Long term (current) use of systemic steroids: Secondary | ICD-10-CM | POA: Diagnosis not present

## 2014-10-20 DIAGNOSIS — Y9389 Activity, other specified: Secondary | ICD-10-CM | POA: Diagnosis not present

## 2014-10-20 MED ORDER — METHYLPREDNISOLONE SODIUM SUCC 125 MG IJ SOLR
125.0000 mg | Freq: Once | INTRAMUSCULAR | Status: AC
  Administered 2014-10-20: 125 mg via INTRAMUSCULAR
  Filled 2014-10-20: qty 2

## 2014-10-20 MED ORDER — HYDROXYZINE HCL 25 MG PO TABS: 25.0000 mg | ORAL_TABLET | Freq: Four times a day (QID) | ORAL | Status: DC

## 2014-10-20 MED ORDER — DIPHENHYDRAMINE HCL 50 MG/ML IJ SOLN
25.0000 mg | Freq: Once | INTRAMUSCULAR | Status: AC
  Administered 2014-10-20: 25 mg via INTRAMUSCULAR
  Filled 2014-10-20: qty 1

## 2014-10-20 MED ORDER — PREDNISONE 20 MG PO TABS: 40.0000 mg | ORAL_TABLET | Freq: Every day | ORAL | Status: DC

## 2014-10-20 NOTE — ED Notes (Signed)
Pt c/o allergic reaction onset this morning, redness and mild superficial skin edema present to arms bilaterally, pt c/o itch across entire body, denies pain. Pt denies SOB, in control of oral secretions. No wheezing, lung sounds are clears. Pt took 2 tablets of diphenhydramine at 1230 today. Pt states she suspects meloxicam as the cause of the reaction.

## 2014-10-20 NOTE — Discharge Instructions (Signed)
Allergies  Allergies may happen from anything your body is sensitive to. This may be food, medicines, pollens, chemicals, and many other things. Food allergies can be severe and deadly.  HOME CARE  If you do not know what causes a reaction, keep a diary. Write down the foods you ate and the symptoms that followed. Avoid foods that cause reactions.  If you have red raised spots (hives) or a rash:  Take medicine as told by your doctor.  Use medicines for red raised spots and itching as needed.  Apply cold cloths (compresses) to the skin. Take a cool bath. Avoid hot baths or showers.  If you are severely allergic:  It is often necessary to go to the hospital after you have treated your reaction.  Wear your medical alert jewelry.  You and your family must learn how to give a allergy shot or use an allergy kit (anaphylaxis kit).  Always carry your allergy kit or shot with you. Use this medicine as told by your doctor if a severe reaction is occurring. GET HELP RIGHT AWAY IF:  You have trouble breathing or are making high-pitched whistling sounds (wheezing).  You have a tight feeling in your chest or throat.  You have a puffy (swollen) mouth.  You have red raised spots, puffiness (swelling), or itching all over your body.  You have had a severe reaction that was helped by your allergy kit or shot. The reaction can return once the medicine has worn off.  You think you are having a food allergy. Symptoms most often happen within 30 minutes of eating a food.  Your symptoms have not gone away within 2 days or are getting worse.  You have new symptoms.  You want to retest yourself with a food or drink you think causes an allergic reaction. Only do this under the care of a doctor. MAKE SURE YOU:   Understand these instructions.  Will watch your condition.  Will get help right away if you are not doing well or get worse. Document Released: 09/20/2012 Document Reviewed:  09/20/2012 [facility] Patient Information 2015 [place]. This information is not intended to replace advice given to you by your health care provider. Make sure you discuss any questions you have with your health care provider.

## 2014-10-20 NOTE — ED Provider Notes (Signed)
CSN: [id number]     Arrival date & time 10/20/14  1827 History  This chart was scribed for a non-physician practitioner, Teressa Lower, NP working with Elwin Mocha, MD by [place], ED Scribe. The patient was seen in WTR9/WTR9. The patient's care was started at 6:46 PM.    No chief complaint on file.     Patient is a 34 y.o. female presenting with rash. The history is provided by the patient. No language interpreter was used.  Rash Location:  Shoulder/arm Shoulder/arm rash location:  L arm and R arm Quality: redness   Severity:  Severe Onset quality:  Sudden Timing:  Constant Context: medications   Associated symptoms: no fever and no shortness of breath   HPI Comments: Catherine Underwood is a 34 y.o. female who presents to the Emergency Department complaining of allergic reaction onset this morning to what Catherine Underwood believes is Meloxicam which Catherine Underwood was been prescribed for her arthritis and just took this morning prior to start of symptoms. Pt now complains of rash to the upper aspects of her arms bilaterally with redness and swelling. Catherine Underwood reports history of similar occurrence in the past from the intake of Meloxicam as well where Catherine Underwood had to been seen at the ED. No complaints of fever or SOB. Pt notes Catherine Underwood has tried taking Benadryl to address symptoms with little relief.   Past Medical History  Diagnosis Date  . Arthritis    Past Surgical History  Procedure Laterality Date  . Cesarean section    . Cesarean section     History reviewed. No pertinent family history. History  Substance Use Topics  . Smoking status: Current Every Day Smoker  . Smokeless tobacco: Not on file  . Alcohol Use: Yes   OB History    No data available     Review of Systems  Constitutional: Negative for fever.  Respiratory: Negative for shortness of breath.   Skin: Positive for color change and rash.  All other systems reviewed and are negative.     Allergies  Red dye; Penicillins; and Morphine  and related  Home Medications   Prior to Admission medications   Medication Sig Start Date End Date Taking? Authorizing Provider  acetaminophen (TYLENOL) 650 MG CR tablet Take 1,300 mg by mouth daily as needed for pain (and headache).     Historical Provider, MD  butalbital-acetaminophen-caffeine (FIORICET) 50-325-40 MG per tablet Take 1-2 tablets by mouth every 6 (six) hours as needed for headache. 09/09/14 09/09/15  Fayrene Helper, PA-C  famotidine (PEPCID) 20 MG tablet Take 1 tablet (20 mg total) by mouth once. 09/26/14   Hannah Muthersbaugh, PA-C  hydrocortisone 2.5 % lotion Apply topically 2 (two) times daily. 09/26/14   Hannah Muthersbaugh, PA-C  hydrOXYzine (ATARAX/VISTARIL) 25 MG tablet Take 1 tablet (25 mg total) by mouth every 6 (six) hours. 09/26/14   Hannah Muthersbaugh, PA-C  meloxicam (MOBIC) 15 MG tablet Take 1 tablet (15 mg total) by mouth daily. Patient taking differently: Take 15 mg by mouth daily as needed for pain.  08/13/14   Graylon Good, PA-C  oxyCODONE-acetaminophen (PERCOCET) 5-325 MG per tablet Take 2 tablets by mouth every 4 (four) hours as needed. Patient not taking: Reported on 09/09/2014 10/21/13   Fayrene Helper, PA-C  predniSONE (DELTASONE) 20 MG tablet Take 3 tablets (60 mg total) by mouth daily. 09/25/14   Heather Laisure, PA-C  sulfamethoxazole-trimethoprim (BACTRIM DS) 800-160 MG per tablet Take 1 tablet by mouth 2 (two) times daily. Patient not  taking: Reported on 09/09/2014 10/21/13   Fayrene Helper, PA-C   BP 112/58 mmHg  Pulse 108  Temp(Src) 98.7 F (37.1 C) (Oral)  Resp 16  SpO2 100% Physical Exam  Constitutional: Catherine Underwood is oriented to person, place, and time. Catherine Underwood appears well-developed and well-nourished. No distress.  HENT:  Head: Normocephalic and atraumatic.  Left Ear: External ear normal.  Mouth/Throat: Oropharynx is clear and moist.  Eyes: Conjunctivae and EOM are normal.  Neck: Neck supple. No tracheal deviation present.  Cardiovascular: Normal rate.    Pulmonary/Chest: Effort normal and breath sounds normal. No respiratory distress.  Musculoskeletal: Normal range of motion.  Neurological: Catherine Underwood is alert and oriented to person, place, and time.  Skin: Skin is warm and dry.  Diffuse rash to the bilateral upper extremities and chest and some to the face. No oral mucosal swelling noted  Psychiatric: Catherine Underwood has a normal mood and affect. Her behavior is normal.  Nursing note and vitals reviewed.   ED Course  Procedures (including critical care time) Labs Review Labs Reviewed - No data to display  Imaging Review No results found.   EKG Interpretation None     Medications - No data to display  6:48 PM- Treatment plan was discussed with patient who verbalizes understanding and agrees.      Final diagnoses:  Allergic reaction, initial encounter    Pt is feeling better with solumedrol and benadryl. Will send home with atarax and prednisone  I personally performed the services described in this documentation, which was scribed in my presence. The recorded information has been reviewed and is accurate.t}   Teressa Lower, NP 10/20/14 1931  Elwin Mocha, MD 10/20/14 [phone]

## 2014-11-14 ENCOUNTER — Emergency Department (HOSPITAL_BASED_OUTPATIENT_CLINIC_OR_DEPARTMENT_OTHER)
Admission: RE | Admit: 2014-11-14 | Discharge: 2014-11-14 | Disposition: A | Discharge status: Home / Self Care | Payer: BLUE CROSS/BLUE SHIELD | Source: Ambulatory Visit | Attending: Emergency Medicine | Admitting: Emergency Medicine

## 2014-11-14 ENCOUNTER — Emergency Department (HOSPITAL_COMMUNITY)
Admission: RE | Admit: 2014-11-14 | Discharge: 2014-11-14 | Disposition: A | Discharge status: Home / Self Care | Payer: BLUE CROSS/BLUE SHIELD | Source: Ambulatory Visit | Attending: Emergency Medicine | Admitting: Emergency Medicine

## 2014-11-14 ENCOUNTER — Encounter (HOSPITAL_COMMUNITY): Payer: Self-pay | Admitting: Emergency Medicine

## 2014-11-14 ENCOUNTER — Emergency Department (HOSPITAL_COMMUNITY)
Admission: EM | Admit: 2014-11-14 | Discharge: 2014-11-14 | Disposition: A | Discharge status: Home / Self Care | Payer: BLUE CROSS/BLUE SHIELD | Attending: Emergency Medicine | Admitting: Emergency Medicine

## 2014-11-14 DIAGNOSIS — M79642 Pain in left hand: Secondary | ICD-10-CM

## 2014-11-14 DIAGNOSIS — M79609 Pain in unspecified limb: Secondary | ICD-10-CM | POA: Diagnosis not present

## 2014-11-14 DIAGNOSIS — M1990 Unspecified osteoarthritis, unspecified site: Secondary | ICD-10-CM | POA: Insufficient documentation

## 2014-11-14 DIAGNOSIS — Z720 Tobacco use: Secondary | ICD-10-CM | POA: Diagnosis not present

## 2014-11-14 DIAGNOSIS — M25532 Pain in left wrist: Secondary | ICD-10-CM | POA: Diagnosis not present

## 2014-11-14 DIAGNOSIS — Z7952 Long term (current) use of systemic steroids: Secondary | ICD-10-CM | POA: Diagnosis not present

## 2014-11-14 DIAGNOSIS — Z880 Allergy status to penicillin: Secondary | ICD-10-CM | POA: Diagnosis not present

## 2014-11-14 DIAGNOSIS — Z792 Long term (current) use of antibiotics: Secondary | ICD-10-CM | POA: Insufficient documentation

## 2014-11-14 MED ORDER — HYDROCODONE-ACETAMINOPHEN 5-325 MG PO TABS
2.0000 | ORAL_TABLET | Freq: Once | ORAL | Status: AC
  Administered 2014-11-14: 2 via ORAL
  Filled 2014-11-14: qty 2

## 2014-11-14 MED ORDER — OXYCODONE-ACETAMINOPHEN 5-325 MG PO TABS
2.0000 | ORAL_TABLET | Freq: Once | ORAL | Status: AC
  Administered 2014-11-14: 2 via ORAL
  Filled 2014-11-14: qty 2

## 2014-11-14 MED ORDER — TRAMADOL HCL 50 MG PO TABS: 50.0000 mg | ORAL_TABLET | Freq: Four times a day (QID) | ORAL | Status: DC | PRN

## 2014-11-14 MED ORDER — ONDANSETRON 8 MG PO TBDP
8.0000 mg | ORAL_TABLET | Freq: Once | ORAL | Status: AC
  Administered 2014-11-14: 8 mg via ORAL
  Filled 2014-11-14: qty 1

## 2014-11-14 NOTE — Discharge Instructions (Signed)
Your doppler was negative. This may be related to carpal tunnel syndrome. You will need to follow up with a hand surgeon. You have no emergent cause of pain today.   Wrist Pain Wrist injuries are frequent in adults and children. A sprain is an injury to the ligaments that hold your bones together. A strain is an injury to muscle or muscle cord-like structures (tendons) from stretching or pulling. Generally, when wrists are moderately tender to touch following a fall or injury, a break in the bone (fracture) may be present. Most wrist sprains or strains are better in 3 to 5 days, but complete healing may take several weeks. HOME CARE INSTRUCTIONS   Put ice on the injured area.  Put ice in a plastic bag.  Place a towel between your skin and the bag.  Leave the ice on for 15-20 minutes, 3-4 times a day, for the first 2 days, or as directed by your health care provider.  Keep your arm raised above the level of your heart whenever possible to reduce swelling and pain.  Rest the injured area for at least 48 hours or as directed by your health care provider.  If a splint or elastic bandage has been applied, use it for as long as directed by your health care provider or until seen by a health care provider for a follow-up exam.  Only take over-the-counter or prescription medicines for pain, discomfort, or fever as directed by your health care provider.  Keep all follow-up appointments. You may need to follow up with a specialist or have follow-up X-rays. Improvement in pain level is not a guarantee that you did not fracture a bone in your wrist. The only way to determine whether or not you have a broken bone is by X-ray. SEEK IMMEDIATE MEDICAL CARE IF:   Your fingers are swollen, very red, white, or cold and blue.  Your fingers are numb or tingling.  You have increasing pain.  You have difficulty moving your fingers. MAKE SURE YOU:   Understand these instructions.  Will watch your  condition.  Will get help right away if you are not doing well or get worse. Document Released: 03/05/2005 Document Revised: 05/31/2013 Document Reviewed: 07/17/2010 [facility] Patient Information 2015 [place], [place]. This information is not intended to replace advice given to you by your health care provider. Make sure you discuss any questions you have with your health care provider.

## 2014-11-14 NOTE — ED Provider Notes (Signed)
CSN: [id number]     Arrival date & time 11/14/14  0225 History   First MD Initiated Contact with Patient 11/14/14 [phone]     Chief Complaint  Patient presents with  . Hand Pain     (Consider location/radiation/quality/duration/timing/severity/associated sxs/prior Treatment) HPI   Social 34 year old female with a past medical history of arthritis who presents emergency Department with chief complaint of severe left arm pain. Patient states she has no previous history of pain, swelling, or numbness in the arm. Patient states that yesterday she began having some aching pain in her left forearm. She states that it has progressively worsened and now complains of severe pain, numbness, and cold sensation in the fingertips since yesterday. She rates her pain at 10 out of 10. She states she has difficulty moving her fingers. She denies any injury to the area. She is a current some day smoker. She has no previous history of carpal tunnel over a node syndrome. She has no other rheumatologic disorders. 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  Past Medical History  Diagnosis Date  . Arthritis    Past Surgical History  Procedure Laterality Date  . Cesarean section    . Cesarean section     History reviewed. No pertinent family history. History  Substance Use Topics  . Smoking status: Current Some Day Smoker    Types: Cigarettes  . Smokeless tobacco: Not on file  . Alcohol Use: No   OB History    No data available     Review of Systems  Ten systems reviewed and are negative for acute change, except as noted in the HPI.    Allergies  Red dye; Penicillins; and Morphine and related  Home Medications   Prior to Admission medications   Medication Sig Start Date End Date Taking? Authorizing Provider  acetaminophen (TYLENOL) 650 MG CR tablet Take 1,300 mg by mouth daily as needed for pain (and headache).     Historical Provider, MD  butalbital-acetaminophen-caffeine  (FIORICET) 50-325-40 MG per tablet Take 1-2 tablets by mouth every 6 (six) hours as needed for headache. 09/09/14 09/09/15  Fayrene Helper, PA-C  famotidine (PEPCID) 20 MG tablet Take 1 tablet (20 mg total) by mouth once. 09/26/14   Hannah Muthersbaugh, PA-C  hydrocortisone 2.5 % lotion Apply topically 2 (two) times daily. 09/26/14   Hannah Muthersbaugh, PA-C  hydrOXYzine (ATARAX/VISTARIL) 25 MG tablet Take 1 tablet (25 mg total) by mouth every 6 (six) hours. 10/20/14   Teressa Lower, NP  meloxicam (MOBIC) 15 MG tablet Take 1 tablet (15 mg total) by mouth daily. Patient taking differently: Take 15 mg by mouth daily as needed for pain.  08/13/14   Graylon Good, PA-C  oxyCODONE-acetaminophen (PERCOCET) 5-325 MG per tablet Take 2 tablets by mouth every 4 (four) hours as needed. Patient not taking: Reported on 09/09/2014 10/21/13   Fayrene Helper, PA-C  predniSONE (DELTASONE) 20 MG tablet Take 2 tablets (40 mg total) by mouth daily. 10/20/14   Teressa Lower, NP  sulfamethoxazole-trimethoprim (BACTRIM DS) 800-160 MG per tablet Take 1 tablet by mouth 2 (two) times daily. Patient not taking: Reported on 09/09/2014 10/21/13   Fayrene Helper, PA-C   BP 129/78 mmHg  Pulse 103  Temp(Src) 98.2 F (36.8 C) (Oral)  Resp 22  SpO2 100% Physical Exam  Constitutional: She is oriented to person, place, and time. She appears well-developed and well-nourished. No distress.  HENT:  Head: Normocephalic and atraumatic.  Eyes: Conjunctivae are normal.  No scleral icterus.  Neck: Normal range of motion.  Cardiovascular: Normal rate, regular rhythm and normal heart sounds.  Exam reveals no gallop and no friction rub.   No murmur heard. Left forearm with mild swelling into the distal fingertips. There is some purple discoloration in the first, second and third fingers. Fingertips are cool to the touch in comparison to the right hand. Distal pulses intact. She is exquisitely tender to palpation. She has abnormal sensation in the hand.  Unable to perform. Strong grip. Movement of the fingertips causes severe pain.  Pulmonary/Chest: Effort normal and breath sounds normal. No respiratory distress.  Abdominal: Soft. Bowel sounds are normal. She exhibits no distension and no mass. There is no tenderness. There is no guarding.  Neurological: She is alert and oriented to person, place, and time.  Skin: Skin is warm and dry. She is not diaphoretic.  Nursing note and vitals reviewed.   ED Course  Procedures (including critical care time) Labs Review Labs Reviewed - No data to display  Imaging Review No results found.   EKG Interpretation None      MDM   Final diagnoses:  Wrist pain, acute, left  Hand pain, left    Patient with apparent discoloration and swelling of the wrist. I have ordered an ultrasound of the venous and arterial system in the left upper extremity.     Filed Vitals:   11/14/14 0229 11/14/14 0920  BP: 129/78 123/70  Pulse: 103 85  Temp: 98.2 F (36.8 C)   TempSrc: Oral   Resp: 22 20  SpO2: 100% 100%   Patient with negative venous and arterial duplex of the left upper extremity. I suspect this is related to carpal tunnel syndrome. Patient may also have a component of Raynaud's syndrome. Do not feel there is any emergent cause of her pain today. No previous injuries. Patient will be discharged with tramadol, NSAIDs, and left wrist brace. I referred the patient to orthopedics. She appears safe for discharge at this time    Arthor Captain, PA-C 11/14/14 1612  Rolland Porter, MD 11/24/14 [phone]

## 2014-11-14 NOTE — ED Notes (Signed)
Pt has called the charge nurse twice from the lobby complaining about her wait time, the wait has been explained to patient.

## 2014-11-14 NOTE — ED Notes (Signed)
Pt is c/o pain from her left forearm down to her hand  Pt states she cannot move her hand without pain and her fingers are numb  Pt states her fingers feel cold   Pt has good radial pulse and her hand is warm to touch  Denies injury

## 2014-11-14 NOTE — Progress Notes (Signed)
*  PRELIMINARY RESULTS* Vascular Ultrasound Left upper extremity Arterial and Venous Duplex has been completed.  Preliminary findings:   No evidence of DVT. Adequate arterial flow with triphasic waveforms to wrist.    Farrel Demark, RDMS, RVT  11/14/2014, 9:11 AM

## 2014-11-29 ENCOUNTER — Emergency Department (HOSPITAL_COMMUNITY)
Admission: EM | Admit: 2014-11-29 | Discharge: 2014-11-29 | Disposition: A | Discharge status: Home / Self Care | Payer: BLUE CROSS/BLUE SHIELD | Attending: Emergency Medicine | Admitting: Emergency Medicine

## 2014-11-29 ENCOUNTER — Encounter (HOSPITAL_COMMUNITY): Payer: Self-pay | Admitting: Emergency Medicine

## 2014-11-29 DIAGNOSIS — G8929 Other chronic pain: Secondary | ICD-10-CM | POA: Diagnosis not present

## 2014-11-29 DIAGNOSIS — M1712 Unilateral primary osteoarthritis, left knee: Secondary | ICD-10-CM | POA: Diagnosis not present

## 2014-11-29 DIAGNOSIS — Z880 Allergy status to penicillin: Secondary | ICD-10-CM | POA: Diagnosis not present

## 2014-11-29 DIAGNOSIS — Z720 Tobacco use: Secondary | ICD-10-CM | POA: Insufficient documentation

## 2014-11-29 DIAGNOSIS — Z8739 Personal history of other diseases of the musculoskeletal system and connective tissue: Secondary | ICD-10-CM

## 2014-11-29 DIAGNOSIS — M25562 Pain in left knee: Secondary | ICD-10-CM

## 2014-11-29 MED ORDER — IBUPROFEN 600 MG PO TABS: 600.0000 mg | ORAL_TABLET | Freq: Four times a day (QID) | ORAL | Status: DC | PRN

## 2014-11-29 NOTE — ED Provider Notes (Signed)
CSN: [id number]     Arrival date & time 11/29/14  1851 History  This chart was scribed for Junius Finner, PA-C, working with Pricilla Loveless, MD by Chestine Spore, ED Scribe. The patient was seen in room WTR6/WTR6 at 7:32 PM.    Chief Complaint  Patient presents with  . Knee Pain      The history is provided by the patient. No language interpreter was used.    HPI Comments: Catherine Underwood is a 34 y.o. female with a medical hx of arthritis who presents to the Emergency Department complaining of worsening left knee pain. She notes that she has chronic left knee pain as a result of arthritis. She is waiting to go to a rheumatologist on 12/12/14. She denies any new injuries. She reports that there is no pain with her knee at rest. She notes that she has seen an orthopedist and that is who referred her to the rheumatologist. She states that she has tried tylenol arthritis and aleve with no relief for her symptoms. She denies joint swelling, color change, and any other symptoms. She denies having a PCP.  Pt notes she took tramadol in the past but did not like how it made her feel. States she was also prescribed meloxicam in the past but had a severe rash.  States she can take regular ibuprofen w/o reaction.    Past Medical History  Diagnosis Date  . Arthritis    Past Surgical History  Procedure Laterality Date  . Cesarean section    . Cesarean section     No family history on file. History  Substance Use Topics  . Smoking status: Current Some Day Smoker    Types: Cigarettes  . Smokeless tobacco: Not on file  . Alcohol Use: No   OB History    No data available     Review of Systems  Musculoskeletal: Positive for arthralgias. Negative for joint swelling.  Skin: Negative for color change.      Allergies  Red dye; Meloxicam; Penicillins; and Morphine and related  Home Medications   Prior to Admission medications   Medication Sig Start Date End Date Taking? Authorizing Provider   acetaminophen (TYLENOL) 650 MG CR tablet Take 1,300 mg by mouth daily as needed for pain (and headache).     Historical Provider, MD  butalbital-acetaminophen-caffeine (FIORICET) 50-325-40 MG per tablet Take 1-2 tablets by mouth every 6 (six) hours as needed for headache. 09/09/14 09/09/15  Fayrene Helper, PA-C  famotidine (PEPCID) 20 MG tablet Take 1 tablet (20 mg total) by mouth once. Patient not taking: Reported on 11/14/2014 09/26/14   Dahlia Client Muthersbaugh, PA-C  hydrocortisone 2.5 % lotion Apply topically 2 (two) times daily. Patient not taking: Reported on 11/14/2014 09/26/14   Dahlia Client Muthersbaugh, PA-C  hydrOXYzine (ATARAX/VISTARIL) 25 MG tablet Take 1 tablet (25 mg total) by mouth every 6 (six) hours. Patient not taking: Reported on 11/14/2014 10/20/14   Teressa Lower, NP  ibuprofen (ADVIL,MOTRIN) 600 MG tablet Take 1 tablet (600 mg total) by mouth every 6 (six) hours as needed. 11/29/14   Junius Finner, PA-C  meloxicam (MOBIC) 15 MG tablet Take 1 tablet (15 mg total) by mouth daily. Patient not taking: Reported on 11/14/2014 08/13/14   Graylon Good, PA-C  predniSONE (DELTASONE) 20 MG tablet Take 2 tablets (40 mg total) by mouth daily. Patient not taking: Reported on 11/14/2014 10/20/14   Teressa Lower, NP  RECOMBIVAX HB injection Inject 10 mcg into the skin once. 10/28/14  Historical Provider, MD  traMADol (ULTRAM) 50 MG tablet Take 1 tablet (50 mg total) by mouth every 6 (six) hours as needed. 11/14/14   Arthor Captain, PA-C   BP 111/72 mmHg  Pulse 89  Temp(Src) 98.3 F (36.8 C) (Oral)  Resp 18  SpO2 99% Physical Exam  Constitutional: She is oriented to person, place, and time. She appears well-developed and well-nourished. No distress.  HENT:  Head: Normocephalic and atraumatic.  Eyes: EOM are normal.  Neck: Neck supple. No tracheal deviation present.  Cardiovascular: Normal rate.   Pulmonary/Chest: Effort normal. No respiratory distress.  Musculoskeletal: Normal range of motion.       Left  knee: She exhibits normal range of motion, no swelling and no deformity. No medial joint line and no lateral joint line tenderness noted.       Left lower leg: Normal.  Left knee: no obvious deformity. No edema tenderness to superior aspect of knee with FROM. No medial or lateral joint space tenderness. Calf is soft and non-tender   Neurological: She is alert and oriented to person, place, and time.  Skin: Skin is warm, dry and intact. No ecchymosis noted. No erythema.  Psychiatric: She has a normal mood and affect. Her behavior is normal.  Nursing note and vitals reviewed.   ED Course  Procedures (including critical care time) DIAGNOSTIC STUDIES: Oxygen Saturation is 99% on RA, nl by my interpretation.    COORDINATION OF CARE: 7:34 PM-Discussed treatment plan which includes ibuprofen, steroid Rx with pt at bedside and pt agreed to plan.   Labs Review Labs Reviewed - No data to display  Imaging Review No results found.   EKG Interpretation None      MDM   Final diagnoses:  Left knee pain  History of arthritis   Pt is a 34yo female presenting to ED with exacerbation of Left knee pain. Hx of arthritis. Has f/u with rheumatology on 12/12/14. No new injury. No imaging indicated for today. Encouraged to f/u as previously scheduled. Also encouraged to establish care with a PCP.    I personally performed the services described in this documentation, which was scribed in my presence. The recorded information has been reviewed and is accurate.   Junius Finner, PA-C 11/29/14 1948  Pricilla Loveless, MD 12/01/14 1044

## 2014-11-29 NOTE — ED Notes (Addendum)
Pt reports worsening acute on chronic left knee pain as result of arthritis.

## 2014-12-03 ENCOUNTER — Encounter (HOSPITAL_COMMUNITY): Payer: Self-pay | Admitting: Emergency Medicine

## 2014-12-03 ENCOUNTER — Emergency Department (HOSPITAL_COMMUNITY)
Admission: EM | Admit: 2014-12-03 | Discharge: 2014-12-03 | Disposition: A | Discharge status: Home / Self Care | Payer: BLUE CROSS/BLUE SHIELD | Attending: Emergency Medicine | Admitting: Emergency Medicine

## 2014-12-03 DIAGNOSIS — Z880 Allergy status to penicillin: Secondary | ICD-10-CM | POA: Diagnosis not present

## 2014-12-03 DIAGNOSIS — Z720 Tobacco use: Secondary | ICD-10-CM | POA: Diagnosis not present

## 2014-12-03 DIAGNOSIS — M79642 Pain in left hand: Secondary | ICD-10-CM | POA: Diagnosis present

## 2014-12-03 DIAGNOSIS — M79602 Pain in left arm: Secondary | ICD-10-CM

## 2014-12-03 MED ORDER — TRAMADOL HCL 50 MG PO TABS: 50.0000 mg | ORAL_TABLET | Freq: Four times a day (QID) | ORAL | Status: DC | PRN

## 2014-12-03 MED ORDER — KETOROLAC TROMETHAMINE 60 MG/2ML IM SOLN
60.0000 mg | Freq: Once | INTRAMUSCULAR | Status: AC
  Administered 2014-12-03: 60 mg via INTRAMUSCULAR
  Filled 2014-12-03: qty 2

## 2014-12-03 NOTE — ED Provider Notes (Signed)
CSN: [id number]     Arrival date & time 12/03/14  0111 History   First MD Initiated Contact with Patient 12/03/14 0216     Chief Complaint  Patient presents with  . Hand Pain     (Consider location/radiation/quality/duration/timing/severity/associated sxs/prior Treatment) Patient is a 34 y.o. female presenting with hand pain. The history is provided by the patient. No language interpreter was used.  Hand Pain Pertinent negatives include no chills, fever, neck pain or weakness. Associated symptoms comments: She returns to the emergency department with recurrent pain in the hand and elbow of left arm. No muscular soreness, swelling of the arm or affected joints and no discoloration. She denies fever. No other joint pain. She was seen on 11/14/14 for same complaint and on 6/22 with complaint of knee pain without mention of left wrist/elbow pain. She reports she has a scheduled appointment for outpatient follow up on 12/12/14.Marland Kitchen    Past Medical History  Diagnosis Date  . Arthritis    Past Surgical History  Procedure Laterality Date  . Cesarean section    . Cesarean section     No family history on file. History  Substance Use Topics  . Smoking status: Current Some Day Smoker    Types: Cigarettes  . Smokeless tobacco: Not on file  . Alcohol Use: No   OB History    No data available     Review of Systems  Constitutional: Negative for fever and chills.  Musculoskeletal: Negative for neck pain.       See HPI.  Skin: Negative.  Negative for color change and wound.  Neurological: Negative.  Negative for weakness.       She complains of tingling of left UE.      Allergies  Red dye; Meloxicam; Penicillins; and Morphine and related  Home Medications   Prior to Admission medications   Medication Sig Start Date End Date Taking? Authorizing Provider  acetaminophen (TYLENOL) 650 MG CR tablet Take 1,300 mg by mouth daily as needed for pain (and headache).    Yes Historical Provider, MD   ibuprofen (ADVIL,MOTRIN) 600 MG tablet Take 1 tablet (600 mg total) by mouth every 6 (six) hours as needed. Patient taking differently: Take 600 mg by mouth every 6 (six) hours as needed (for pain.).  11/29/14  Yes Junius Finner, PA-C  butalbital-acetaminophen-caffeine (FIORICET) 50-325-40 MG per tablet Take 1-2 tablets by mouth every 6 (six) hours as needed for headache. Patient not taking: Reported on 12/03/2014 09/09/14 09/09/15  Fayrene Helper, PA-C  famotidine (PEPCID) 20 MG tablet Take 1 tablet (20 mg total) by mouth once. Patient not taking: Reported on 11/14/2014 09/26/14   Dahlia Client Muthersbaugh, PA-C  hydrocortisone 2.5 % lotion Apply topically 2 (two) times daily. Patient not taking: Reported on 11/14/2014 09/26/14   Dahlia Client Muthersbaugh, PA-C  hydrOXYzine (ATARAX/VISTARIL) 25 MG tablet Take 1 tablet (25 mg total) by mouth every 6 (six) hours. Patient not taking: Reported on 11/14/2014 10/20/14   Teressa Lower, NP  meloxicam (MOBIC) 15 MG tablet Take 1 tablet (15 mg total) by mouth daily. Patient not taking: Reported on 11/14/2014 08/13/14   Graylon Good, PA-C  predniSONE (DELTASONE) 20 MG tablet Take 2 tablets (40 mg total) by mouth daily. Patient not taking: Reported on 11/14/2014 10/20/14   Teressa Lower, NP  traMADol (ULTRAM) 50 MG tablet Take 1 tablet (50 mg total) by mouth every 6 (six) hours as needed. Patient not taking: Reported on 12/03/2014 11/14/14   Arthor Captain, PA-C  BP 120/71 mmHg  Pulse 97  Temp(Src) 98.3 F (36.8 C) (Oral)  Resp 20  Ht 4\' 11"  (1.499 m)  Wt 176 lb 12.8 oz (80.196 kg)  BMI 35.69 kg/m2  SpO2 99% Physical Exam  Constitutional: She is oriented to person, place, and time. She appears well-developed and well-nourished.  Neck: Normal range of motion.  Cardiovascular: Intact distal pulses.   Pulmonary/Chest: Effort normal.  Musculoskeletal: She exhibits no edema.  Left upper extremity is unremarkable in appearance. No redness, joint or muscular swelling. FROM all  joints of hand, wrist and elbow. Capillary refill WNL.  Neurological: She is alert and oriented to person, place, and time.  Skin: Skin is warm and dry.    ED Course  Procedures (including critical care time) Labs Review Labs Reviewed - No data to display  Imaging Review No results found.   EKG Interpretation None      MDM   Final diagnoses:  None    1. Left arm pain  No findings to suggest acute process in patient with recurrent symptoms. She is encouraged to follow up per scheduled appointment for outpatient management.     , PA-C 12/03/14 0243  12/05/14, MD 12/03/14 [phone]

## 2014-12-03 NOTE — Discharge Instructions (Signed)

## 2014-12-03 NOTE — ED Notes (Signed)
Pt from home c/o left elbow and left hand pain and tingling since yesterday.

## 2016-06-29 ENCOUNTER — Encounter (HOSPITAL_COMMUNITY): Payer: Self-pay | Admitting: Emergency Medicine

## 2016-06-29 ENCOUNTER — Emergency Department (HOSPITAL_COMMUNITY)
Admission: EM | Admit: 2016-06-29 | Discharge: 2016-06-29 | Disposition: A | Discharge status: Home / Self Care | Payer: BLUE CROSS/BLUE SHIELD | Attending: Emergency Medicine | Admitting: Emergency Medicine

## 2016-06-29 DIAGNOSIS — Z79899 Other long term (current) drug therapy: Secondary | ICD-10-CM | POA: Insufficient documentation

## 2016-06-29 DIAGNOSIS — R112 Nausea with vomiting, unspecified: Secondary | ICD-10-CM | POA: Diagnosis present

## 2016-06-29 DIAGNOSIS — R197 Diarrhea, unspecified: Secondary | ICD-10-CM | POA: Diagnosis not present

## 2016-06-29 DIAGNOSIS — F17210 Nicotine dependence, cigarettes, uncomplicated: Secondary | ICD-10-CM | POA: Insufficient documentation

## 2016-06-29 LAB — URINALYSIS, ROUTINE W REFLEX MICROSCOPIC
BILIRUBIN URINE: NEGATIVE
GLUCOSE, UA: NEGATIVE mg/dL
HGB URINE DIPSTICK: NEGATIVE
KETONES UR: NEGATIVE mg/dL
NITRITE: NEGATIVE
PH: 5 (ref 5.0–8.0)
Protein, ur: NEGATIVE mg/dL
SPECIFIC GRAVITY, URINE: 1.023 (ref 1.005–1.030)

## 2016-06-29 LAB — CBC
HCT: 40.7 % (ref 36.0–46.0)
Hemoglobin: 13.6 g/dL (ref 12.0–15.0)
MCH: 27.9 pg (ref 26.0–34.0)
MCHC: 33.4 g/dL (ref 30.0–36.0)
MCV: 83.4 fL (ref 78.0–100.0)
PLATELETS: 245 10*3/uL (ref 150–400)
RBC: 4.88 MIL/uL (ref 3.87–5.11)
RDW: 13.1 % (ref 11.5–15.5)
WBC: 4.8 10*3/uL (ref 4.0–10.5)

## 2016-06-29 LAB — COMPREHENSIVE METABOLIC PANEL
ALT: 18 U/L (ref 14–54)
AST: 21 U/L (ref 15–41)
Albumin: 4 g/dL (ref 3.5–5.0)
Alkaline Phosphatase: 64 U/L (ref 38–126)
Anion gap: 9 (ref 5–15)
BILIRUBIN TOTAL: 0.6 mg/dL (ref 0.3–1.2)
BUN: 9 mg/dL (ref 6–20)
CALCIUM: 9.2 mg/dL (ref 8.9–10.3)
CO2: 25 mmol/L (ref 22–32)
CREATININE: 0.63 mg/dL (ref 0.44–1.00)
Chloride: 104 mmol/L (ref 101–111)
GFR calc non Af Amer: >60 mL/min (ref >60)
Glucose, Bld: 91 mg/dL (ref 65–99)
Potassium: 3.3 mmol/L — ABNORMAL LOW (ref 3.5–5.1)
Sodium: 138 mmol/L (ref 135–145)
TOTAL PROTEIN: 7.8 g/dL (ref 6.5–8.1)

## 2016-06-29 LAB — PREGNANCY, URINE: Preg Test, Ur: NEGATIVE

## 2016-06-29 LAB — LIPASE, BLOOD: Lipase: 18 U/L (ref 11–51)

## 2016-06-29 MED ORDER — ACETAMINOPHEN 325 MG PO TABS
650.0000 mg | ORAL_TABLET | Freq: Once | ORAL | Status: AC
  Administered 2016-06-29: 650 mg via ORAL
  Filled 2016-06-29: qty 2

## 2016-06-29 MED ORDER — SODIUM CHLORIDE 0.9 % IV BOLUS (SEPSIS): 500.0000 mL | Freq: Once | INTRAVENOUS | Status: DC

## 2016-06-29 MED ORDER — SODIUM CHLORIDE 0.9 % IV BOLUS (SEPSIS)
1000.0000 mL | Freq: Once | INTRAVENOUS | Status: AC
  Administered 2016-06-29: 1000 mL via INTRAVENOUS

## 2016-06-29 MED ORDER — LACTATED RINGERS IV BOLUS (SEPSIS)
1000.0000 mL | Freq: Once | INTRAVENOUS | Status: AC
  Administered 2016-06-29: 1000 mL via INTRAVENOUS

## 2016-06-29 MED ORDER — ONDANSETRON HCL 4 MG/2ML IJ SOLN
4.0000 mg | Freq: Once | INTRAMUSCULAR | Status: AC
  Administered 2016-06-29: 4 mg via INTRAVENOUS
  Filled 2016-06-29: qty 2

## 2016-06-29 MED ORDER — ONDANSETRON HCL 4 MG PO TABS: 4.0000 mg | ORAL_TABLET | Freq: Three times a day (TID) | ORAL | 0 refills | Status: DC | PRN

## 2016-06-29 NOTE — Discharge Instructions (Signed)
Please return if vomiting and diarrhea or fever persist despite prescribed therapy or if condition worsens.

## 2016-06-29 NOTE — ED Provider Notes (Signed)
[facility] DEPT Provider Note   CSN: [id number] Arrival date & time: 06/29/16  1257     History   Chief Complaint Chief Complaint  Patient presents with  . Emesis  . Diarrhea    HPI Catherine Underwood is a 36 y.o. female otherwise healthy presenting with 1 day of nonbloody diarrhea, emesis and waxing and waning abdominal discomfort associated with diarrhea. Patient states that she had an episode of diarrhea earlier today, went to work felt lower back pain which then moved to her abdomen with more diarrhea. She try to take Pepto-Bismol but vomited. She reports subjective fevers and chills. She hasn't taken anything for pain or fever. She denies dysuria, hematuria, blood in the stool, shortness of breath, chest pain, palpitations, or any other symptoms. Denies history of ill contacts. She has not been eating or drinking all day.  HPI  Past Medical History:  Diagnosis Date  . Arthritis     Patient Active Problem List   Diagnosis Date Noted  . Preventative health care 04/19/2011    Past Surgical History:  Procedure Laterality Date  . CESAREAN SECTION    . CESAREAN SECTION      OB History    No data available       Home Medications    Prior to Admission medications   Medication Sig Start Date End Date Taking? Authorizing Provider  methotrexate (RHEUMATREX) 2.5 MG tablet Take 15 mg by mouth every 7 (seven) days. SUNDAY 05/23/16  Yes Historical Provider, MD  predniSONE (DELTASONE) 20 MG tablet Take 2 tablets (40 mg total) by mouth daily. 10/20/14  Yes Teressa Lower, NP  acetaminophen (TYLENOL) 650 MG CR tablet Take 1,300 mg by mouth daily as needed for pain (and headache).     Historical Provider, MD  famotidine (PEPCID) 20 MG tablet Take 1 tablet (20 mg total) by mouth once. Patient not taking: Reported on 11/14/2014 09/26/14   Dahlia Client Muthersbaugh, PA-C  hydrocortisone 2.5 % lotion Apply topically 2 (two) times daily. Patient not taking: Reported on 11/14/2014 09/26/14    Dahlia Client Muthersbaugh, PA-C  hydrOXYzine (ATARAX/VISTARIL) 25 MG tablet Take 1 tablet (25 mg total) by mouth every 6 (six) hours. Patient not taking: Reported on 11/14/2014 10/20/14   Teressa Lower, NP  ibuprofen (ADVIL,MOTRIN) 600 MG tablet Take 1 tablet (600 mg total) by mouth every 6 (six) hours as needed. Patient not taking: Reported on 06/29/2016 11/29/14   Junius Finner, PA-C  meloxicam (MOBIC) 15 MG tablet Take 1 tablet (15 mg total) by mouth daily. Patient not taking: Reported on 11/14/2014 08/13/14   Graylon Good, PA-C  traMADol (ULTRAM) 50 MG tablet Take 1 tablet (50 mg total) by mouth every 6 (six) hours as needed. Patient not taking: Reported on 06/29/2016 12/03/14   Elpidio Anis, PA-C    Family History No family history on file.  Social History Social History  Substance Use Topics  . Smoking status: Current Some Day Smoker    Types: Cigarettes  . Smokeless tobacco: Not on file  . Alcohol use No     Allergies   Red dye; Meloxicam; Penicillins; and Morphine and related   Review of Systems Review of Systems  Constitutional: Positive for chills and fever.       Patient reports subjective fevers and chills. She states that she feels hot and cold at the same time.  HENT: Negative for congestion, ear pain, sinus pain, sinus pressure and sore throat.   Eyes: Negative for pain and visual disturbance.  Respiratory: Negative for cough, chest tightness, shortness of breath and wheezing.   Cardiovascular: Negative for chest pain, palpitations and leg swelling.  Gastrointestinal: Positive for abdominal pain, diarrhea, nausea and vomiting. Negative for abdominal distention and blood in stool.  Genitourinary: Negative for difficulty urinating, dysuria, frequency, hematuria, pelvic pain, vaginal bleeding and vaginal discharge.  Musculoskeletal: Negative for back pain, myalgias, neck pain and neck stiffness.  Skin: Negative for color change, pallor and rash.  Neurological: Negative for  dizziness, seizures, syncope, weakness, light-headedness and numbness.  All other systems reviewed and are negative.    Physical Exam Updated Vital Signs BP 125/80   Pulse 97   Temp 99.4 F (37.4 C) (Oral)   Resp 18   Wt 83.5 kg   SpO2 100%   BMI 37.16 kg/m   Physical Exam  Constitutional: She appears well-developed and well-nourished. No distress.  Afebrile, nontoxic appearing, sitting comfortably in bed in no acute distress.  HENT:  Head: Normocephalic and atraumatic.  Mouth/Throat: Oropharynx is clear and moist. No oropharyngeal exudate.  Eyes: Conjunctivae and EOM are normal. Right eye exhibits no discharge. Left eye exhibits no discharge.  Neck: Normal range of motion. Neck supple.  Cardiovascular: Normal rate, regular rhythm and normal heart sounds.   No murmur heard. Pulmonary/Chest: Effort normal and breath sounds normal. No respiratory distress.  Abdominal: Soft. Bowel sounds are normal. She exhibits no distension and no mass. There is no tenderness. There is no rebound and no guarding.  Patient only experiences colicky waxing and waning abdominal discomfort with the diarrhea. She was nontender to palpation on exam and nondistended.  Musculoskeletal: She exhibits no edema.  Neurological: She is alert.  Skin: Skin is warm and dry. She is not diaphoretic.  Psychiatric: She has a normal mood and affect. Her behavior is normal.  Nursing note and vitals reviewed.    ED Treatments / Results  Labs (all labs ordered are listed, but only abnormal results are displayed) Labs Reviewed  COMPREHENSIVE METABOLIC PANEL - Abnormal; Notable for the following:       Result Value   Potassium 3.3 (*)    All other components within normal limits  URINALYSIS, ROUTINE W REFLEX MICROSCOPIC - Abnormal; Notable for the following:    APPearance HAZY (*)    Leukocytes, UA TRACE (*)    Bacteria, UA FEW (*)    Squamous Epithelial / LPF 6-30 (*)    All other components within normal  limits  LIPASE, BLOOD  CBC  PREGNANCY, URINE  POC URINE PREG, ED    EKG  EKG Interpretation None       Radiology No results found.  Procedures Procedures (including critical care time)  Medications Ordered in ED Medications  ondansetron (ZOFRAN) injection 4 mg (4 mg Intravenous Given 06/29/16 1811)  sodium chloride 0.9 % bolus 1,000 mL (0 mLs Intravenous Stopped 06/29/16 2018)  lactated ringers bolus 1,000 mL (0 mLs Intravenous Stopped 06/29/16 2018)  acetaminophen (TYLENOL) tablet 650 mg (650 mg Oral Given 06/29/16 2025)     Initial Impression / Assessment and Plan / ED Course  I have reviewed the triage vital signs and the nursing notes.  Pertinent labs & imaging results that were available during my care of the patient were reviewed by me and considered in my medical decision making (see chart for details).    Otherwise healthy 36 year old female presenting with 1 day of diarrhea, emesis and subjective fevers and chills. On exam she is well appearing  and comfortable. She  is still having some nausea. No abdominal tenderness on exam. She only experiences discomfort when having episodes of diarrhea and vomiting.  She was tachycardic and slightly hypokalemic. Initiated IV bolus lactated Ringer.Zofran for nausea. She was satting well and no chest discomfort or shortness of breath. Labs unremarkable. Reassuring exam. No abdominal tenderness to palpation. She is afebrile and non-toxic appearing. No acute distress.  On reassessment, she was feeling better. No pain. Nausea was well controlled. She did not have any other episodes of diarrhea while in the ED or vomiting. Tachycardia resolved and patient overall improved while in the Ed. PO trial successful.  Discharge home with close follow up with PCP and symptomatic relief. Patient was provided with a list of providers in the Area. Discussed strict return precautions. Patient was advised to return to the emergency department if  experiencing any worsening of symptoms. She understood instructions and agreed with discharge plan. Patient was discussed with Dr. Erma Heritage who agrees with assessment and plan.  Final Clinical Impressions(s) / ED Diagnoses   Final diagnoses:  Nausea vomiting and diarrhea    New Prescriptions New Prescriptions   No medications on file     Gregary Cromer 06/29/16 2109    Shaune Pollack, MD 06/30/16 [phone]

## 2016-06-29 NOTE — ED Notes (Signed)
Pt

## 2016-06-29 NOTE — ED Notes (Signed)
Pt verbalized understanding discharge instructions and denies any further needs or questions at this time. VS stable, ambulatory and steady gait.   

## 2016-09-24 ENCOUNTER — Encounter (HOSPITAL_COMMUNITY): Payer: Self-pay | Admitting: Emergency Medicine

## 2016-09-24 ENCOUNTER — Ambulatory Visit (HOSPITAL_COMMUNITY)
Admission: EM | Admit: 2016-09-24 | Discharge: 2016-09-24 | Disposition: A | Discharge status: Home / Self Care | Payer: BLUE CROSS/BLUE SHIELD | Attending: Emergency Medicine | Admitting: Emergency Medicine

## 2016-09-24 DIAGNOSIS — M79642 Pain in left hand: Secondary | ICD-10-CM | POA: Diagnosis not present

## 2016-09-24 DIAGNOSIS — M069 Rheumatoid arthritis, unspecified: Secondary | ICD-10-CM

## 2016-09-24 DIAGNOSIS — M06032 Rheumatoid arthritis without rheumatoid factor, left wrist: Secondary | ICD-10-CM | POA: Diagnosis not present

## 2016-09-24 MED ORDER — METHYLPREDNISOLONE SODIUM SUCC 125 MG IJ SOLR
INTRAMUSCULAR | Status: AC
  Filled 2016-09-24: qty 2

## 2016-09-24 MED ORDER — METHYLPREDNISOLONE SODIUM SUCC 125 MG IJ SOLR
125.0000 mg | Freq: Once | INTRAMUSCULAR | Status: AC
  Administered 2016-09-24: 125 mg via INTRAMUSCULAR

## 2016-09-24 MED ORDER — PREDNISONE 10 MG PO TABS: ORAL_TABLET | ORAL | 0 refills | Status: DC

## 2016-09-24 MED ORDER — TRAMADOL HCL 50 MG PO TABS: 50.0000 mg | ORAL_TABLET | Freq: Two times a day (BID) | ORAL | 0 refills | Status: DC | PRN

## 2016-09-24 NOTE — ED Triage Notes (Signed)
Pt. Stated, I have arthritis in my left hand and I have a splint and today its swollen and in pain.

## 2016-09-24 NOTE — Discharge Instructions (Signed)
You were given a shot of SoluMedrol today to help with pain and swelling. Recommend start Prednisone tomorrow- 40mg  daily for 3 days then 30mg  daily for 3 days then 20mg  daily for 3 days then 10mg  daily for 3 days. May take Tramadol 50mg  every 12 hours as needed for severe pain. Follow-up with a Rheumatologist for further evaluation and management.

## 2016-09-25 NOTE — ED Provider Notes (Signed)
CSN: [id number]     Arrival date & time 09/24/16  1908 History   First MD Initiated Contact with Patient 09/24/16 2042     Chief Complaint  Patient presents with  . Hand Problem   (Consider location/radiation/quality/duration/timing/severity/associated sxs/prior Treatment) 36 year old female presents with left hand and wrist pain that has become more severe today. She has a long-standing history of rheumatoid arthritis and takes Methotrexate once weekly. No distinct injury or trigger to current pain and symptoms. She is wearing a wrist and hand splint for comfort and has taken Voltaren, Tylenol arthritis, Ibuprofen 800mg  and 3 Methotrexate today with no relief. She is experiencing slight numbness in her 4th and 5th fingers due to swelling and pain in her wrist but otherwise no radiation of pain. She has taken Prednisone and Tramadol in the past with good success.    The history is provided by the patient.    Past Medical History:  Diagnosis Date  . Arthritis    Past Surgical History:  Procedure Laterality Date  . CESAREAN SECTION    . CESAREAN SECTION     No family history on file. Social History  Substance Use Topics  . Smoking status: Current Some Day Smoker    Types: Cigarettes  . Smokeless tobacco: Current User  . Alcohol use No   OB History    No data available     Review of Systems  Constitutional: Positive for fatigue. Negative for chills and fever.  Respiratory: Negative for cough, chest tightness, shortness of breath and wheezing.   Cardiovascular: Negative for chest pain.  Gastrointestinal: Negative for abdominal pain, nausea and vomiting.  Musculoskeletal: Positive for arthralgias, joint swelling and myalgias. Negative for back pain and neck pain.  Skin: Negative for color change, rash and wound.  Allergic/Immunologic: Positive for immunocompromised state.  Neurological: Positive for numbness. Negative for dizziness, tremors, seizures, syncope, speech difficulty,  weakness and headaches.  Hematological: Negative for adenopathy.    Allergies  Red dye; Meloxicam; Penicillins; and Morphine and related  Home Medications   Prior to Admission medications   Medication Sig Start Date End Date Taking? Authorizing Provider  acetaminophen (TYLENOL) 650 MG CR tablet Take 1,300 mg by mouth daily as needed for pain (and headache).     Historical Provider, MD  methotrexate (RHEUMATREX) 2.5 MG tablet Take 15 mg by mouth every 7 (seven) days. SUNDAY 05/23/16   Historical Provider, MD  ondansetron (ZOFRAN) 4 MG tablet Take 1 tablet (4 mg total) by mouth every 8 (eight) hours as needed for nausea or vomiting. 06/29/16   07/01/16, PA-C  predniSONE (DELTASONE) 10 MG tablet Take 4 tablets by mouth daily for 3 days then 3 tablets daily for 3 days then 2 tablets daily for 3 days and then 1 tablet for last 3 days until finished. 09/24/16   09/26/16, NP  traMADol (ULTRAM) 50 MG tablet Take 1 tablet (50 mg total) by mouth every 12 (twelve) hours as needed for severe pain. 09/24/16   09/26/16, NP   Meds Ordered and Administered this Visit   Medications  methylPREDNISolone sodium succinate (SOLU-MEDROL) 125 mg/2 mL injection 125 mg (125 mg Intramuscular Given 09/24/16 2119)    BP 117/71 (BP Location: Right Arm)   Pulse (!) 104   Temp 98.8 F (37.1 C) (Oral)   Resp 17   Ht 4\' 11"  (1.499 m)   Wt 201 lb (91.2 kg)   SpO2 98%   BMI 40.60 kg/m  No data found.   Physical Exam  Constitutional: She is oriented to person, place, and time. She appears well-developed and well-nourished. No distress.  HENT:  Head: Normocephalic and atraumatic.  Eyes: Conjunctivae are normal.  Neck: Normal range of motion. Neck supple.  Cardiovascular: Regular rhythm.  Tachycardia present.   Pulmonary/Chest: Effort normal and breath sounds normal. No respiratory distress.  Musculoskeletal: She exhibits edema and tenderness.       Left hand: She exhibits tenderness and  swelling. She exhibits normal range of motion, normal capillary refill and no deformity. Decreased sensation noted. Decreased sensation is present in the ulnar distribution. Normal strength noted.       Hands: Has full range of motion but with pain with flexion of hand and fingers. Tender along ulnar nerve path. Slight swelling noted in wrist and left hand. Decreased sensation in left 4th and 5th digits distally. Good pulses and capillary refill.   Neurological: She is alert and oriented to person, place, and time. She has normal strength. A sensory deficit is present.  Skin: Skin is warm, dry and intact. Capillary refill takes less than 2 seconds.  Psychiatric: She has a normal mood and affect. Her behavior is normal. Judgment and thought content normal.    Urgent Care Course     Procedures (including critical care time)  Labs Review Labs Reviewed - No data to display  Imaging Review No results found.   Visual Acuity Review  Right Eye Distance:   Left Eye Distance:   Bilateral Distance:    Right Eye Near:   Left Eye Near:    Bilateral Near:         MDM   1. Left hand pain   2. Rheumatoid arthritis involving left wrist, unspecified rheumatoid factor presence (HCC)    Discussed treatment options- gave Solu Medrol 125mg  IM today to help with pain and inflammation. May start Prednisone 40mg  tomorrow- take for 3 days then decrease to 30mg  for 3 days and continue to taper by 10mg  every 3 days until finished. Use Tramadol 50mg  every 12 hours as needed for severe pain. Reviewed [place] Controlled Substance Registry- only active Rx was for Phentermine in the past 3 months. Continue to wear wrist and hand splint as needed. Recommend follow-up with her Rheumatologist as soon as possible for further evaluation and management or go to ER if pain does not improve.     , NP 09/25/16 [phone]

## 2016-12-14 ENCOUNTER — Encounter (HOSPITAL_COMMUNITY): Payer: Self-pay | Admitting: Emergency Medicine

## 2016-12-14 ENCOUNTER — Emergency Department (HOSPITAL_COMMUNITY)
Admission: EM | Admit: 2016-12-14 | Discharge: 2016-12-14 | Disposition: A | Discharge status: Home / Self Care | Payer: BLUE CROSS/BLUE SHIELD | Attending: Physician Assistant | Admitting: Physician Assistant

## 2016-12-14 DIAGNOSIS — Z79899 Other long term (current) drug therapy: Secondary | ICD-10-CM | POA: Insufficient documentation

## 2016-12-14 DIAGNOSIS — M069 Rheumatoid arthritis, unspecified: Secondary | ICD-10-CM

## 2016-12-14 DIAGNOSIS — M25531 Pain in right wrist: Secondary | ICD-10-CM | POA: Diagnosis present

## 2016-12-14 DIAGNOSIS — F17210 Nicotine dependence, cigarettes, uncomplicated: Secondary | ICD-10-CM | POA: Insufficient documentation

## 2016-12-14 MED ORDER — PREDNISONE 20 MG PO TABS: ORAL_TABLET | ORAL | 0 refills | Status: DC

## 2016-12-14 MED ORDER — IBUPROFEN 800 MG PO TABS: 800.0000 mg | ORAL_TABLET | Freq: Three times a day (TID) | ORAL | 0 refills | Status: DC

## 2016-12-14 MED ORDER — PREDNISONE 10 MG PO TABS: 10.0000 mg | ORAL_TABLET | Freq: Every day | ORAL | 0 refills | Status: AC

## 2016-12-14 MED ORDER — IBUPROFEN 800 MG PO TABS
800.0000 mg | ORAL_TABLET | Freq: Once | ORAL | Status: AC
  Administered 2016-12-14: 800 mg via ORAL
  Filled 2016-12-14: qty 1

## 2016-12-14 MED ORDER — PREDNISONE 20 MG PO TABS
60.0000 mg | ORAL_TABLET | Freq: Once | ORAL | Status: AC
  Administered 2016-12-14: 60 mg via ORAL
  Filled 2016-12-14: qty 3

## 2016-12-14 NOTE — ED Notes (Signed)
Pt requesting pain meds. States she is "having severe pain". MD notified.

## 2016-12-14 NOTE — ED Notes (Signed)
Pt with hx of "arthritis" . C/o right wrist pain. States she is out of her Prednisone and Methotrexate x 1 week. States she is "between Rheumatologists". Was going to [facility] Rheumatology.

## 2016-12-14 NOTE — Discharge Instructions (Signed)
Please follow up with your rheumatoid arthritis specialist.

## 2016-12-14 NOTE — ED Provider Notes (Signed)
[facility] Provider Note   CSN: [id number] Arrival date & time: 12/14/16  1051  By signing my name below, I, Thelma Barge, attest that this documentation has been prepared under the direction and in the presence of Shiane Wenberg Lyn, *. Electronically Signed: Thelma Barge, Scribe. 12/14/16. 12:34 PM.  History   Chief Complaint Chief Complaint  Patient presents with  . Wrist Pain    The patient said she started having pain in her neck, right wrist and left toe.  She rates her pain 10/10.  She tried to take ibuprofen for the pain but it is not helping.     The history is provided by the patient. No language interpreter was used.    HPI Comments: Catherine Underwood is a 36 y.o. female with a PMHx of RA who presents to the Emergency Department complaining of constant, gradually worsening right-sided wrist pain. When her arthritis flairs up, she usually takes methotrexate and 10 mg steroids. She sees Dr. Janyth Contes and is switching providers to Janett Labella at [facility] with her last visit being about 5 months ago.  Past Medical History:  Diagnosis Date  . Arthritis     Patient Active Problem List   Diagnosis Date Noted  . Preventative health care 04/19/2011    Past Surgical History:  Procedure Laterality Date  . CESAREAN SECTION    . CESAREAN SECTION      OB History    No data available       Home Medications    Prior to Admission medications   Medication Sig Start Date End Date Taking? Authorizing Provider  acetaminophen (TYLENOL) 650 MG CR tablet Take 1,300 mg by mouth daily as needed for pain (and headache).     [provider]  methotrexate (RHEUMATREX) 2.5 MG tablet Take 15 mg by mouth every 7 (seven) days. SUNDAY 05/23/16   [provider]  ondansetron (ZOFRAN) 4 MG tablet Take 1 tablet (4 mg total) by mouth every 8 (eight) hours as needed for nausea or vomiting. 06/29/16   Mathews Robinsons B, PA-C  predniSONE  (DELTASONE) 10 MG tablet Take 4 tablets by mouth daily for 3 days then 3 tablets daily for 3 days then 2 tablets daily for 3 days and then 1 tablet for last 3 days until finished. 09/24/16   Sudie Grumbling, NP  traMADol (ULTRAM) 50 MG tablet Take 1 tablet (50 mg total) by mouth every 12 (twelve) hours as needed for severe pain. 09/24/16   Sudie Grumbling, NP    Family History History reviewed. No pertinent family history.  Social History Social History  Substance Use Topics  . Smoking status: Current Some Day Smoker    Types: Cigarettes  . Smokeless tobacco: Current User  . Alcohol use No     Allergies   Red dye; Meloxicam; Penicillins; and Morphine and related   Review of Systems Review of Systems  Musculoskeletal: Positive for arthralgias.  All other systems reviewed and are negative.    Physical Exam Updated Vital Signs BP 113/76 (BP Location: Left Arm)   Pulse 86   Temp 98.4 F (36.9 C) (Oral)   Resp 16   Ht 4\' 11"  (1.499 m)   Wt 190 lb (86.2 kg)   LMP 11/30/2016   SpO2 100%   BMI 38.38 kg/m   Physical Exam  Constitutional: She is oriented to person, place, and time. She appears well-developed and well-nourished.  HENT:  Head: Normocephalic.  Eyes: EOM are normal.  Neck: Normal range of motion.  Cardiovascular: Normal rate, regular rhythm and normal heart sounds.   Reg rate/rhythm  Pulmonary/Chest: Effort normal and breath sounds normal. No respiratory distress. She has no wheezes. She has no rales.  clear to auscultation  Abdominal: She exhibits no distension.  Musculoskeletal: Normal range of motion.  No warmth, no redness Pain with movement of right wrist No erythema and no warmth  Neurological: She is alert and oriented to person, place, and time.  Psychiatric: She has a normal mood and affect.  Nursing note and vitals reviewed.    ED Treatments / Results  DIAGNOSTIC STUDIES: Oxygen Saturation is 100% on RA, normal by my interpretation.     COORDINATION OF CARE: 12:18 PM Discussed treatment plan with pt at bedside and pt agreed to plan.  Labs (all labs ordered are listed, but only abnormal results are displayed) Labs Reviewed - No data to display  EKG  EKG Interpretation None       Radiology No results found.  Procedures Procedures (including critical care time)  Medications Ordered in ED Medications - No data to display   Initial Impression / Assessment and Plan / ED Course  I have reviewed the triage vital signs and the nursing notes.  Pertinent labs & imaging results that were available during my care of the patient were reviewed by me and considered in my medical decision making (see chart for details).    I personally performed the services described in this documentation, which was scribed in my presence. The recorded information has been reviewed and is accurate.   Patient is very pleasant 36 year old female who has a history of rheumatoid arthritis. She is requesting prednisone for her rheumatoid flare. Patient goes to [facility] rheumatology normally. She's between rheumatologist at the moment. We will give her prednisone taper and prescription strength ibuprofen. Patient's wrist shows no signs of infection.     Final Clinical Impressions(s) / ED Diagnoses   Final diagnoses:  None    New Prescriptions New Prescriptions   No medications on file     Abelino Derrick, MD 12/14/16 1241

## 2016-12-14 NOTE — ED Triage Notes (Signed)
The patient said she started having pain in her neck, right wrist and left toe.  She rates her pain 10/10.  She tried to take ibuprofen for the pain but it is not helping. She rates her pain 10/10.

## 2017-01-17 ENCOUNTER — Encounter (HOSPITAL_COMMUNITY): Payer: Self-pay | Admitting: Family Medicine

## 2017-01-17 ENCOUNTER — Ambulatory Visit (HOSPITAL_COMMUNITY)
Admission: EM | Admit: 2017-01-17 | Discharge: 2017-01-17 | Disposition: A | Discharge status: Home / Self Care | Payer: BLUE CROSS/BLUE SHIELD | Attending: Family Medicine | Admitting: Family Medicine

## 2017-01-17 DIAGNOSIS — Z8739 Personal history of other diseases of the musculoskeletal system and connective tissue: Secondary | ICD-10-CM

## 2017-01-17 DIAGNOSIS — M25552 Pain in left hip: Secondary | ICD-10-CM

## 2017-01-17 MED ORDER — IBUPROFEN 800 MG PO TABS: 800.0000 mg | ORAL_TABLET | Freq: Three times a day (TID) | ORAL | 0 refills | Status: DC

## 2017-01-17 MED ORDER — METHYLPREDNISOLONE ACETATE 80 MG/ML IJ SUSP
80.0000 mg | Freq: Once | INTRAMUSCULAR | Status: AC
  Administered 2017-01-17: 80 mg via INTRAMUSCULAR

## 2017-01-17 MED ORDER — PREDNISONE 20 MG PO TABS: ORAL_TABLET | ORAL | 0 refills | Status: DC

## 2017-01-17 MED ORDER — METHYLPREDNISOLONE ACETATE 80 MG/ML IJ SUSP
INTRAMUSCULAR | Status: AC
  Filled 2017-01-17: qty 1

## 2017-01-17 NOTE — Discharge Instructions (Signed)
You need to follow-up with your rheumatology specialist.

## 2017-01-17 NOTE — ED Triage Notes (Signed)
Pt here for RA flare up specifically in hip. sts took prednisone last night.

## 2017-01-17 NOTE — ED Provider Notes (Signed)
[facility]    CSN: [id number] Arrival date & time: 01/17/17  1434     History   Chief Complaint Chief Complaint  Patient presents with  . Rheumatoid Arthritis    HPI Catherine Underwood is a 36 y.o. female.   This is an obese 36 year old woman who presents for evaluation of joint pain she considers to be secondary to rheumatoid arthritis.  She complains about left hip pain which began yesterday. She's taking 10 mg of prednisone but it has not helped.  Patient states that she works at [employer]. She says she's in between rheumatology specialists at this time.      Past Medical History:  Diagnosis Date  . Arthritis     Patient Active Problem List   Diagnosis Date Noted  . Preventative health care 04/19/2011    Past Surgical History:  Procedure Laterality Date  . CESAREAN SECTION    . CESAREAN SECTION      OB History    No data available       Home Medications    Prior to Admission medications   Medication Sig Start Date End Date Taking? Authorizing Provider  acetaminophen (TYLENOL) 650 MG CR tablet Take 1,300 mg by mouth daily as needed for pain (and headache).     [provider]  ibuprofen (ADVIL,MOTRIN) 800 MG tablet Take 1 tablet (800 mg total) by mouth 3 (three) times daily. 01/17/17   Elvina Sidle, MD  methotrexate (RHEUMATREX) 2.5 MG tablet Take 15 mg by mouth every 7 (seven) days. SUNDAY 05/23/16   [provider]  predniSONE (DELTASONE) 20 MG tablet Two daily with food 01/17/17   Elvina Sidle, MD  traMADol (ULTRAM) 50 MG tablet Take 1 tablet (50 mg total) by mouth every 12 (twelve) hours as needed for severe pain. 09/24/16   Sudie Grumbling, NP    Family History History reviewed. No pertinent family history.  Social History Social History  Substance Use Topics  . Smoking status: Current Some Day Smoker    Types: Cigarettes  . Smokeless tobacco: Current User  . Alcohol use No     Allergies   Red dye;  Meloxicam; Penicillins; and Morphine and related   Review of Systems Review of Systems  Musculoskeletal: Positive for gait problem and myalgias.  All other systems reviewed and are negative.    Physical Exam Triage Vital Signs ED Triage Vitals  Enc Vitals Group     BP 01/17/17 1512 111/74     Pulse Rate 01/17/17 1512 98     Resp 01/17/17 1512 18     Temp 01/17/17 1512 98.6 F (37 C)     Temp src --      SpO2 01/17/17 1512 100 %     Weight --      Height --      Head Circumference --      Peak Flow --      Pain Score 01/17/17 1513 8     Pain Loc --      Pain Edu? --      Excl. in GC? --    No data found.   Updated Vital Signs BP 111/74   Pulse 98   Temp 98.6 F (37 C)   Resp 18   SpO2 100%    Physical Exam  Constitutional: She is oriented to person, place, and time. She appears well-developed and well-nourished.  HENT:  Right Ear: External ear normal.  Left Ear: External ear normal.  Eyes: Pupils are equal, round, and reactive to light. Conjunctivae are normal.  Neck: Normal range of motion. Neck supple.  Pulmonary/Chest: Effort normal.  Musculoskeletal: She exhibits no tenderness.  Patient has pain and limited range of motion in the left hip with internal and external rotation.  There is no deformity of her finger joints or elbows.  Neurological: She is alert and oriented to person, place, and time.  Skin: Skin is warm and dry.  Nursing note and vitals reviewed.    UC Treatments / Results  Labs (all labs ordered are listed, but only abnormal results are displayed) Labs Reviewed - No data to display  EKG  EKG Interpretation None       Radiology No results found.  Procedures Procedures (including critical care time)  Medications Ordered in UC Medications  methylPREDNISolone acetate (DEPO-MEDROL) injection 80 mg (not administered)     Initial Impression / Assessment and Plan / UC Course  I have reviewed the triage vital signs and the  nursing notes.  Pertinent labs & imaging results that were available during my care of the patient were reviewed by me and considered in my medical decision making (see chart for details).     Final Clinical Impressions(s) / UC Diagnoses   Final diagnoses:  Left hip pain  H/O rheumatoid arthritis    New Prescriptions New Prescriptions   IBUPROFEN (ADVIL,MOTRIN) 800 MG TABLET    Take 1 tablet (800 mg total) by mouth 3 (three) times daily.   PREDNISONE (DELTASONE) 20 MG TABLET    Two daily with food     Controlled Substance Prescriptions [place] Controlled Substance Registry consulted? Not Applicable   Elvina Sidle, MD 01/17/17 1534

## 2017-01-18 ENCOUNTER — Emergency Department (HOSPITAL_COMMUNITY)
Admission: EM | Admit: 2017-01-18 | Discharge: 2017-01-18 | Disposition: A | Discharge status: Home / Self Care | Payer: BLUE CROSS/BLUE SHIELD | Attending: Emergency Medicine | Admitting: Emergency Medicine

## 2017-01-18 ENCOUNTER — Encounter (HOSPITAL_COMMUNITY): Payer: Self-pay | Admitting: Emergency Medicine

## 2017-01-18 DIAGNOSIS — M069 Rheumatoid arthritis, unspecified: Secondary | ICD-10-CM | POA: Insufficient documentation

## 2017-01-18 DIAGNOSIS — M25552 Pain in left hip: Secondary | ICD-10-CM

## 2017-01-18 DIAGNOSIS — Z8739 Personal history of other diseases of the musculoskeletal system and connective tissue: Secondary | ICD-10-CM

## 2017-01-18 DIAGNOSIS — Z79899 Other long term (current) drug therapy: Secondary | ICD-10-CM | POA: Diagnosis not present

## 2017-01-18 MED ORDER — TRAMADOL HCL 50 MG PO TABS: 50.0000 mg | ORAL_TABLET | Freq: Three times a day (TID) | ORAL | 0 refills | Status: DC

## 2017-01-18 MED ORDER — IBUPROFEN 800 MG PO TABS
800.0000 mg | ORAL_TABLET | Freq: Once | ORAL | Status: AC
  Administered 2017-01-18: 800 mg via ORAL
  Filled 2017-01-18: qty 1

## 2017-01-18 MED ORDER — TRAMADOL HCL 50 MG PO TABS
50.0000 mg | ORAL_TABLET | Freq: Once | ORAL | Status: AC
  Administered 2017-01-18: 50 mg via ORAL
  Filled 2017-01-18: qty 1

## 2017-01-18 NOTE — ED Triage Notes (Signed)
Pt c/o rheumatoid arthritis flare. States she was seen at urgent care for same. States she was given a steroid shot and was unable to fill prescriptions.

## 2017-01-18 NOTE — ED Notes (Signed)
Pt has requested to stay in the wheelchair and not get in the bed

## 2017-01-18 NOTE — Discharge Instructions (Signed)
Call to make an appointment with your rheumatologist for this week if possible. Take medications as prescribed. Return here with any worsening symptoms.

## 2017-01-18 NOTE — ED Provider Notes (Signed)
[facility]   CSN: [id number] Arrival date & time: 01/18/17  0112     History   Chief Complaint Chief Complaint  Patient presents with  . Hip Pain    HPI Catherine Underwood is a 36 y.o. female.  Patient presents with left hip pain she feels related to her rheumatoid arthritis. No swelling, redness or fever. No other joint pain. She was seen at Urgent Care and given prednisone injection that has not helped. No lower back pain, numbness or weakness.    The history is provided by the patient. No language interpreter was used.    Past Medical History:  Diagnosis Date  . Arthritis     Patient Active Problem List   Diagnosis Date Noted  . Preventative health care 04/19/2011    Past Surgical History:  Procedure Laterality Date  . CESAREAN SECTION    . CESAREAN SECTION      OB History    No data available       Home Medications    Prior to Admission medications   Medication Sig Start Date End Date Taking? Authorizing Provider  acetaminophen (TYLENOL) 650 MG CR tablet Take 1,300 mg by mouth daily as needed for pain (and headache).     [provider]  ibuprofen (ADVIL,MOTRIN) 800 MG tablet Take 1 tablet (800 mg total) by mouth 3 (three) times daily. 01/17/17   Elvina Sidle, MD  methotrexate (RHEUMATREX) 2.5 MG tablet Take 15 mg by mouth every 7 (seven) days. SUNDAY 05/23/16   [provider]  predniSONE (DELTASONE) 20 MG tablet Two daily with food 01/17/17   Elvina Sidle, MD  traMADol (ULTRAM) 50 MG tablet Take 1 tablet (50 mg total) by mouth every 12 (twelve) hours as needed for severe pain. 09/24/16   Sudie Grumbling, NP    Family History No family history on file.  Social History Social History  Substance Use Topics  . Smoking status: Current Some Day Smoker    Types: Cigarettes  . Smokeless tobacco: Current User  . Alcohol use No     Allergies   Red dye; Meloxicam; Penicillins; and Morphine and  related   Review of Systems Review of Systems  Constitutional: Negative for chills and fever.  Gastrointestinal: Negative.  Negative for abdominal pain and nausea.  Musculoskeletal:       See HPI.  Skin: Negative.   Neurological: Negative.      Physical Exam Updated Vital Signs BP 103/60   Pulse 86   Temp 98.2 F (36.8 C) (Oral)   Resp 18   Ht 4\' 11"  (1.499 m)   Wt 86.2 kg (190 lb)   LMP 12/29/2016   SpO2 100%   BMI 38.38 kg/m   Physical Exam  Constitutional: She is oriented to person, place, and time. She appears well-developed and well-nourished.  Neck: Normal range of motion.  Cardiovascular: Intact distal pulses.   Pulmonary/Chest: Effort normal.  Abdominal: Soft. She exhibits no distension. There is no tenderness.  Musculoskeletal: Normal range of motion. She exhibits no edema.  Left posterior hip tenderness. No joint swelling although exam limited by body habitus. FROM with pain on external rotation of the hip joint. No thigh or calf tenderness.   Neurological: She is alert and oriented to person, place, and time.  Skin: Skin is warm and dry. No erythema.     ED Treatments / Results  Labs (all labs ordered are listed, but only abnormal results are displayed) Labs Reviewed -  No data to display  EKG  EKG Interpretation None       Radiology No results found.  Procedures Procedures (including critical care time)  Medications Ordered in ED Medications  ibuprofen (ADVIL,MOTRIN) tablet 800 mg (not administered)  traMADol (ULTRAM) tablet 50 mg (not administered)     Initial Impression / Assessment and Plan / ED Course  I have reviewed the triage vital signs and the nursing notes.  Pertinent labs & imaging results that were available during my care of the patient were reviewed by me and considered in my medical decision making (see chart for details).     Patient presents with persistent left hip pain thought related to her arthritic condition.  She is in NAD, VSS. Will treat her pain with Tramadol, which she states worked in the past and ibuprofen. Encouraged her to make an appointment with her rheumatologist.  Final Clinical Impressions(s) / ED Diagnoses   Final diagnoses:  None   1. Left hip pain 2. History of rheumatoid arthritis  New Prescriptions New Prescriptions   No medications on file     Danne Harbor 01/18/17 0612    Dione Booze, MD 01/18/17 [phone]

## 2017-02-18 ENCOUNTER — Ambulatory Visit (HOSPITAL_COMMUNITY)
Admission: EM | Admit: 2017-02-18 | Discharge: 2017-02-18 | Disposition: A | Discharge status: Home / Self Care | Payer: BLUE CROSS/BLUE SHIELD | Attending: Family Medicine | Admitting: Family Medicine

## 2017-02-18 ENCOUNTER — Encounter (HOSPITAL_COMMUNITY): Payer: Self-pay | Admitting: Family Medicine

## 2017-02-18 DIAGNOSIS — M0520 Rheumatoid vasculitis with rheumatoid arthritis of unspecified site: Secondary | ICD-10-CM

## 2017-02-18 DIAGNOSIS — I00 Rheumatic fever without heart involvement: Secondary | ICD-10-CM

## 2017-02-18 MED ORDER — IBUPROFEN 800 MG PO TABS: 800.0000 mg | ORAL_TABLET | Freq: Three times a day (TID) | ORAL | 0 refills | Status: DC

## 2017-02-18 MED ORDER — PREDNISONE 10 MG (21) PO TBPK: ORAL_TABLET | Freq: Every day | ORAL | 0 refills | Status: DC

## 2017-02-18 NOTE — ED Triage Notes (Signed)
Pt presents with c/o hand and neck pain. The pain began yesterday. She has a history of rheumatoid arthritis and she describes the pain as an RA flare. She tried ibuprofen, aleve with no relief. She currently does not have a PCP and has been off her methotrexate for several months.

## 2017-02-19 NOTE — ED Provider Notes (Signed)
  [facility]   [id number] 02/18/17 Arrival Time: 1908  ASSESSMENT & PLAN:  1. Rheumatoid arteritis     Meds ordered this encounter  Medications  . predniSONE (STERAPRED UNI-PAK 21 TAB) 10 MG (21) TBPK tablet    Sig: Take by mouth daily. Take as directed.    Dispense:  21 tablet    Refill:  0  . ibuprofen (ADVIL,MOTRIN) 800 MG tablet    Sig: Take 1 tablet (800 mg total) by mouth 3 (three) times daily.    Dispense:  21 tablet    Refill:  0   Will f/u as needed. Reviewed expectations re: course of current medical issues. Questions answered. Outlined signs and symptoms indicating need for more acute intervention. Patient verbalized understanding. After Visit Summary given.   SUBJECTIVE:  Catherine Underwood is a 36 y.o. female who presents with complaint of rheumatoid arthritis exacerbation. Gradual onset 1-2 days ago. Mild swelling over R hand, specifically 3/4 fingers. Ibuprofen without much help. Has seen rheumatologist in the past for methotrexate treatment. Movement makes symptoms worse. Rest improves. No injury.  ROS: As per HPI.   OBJECTIVE:  Vitals:   02/18/17 1958  BP: 137/82  Pulse: 96  Resp: 16  Temp: 98.1 F (36.7 C)  TempSrc: Oral  SpO2: 100%    General appearance: alert; no distress Extremities: R hand with tenderness over 3/4 MTP and PIP; FROM; mild swelling Skin: warm and dry Neurologic: normal gait; normal symmetric reflexes Psychological: alert and cooperative; normal mood and affect   Allergies  Allergen Reactions  . Red Dye Swelling    Throat swells  . Meloxicam Hives and Itching  . Penicillins Other (See Comments)    Yeast infection  . Morphine And Related Hives    Past Medical History:  Diagnosis Date  . Arthritis    Social History   Social History  . Marital status: Single    Spouse name: N/A  . Number of children: N/A  . Years of education: N/A   Occupational History  . Not on file.   Social History Main  Topics  . Smoking status: Current Some Day Smoker    Types: Cigarettes  . Smokeless tobacco: Former Neurosurgeon  . Alcohol use No  . Drug use: No  . Sexual activity: Yes    Birth control/ protection: None   Other Topics Concern  . Not on file   Social History Narrative  . No narrative on file   History reviewed. No pertinent family history. Past Surgical History:  Procedure Laterality Date  . CESAREAN SECTION    . CESAREAN SECTION       Mardella Layman, MD 02/19/17 [phone]

## 2017-08-12 ENCOUNTER — Ambulatory Visit (HOSPITAL_COMMUNITY)
Admission: EM | Admit: 2017-08-12 | Discharge: 2017-08-12 | Disposition: A | Discharge status: Home / Self Care | Payer: BLUE CROSS/BLUE SHIELD | Attending: Family Medicine | Admitting: Family Medicine

## 2017-08-12 ENCOUNTER — Encounter (HOSPITAL_COMMUNITY): Payer: Self-pay | Admitting: Emergency Medicine

## 2017-08-12 DIAGNOSIS — R52 Pain, unspecified: Secondary | ICD-10-CM

## 2017-08-12 DIAGNOSIS — R05 Cough: Secondary | ICD-10-CM

## 2017-08-12 DIAGNOSIS — R5383 Other fatigue: Secondary | ICD-10-CM | POA: Diagnosis not present

## 2017-08-12 DIAGNOSIS — R0982 Postnasal drip: Secondary | ICD-10-CM

## 2017-08-12 DIAGNOSIS — R0981 Nasal congestion: Secondary | ICD-10-CM | POA: Diagnosis not present

## 2017-08-12 DIAGNOSIS — R509 Fever, unspecified: Secondary | ICD-10-CM

## 2017-08-12 DIAGNOSIS — J111 Influenza due to unidentified influenza virus with other respiratory manifestations: Secondary | ICD-10-CM

## 2017-08-12 DIAGNOSIS — R69 Illness, unspecified: Principal | ICD-10-CM

## 2017-08-12 MED ORDER — BENZONATATE 100 MG PO CAPS: 100.0000 mg | ORAL_CAPSULE | Freq: Three times a day (TID) | ORAL | 0 refills | Status: DC

## 2017-08-12 NOTE — Discharge Instructions (Signed)

## 2017-08-12 NOTE — ED Triage Notes (Signed)
PT reports cough, headache, congestion, fever that started yesterday.

## 2017-08-12 NOTE — ED Provider Notes (Signed)
  [facility]   [id number] 08/12/17 Arrival Time: 1007  ASSESSMENT & PLAN:  1. Influenza-like illness    Meds ordered this encounter  Medications  . benzonatate (TESSALON) 100 MG capsule    Sig: Take 1 capsule (100 mg total) by mouth every 8 (eight) hours.    Dispense:  21 capsule    Refill:  0   No Tamiflu (she is allergic to red dye).  Work note given.. Discussed typical duration of symptoms. OTC symptom care as needed. Ensure adequate fluid intake and rest. May f/u with PCP or here as needed.  Reviewed expectations re: course of current medical issues. Questions answered. Outlined signs and symptoms indicating need for more acute intervention. Patient verbalized understanding. After Visit Summary given.   SUBJECTIVE: History from: patient.  Catherine Underwood is a 37 y.o. female who presents with complaint of nasal congestion, post-nasal drainage, and a persistent dry cough. Onset abrupt, approximately 1 day ago. Overall fatigued with body aches. SOB: none. Wheezing: none. Fever: yes, subjective with chills. Overall normal PO intake without n/v. Sick contacts: yes, co-workers. OTC treatment: Tylenol with some help.  Received flu shot this year: no.  Social History   Tobacco Use  Smoking Status Current Some Day Smoker  . Types: Cigarettes  Smokeless Tobacco Former User    ROS: As per HPI.   OBJECTIVE:  Vitals:   08/12/17 1100 08/12/17 1101  Pulse: (!) 114   Resp: 16   Temp: 100 F (37.8 C)   TempSrc: Oral   SpO2: 100%   Weight:  180 lb (81.6 kg)     General appearance: alert; appears fatigued HEENT: nasal congestion; clear runny nose; throat irritation secondary to post-nasal drainage Neck: supple without LAD Lungs: unlabored respirations, symmetrical air entry; cough: moderate; no respiratory distress Skin: warm and dry Psychological: alert and cooperative; normal mood and affect  Imaging: No results found.  Allergies  Allergen  Reactions  . Red Dye Swelling    Throat swells  . Meloxicam Hives and Itching  . Penicillins Other (See Comments)    Yeast infection  . Morphine And Related Hives    Past Medical History:  Diagnosis Date  . Arthritis    No family history on file. Social History   Socioeconomic History  . Marital status: Single    Spouse name: Not on file  . Number of children: Not on file  . Years of education: Not on file  . Highest education level: Not on file  Social Needs  . Financial resource strain: Not on file  . Food insecurity - worry: Not on file  . Food insecurity - inability: Not on file  . Transportation needs - medical: Not on file  . Transportation needs - non-medical: Not on file  Occupational History  . Not on file  Tobacco Use  . Smoking status: Current Some Day Smoker    Types: Cigarettes  . Smokeless tobacco: Former Engineer, water and Sexual Activity  . Alcohol use: No  . Drug use: No  . Sexual activity: Yes    Birth control/protection: None  Other Topics Concern  . Not on file  Social History Narrative  . Not on file           Mardella Layman, MD 08/12/17 1120

## 2017-08-18 ENCOUNTER — Inpatient Hospital Stay (HOSPITAL_COMMUNITY)
Admission: AD | Admit: 2017-08-18 | Discharge: 2017-08-18 | Disposition: A | Discharge status: Home / Self Care | Payer: BLUE CROSS/BLUE SHIELD | Source: Ambulatory Visit | Attending: Obstetrics & Gynecology | Admitting: Obstetrics & Gynecology

## 2017-08-18 ENCOUNTER — Other Ambulatory Visit: Payer: Self-pay

## 2017-08-18 ENCOUNTER — Encounter (HOSPITAL_COMMUNITY): Payer: Self-pay

## 2017-08-18 ENCOUNTER — Inpatient Hospital Stay (HOSPITAL_COMMUNITY): Payer: BLUE CROSS/BLUE SHIELD

## 2017-08-18 DIAGNOSIS — B373 Candidiasis of vulva and vagina: Secondary | ICD-10-CM | POA: Diagnosis not present

## 2017-08-18 DIAGNOSIS — Z3A01 Less than 8 weeks gestation of pregnancy: Secondary | ICD-10-CM | POA: Diagnosis not present

## 2017-08-18 DIAGNOSIS — O209 Hemorrhage in early pregnancy, unspecified: Secondary | ICD-10-CM | POA: Diagnosis not present

## 2017-08-18 DIAGNOSIS — O3680X0 Pregnancy with inconclusive fetal viability, not applicable or unspecified: Secondary | ICD-10-CM

## 2017-08-18 DIAGNOSIS — Z6790 Unspecified blood type, Rh positive: Secondary | ICD-10-CM | POA: Diagnosis not present

## 2017-08-18 DIAGNOSIS — B3731 Acute candidiasis of vulva and vagina: Secondary | ICD-10-CM

## 2017-08-18 DIAGNOSIS — O98811 Other maternal infectious and parasitic diseases complicating pregnancy, first trimester: Secondary | ICD-10-CM | POA: Diagnosis not present

## 2017-08-18 LAB — URINALYSIS, ROUTINE W REFLEX MICROSCOPIC
BILIRUBIN URINE: NEGATIVE
GLUCOSE, UA: NEGATIVE mg/dL
HGB URINE DIPSTICK: NEGATIVE
KETONES UR: NEGATIVE mg/dL
Nitrite: POSITIVE — AB
PH: 6 (ref 5.0–8.0)
PROTEIN: NEGATIVE mg/dL
Specific Gravity, Urine: 1.02 (ref 1.005–1.030)

## 2017-08-18 LAB — ABO/RH: ABO/RH(D): O POS

## 2017-08-18 LAB — WET PREP, GENITAL
Sperm: NONE SEEN
Trich, Wet Prep: NONE SEEN

## 2017-08-18 LAB — CBC
HCT: 43.4 % (ref 36.0–46.0)
Hemoglobin: 14 g/dL (ref 12.0–15.0)
MCH: 25.5 pg — AB (ref 26.0–34.0)
MCHC: 32.3 g/dL (ref 30.0–36.0)
MCV: 79.2 fL (ref 78.0–100.0)
PLATELETS: 185 10*3/uL (ref 150–400)
RBC: 5.48 MIL/uL — ABNORMAL HIGH (ref 3.87–5.11)
RDW: 12.9 % (ref 11.5–15.5)
WBC: 3.4 10*3/uL — ABNORMAL LOW (ref 4.0–10.5)

## 2017-08-18 LAB — HCG, QUANTITATIVE, PREGNANCY: HCG, BETA CHAIN, QUANT, S: 4067 m[IU]/mL — AB (ref <5)

## 2017-08-18 LAB — POCT PREGNANCY, URINE: Preg Test, Ur: POSITIVE — AB

## 2017-08-18 MED ORDER — TERCONAZOLE 0.4 % VA CREA: 1.0000 | TOPICAL_CREAM | Freq: Every day | VAGINAL | 0 refills | Status: DC

## 2017-08-18 NOTE — MAU Note (Signed)
Pt states she took a home pregnancy test 3 days ago and it was positive. She states she is now having vaginal bleeding when she wipes that is dark red.  Pt states she has hx of an ectopic pregnancy and is worried about it.   Pt states she is not having any pain.

## 2017-08-18 NOTE — Discharge Instructions (Signed)
Vaginal Bleeding During Pregnancy, First Trimester °A small amount of bleeding (spotting) from the vagina is common in early pregnancy. Sometimes the bleeding is normal and is not a problem, and sometimes it is a sign of something serious. Be sure to tell your doctor about any bleeding from your vagina right away. °Follow these instructions at home: °· Watch your condition for any changes. °· Follow your doctor's instructions about how active you can be. °· If you are on bed rest: °? You may need to stay in bed and only get up to use the bathroom. °? You may be allowed to do some activities. °? If you need help, make plans for someone to help you. °· Write down: °? The number of pads you use each day. °? How often you change pads. °? How soaked (saturated) your pads are. °· Do not use tampons. °· Do not douche. °· Do not have sex or orgasms until your doctor says it is okay. °· If you pass any tissue from your vagina, save the tissue so you can show it to your doctor. °· Only take medicines as told by your doctor. °· Do not take aspirin because it can make you bleed. °· Keep all follow-up visits as told by your doctor. °Contact a doctor if: °· You bleed from your vagina. °· You have cramps. °· You have labor pains. °· You have a fever that does not go away after you take medicine. °Get help right away if: °· You have very bad cramps in your back or belly (abdomen). °· You pass large clots or tissue from your vagina. °· You bleed more. °· You feel light-headed or weak. °· You pass out (faint). °· You have chills. °· You are leaking fluid or have a gush of fluid from your vagina. °· You pass out while pooping (having a bowel movement). °This information is not intended to replace advice given to you by your health care provider. Make sure you discuss any questions you have with your health care provider. °Document Released: 10/10/2013 Document Revised: 11/01/2015 Document Reviewed: 01/31/2013 °[employer] Interactive  Patient Education © 2018 [employer] ° °

## 2017-08-18 NOTE — MAU Provider Note (Addendum)
Chief Complaint: Vaginal Bleeding   First Provider Initiated Contact with Patient 08/18/17 1740      SUBJECTIVE HPI: Catherine Underwood is a 37 y.o. [id number] at [redacted]w[redacted]d by LMP who presents to maternity admissions reporting positive pregnancy 3 days ago with onset of light bleeding when wiping today.  There is no pain. She has no other symptoms. She has not tried any treatments. She has hx of ectopic pregnancy x 1 in 2007, vaginal delivery at term 2006 and C/S at term in 2008.   HPI  Past Medical History:  Diagnosis Date  . Arthritis    Past Surgical History:  Procedure Laterality Date  . CESAREAN SECTION    . CESAREAN SECTION     Social History   Socioeconomic History  . Marital status: Single    Spouse name: Not on file  . Number of children: Not on file  . Years of education: Not on file  . Highest education level: Not on file  Social Needs  . Financial resource strain: Not on file  . Food insecurity - worry: Not on file  . Food insecurity - inability: Not on file  . Transportation needs - medical: Not on file  . Transportation needs - non-medical: Not on file  Occupational History  . Not on file  Tobacco Use  . Smoking status: Current Some Day Smoker    Types: Cigarettes  . Smokeless tobacco: Never Used  . Tobacco comment: quit 08/18/2017  Substance and Sexual Activity  . Alcohol use: No  . Drug use: No  . Sexual activity: Yes    Birth control/protection: None  Other Topics Concern  . Not on file  Social History Narrative  . Not on file   No current facility-administered medications on file prior to encounter.    Current Outpatient Medications on File Prior to Encounter  Medication Sig Dispense Refill  . acetaminophen (TYLENOL) 650 MG CR tablet Take 1,300 mg by mouth daily as needed for pain (and headache).     . etanercept (ENBREL) 25 MG injection Inject 25 mg into the skin once a week. Thursdays    . benzonatate (TESSALON) 100 MG capsule Take 1 capsule (100 mg  total) by mouth every 8 (eight) hours. (Patient not taking: Reported on 08/18/2017) 21 capsule 0  . ibuprofen (ADVIL,MOTRIN) 800 MG tablet Take 1 tablet (800 mg total) by mouth 3 (three) times daily. (Patient not taking: Reported on 08/18/2017) 21 tablet 0  . predniSONE (STERAPRED UNI-PAK 21 TAB) 10 MG (21) TBPK tablet Take by mouth daily. Take as directed. (Patient not taking: Reported on 08/18/2017) 21 tablet 0  . traMADol (ULTRAM) 50 MG tablet Take 1 tablet (50 mg total) by mouth every 8 (eight) hours. (Patient not taking: Reported on 08/18/2017) 15 tablet 0   Allergies  Allergen Reactions  . Red Dye Swelling    Throat swells  . Meloxicam Hives and Itching  . Penicillins Other (See Comments)    Yeast infection Has patient had a PCN reaction causing immediate rash, facial/tongue/throat swelling, SOB or lightheadedness with hypotension: no Has patient had a PCN reaction causing severe rash involving mucus membranes or skin necrosis: no Has patient had a PCN reaction that required hospitalization: no Has patient had a PCN reaction occurring within the last 10 years: yes If all of the above answers are "NO", then may proceed with Cephalosporin use.   Marland Kitchen Morphine And Related Hives    ROS:  Review of Systems  Constitutional: Negative for  chills, fatigue and fever.  Respiratory: Negative for shortness of breath.   Cardiovascular: Negative for chest pain.  Gastrointestinal: Negative for nausea and vomiting.  Genitourinary: Positive for vaginal bleeding. Negative for difficulty urinating, dysuria, flank pain, pelvic pain, vaginal discharge and vaginal pain.  Neurological: Negative for dizziness and headaches.  Psychiatric/Behavioral: Negative.      I have reviewed patient's Past Medical Hx, Surgical Hx, Family Hx, Social Hx, medications and allergies.   Physical Exam   Patient Vitals for the past 24 hrs:  BP Temp Pulse Resp SpO2 Weight  08/18/17 1711 115/64 98.5 F (36.9 C) 87 18 100 % -   08/18/17 1654 - - - - - 210 lb 4 oz (95.4 kg)   Constitutional: Well-developed, well-nourished female in no acute distress.  Cardiovascular: normal rate Respiratory: normal effort GI: Abd soft, non-tender. Pos BS x 4 MS: Extremities nontender, no edema, normal ROM Neurologic: Alert and oriented x 4.  GU: Neg CVAT.  PELVIC EXAM: Cervix pink, visually closed, without lesion, scant white creamy discharge, no bleeding noted, vaginal walls and external genitalia normal Bimanual exam: Cervix 0/long/high, firm, anterior, neg CMT, uterus nontender, nonenlarged, adnexa without tenderness, enlargement, or mass  LAB RESULTS Results for orders placed or performed during the hospital encounter of 08/18/17 (from the past 24 hour(s))  Urinalysis, Routine w reflex microscopic     Status: Abnormal   Collection Time: 08/18/17  4:50 PM  Result Value Ref Range   Color, Urine YELLOW YELLOW   APPearance CLOUDY (A) CLEAR   Specific Gravity, Urine 1.020 1.005 - 1.030   pH 6.0 5.0 - 8.0   Glucose, UA NEGATIVE NEGATIVE mg/dL   Hgb urine dipstick NEGATIVE NEGATIVE   Bilirubin Urine NEGATIVE NEGATIVE   Ketones, ur NEGATIVE NEGATIVE mg/dL   Protein, ur NEGATIVE NEGATIVE mg/dL   Nitrite POSITIVE (A) NEGATIVE   Leukocytes, UA MODERATE (A) NEGATIVE   RBC / HPF 0-5 0 - 5 RBC/hpf   WBC, UA 6-30 0 - 5 WBC/hpf   Bacteria, UA MANY (A) NONE SEEN   Squamous Epithelial / LPF 6-30 (A) NONE SEEN   Mucus PRESENT   Pregnancy, urine POC     Status: Abnormal   Collection Time: 08/18/17  5:23 PM  Result Value Ref Range   Preg Test, Ur POSITIVE (A) NEGATIVE  CBC     Status: Abnormal   Collection Time: 08/18/17  5:46 PM  Result Value Ref Range   WBC 3.4 (L) 4.0 - 10.5 K/uL   RBC 5.48 (H) 3.87 - 5.11 MIL/uL   Hemoglobin 14.0 12.0 - 15.0 g/dL   HCT 72.0 94.7 - 09.6 %   MCV 79.2 78.0 - 100.0 fL   MCH 25.5 (L) 26.0 - 34.0 pg   MCHC 32.3 30.0 - 36.0 g/dL   RDW 28.3 66.2 - 94.7 %   Platelets 185 150 - 400 K/uL  hCG,  quantitative, pregnancy     Status: Abnormal   Collection Time: 08/18/17  5:46 PM  Result Value Ref Range   hCG, Beta Chain, Quant, S 4,067 (H) <5 mIU/mL  ABO/Rh     Status: None   Collection Time: 08/18/17  5:46 PM  Result Value Ref Range   ABO/RH(D)      O POS Performed at [facility], [address], [place], [place] [address]   Wet prep, genital     Status: Abnormal   Collection Time: 08/18/17  5:53 PM  Result Value Ref Range   Yeast Wet Prep  HPF POC PRESENT (A) NONE SEEN   Trich, Wet Prep NONE SEEN NONE SEEN   Clue Cells Wet Prep HPF POC PRESENT (A) NONE SEEN   WBC, Wet Prep HPF POC MODERATE (A) NONE SEEN   Sperm NONE SEEN     --/--/O POS Performed at [facility], [address] [place], [place] [address]  [phone]/12 [phone])  IMAGING US Ob Comp Less 14 Wks  Result Date: 08/18/2017 CLINICAL DATA:  Initial evaluation for mild vaginal bleeding. Early pregnancy. History of ectopic pregnancy. EXAM: OBSTETRIC <14 WK [place] AND TRANSVAGINAL OB US TECHNIQUE: Both transabdominal and transvaginal ultrasound examinations were performed for complete evaluation of the gestation as well as the maternal uterus, adnexal regions, and pelvic cul-de-sac. Transvaginal technique was performed to assess early pregnancy. COMPARISON:  None. FINDINGS: Intrauterine gestational sac: Single Yolk sac:  Not visualized. Embryo:  Not visualized.  Not visualized. Cardiac Activity: N/A Heart Rate: N/A  bpm MSD: 6.2 mm   5 w   2 d Subchorionic hemorrhage:  None visualized. Maternal uterus/adnexae: Ovaries are normal in size and appearance bilaterally. Small right ovarian corpus luteal cyst noted. Small volume free fluid within the pelvis. IMPRESSION: 1. Probable early intrauterine gestational sac, but no yolk sac, fetal pole, or cardiac activity yet visualized. Estimated gestational age [redacted] weeks and 2 days by mean sac diameter. Recommend follow-up quantitative B-HCG levels and follow-up US in 14 days to assess  viability. This recommendation follows SRU consensus guidelines: Diagnostic Criteria for Nonviable Pregnancy Early in the First Trimester. Malva Limes Med 2013; 416:3845-36. 2. No other acute maternal uterine or adnexal abnormality identified. Electronically Signed   By: Rise Mu M.D.   On: 08/18/2017 18:47   US Ob Transvaginal  Result Date: 08/18/2017 CLINICAL DATA:  Initial evaluation for mild vaginal bleeding. Early pregnancy. History of ectopic pregnancy. EXAM: OBSTETRIC <14 WK [place] AND TRANSVAGINAL OB US TECHNIQUE: Both transabdominal and transvaginal ultrasound examinations were performed for complete evaluation of the gestation as well as the maternal uterus, adnexal regions, and pelvic cul-de-sac. Transvaginal technique was performed to assess early pregnancy. COMPARISON:  None. FINDINGS: Intrauterine gestational sac: Single Yolk sac:  Not visualized. Embryo:  Not visualized.  Not visualized. Cardiac Activity: N/A Heart Rate: N/A  bpm MSD: 6.2 mm   5 w   2 d Subchorionic hemorrhage:  None visualized. Maternal uterus/adnexae: Ovaries are normal in size and appearance bilaterally. Small right ovarian corpus luteal cyst noted. Small volume free fluid within the pelvis. IMPRESSION: 1. Probable early intrauterine gestational sac, but no yolk sac, fetal pole, or cardiac activity yet visualized. Estimated gestational age [redacted] weeks and 2 days by mean sac diameter. Recommend follow-up quantitative B-HCG levels and follow-up US in 14 days to assess viability. This recommendation follows SRU consensus guidelines: Diagnostic Criteria for Nonviable Pregnancy Early in the First Trimester. Malva Limes Med 2013; 468:0321-22. 2. No other acute maternal uterine or adnexal abnormality identified. Electronically Signed   By: Rise Mu M.D.   On: 08/18/2017 18:47   MAU Management/MDM: CBC, quant hcg, HIV, wet prep, GCC US abdom and transvaginal results pending  Report to Donette Larry, CNM  Sharen Counter Certified Nurse-Midwife 08/18/2017  8:35 PM No IUP or adnexal mass seen on [place], findings could indicate early pregnancy, ectopic pregnancy, or failed pregnancy-discussed with pt. Will follow quant in 48 hrs. Stable for discharge home.   A/P: 1. Pregnancy, location unknown   2. Vaginal bleeding in pregnancy, first trimester   3. Yeast  vaginitis   4. Blood type, Rh positive    Discharge home Follow up in [facility] on 08/21/17 @0900  for labs Ectopic/return precautions Rx Terazol  Allergies as of 08/18/2017      Reactions   Red Dye Swelling   Throat swells   Meloxicam Hives, Itching   Penicillins Other (See Comments)   Yeast infection Has patient had a PCN reaction causing immediate rash, facial/tongue/throat swelling, SOB or lightheadedness with hypotension: no Has patient had a PCN reaction causing severe rash involving mucus membranes or skin necrosis: no Has patient had a PCN reaction that required hospitalization: no Has patient had a PCN reaction occurring within the last 10 years: yes If all of the above answers are "NO", then may proceed with Cephalosporin use.   Morphine And Related Hives      Medication List    STOP taking these medications   benzonatate 100 MG capsule Commonly known as:  TESSALON   ibuprofen 800 MG tablet Commonly known as:  ADVIL,MOTRIN   predniSONE 10 MG (21) Tbpk tablet Commonly known as:  STERAPRED UNI-PAK 21 TAB   traMADol 50 MG tablet Commonly known as:  ULTRAM     TAKE these medications   acetaminophen 650 MG CR tablet Commonly known as:  TYLENOL Take 1,300 mg by mouth daily as needed for pain (and headache).   etanercept 25 MG injection Commonly known as:  ENBREL Inject 25 mg into the skin once a week. Thursdays   terconazole 0.4 % vaginal cream Commonly known as:  TERAZOL 7 Place 1 applicator vaginally at bedtime.      Donette Larry, CNM  08/18/2017 8:37 PM

## 2017-08-19 LAB — HIV ANTIBODY (ROUTINE TESTING W REFLEX): HIV Screen 4th Generation wRfx: NONREACTIVE

## 2017-08-19 LAB — GC/CHLAMYDIA PROBE AMP (~~LOC~~) NOT AT ARMC
Chlamydia: NEGATIVE
Neisseria Gonorrhea: NEGATIVE

## 2017-08-21 ENCOUNTER — Ambulatory Visit: Payer: BLUE CROSS/BLUE SHIELD

## 2017-08-21 LAB — CULTURE, OB URINE

## 2017-08-22 ENCOUNTER — Telehealth: Payer: Self-pay | Admitting: Certified Nurse Midwife

## 2017-08-22 DIAGNOSIS — B9620 Unspecified Escherichia coli [E. coli] as the cause of diseases classified elsewhere: Secondary | ICD-10-CM

## 2017-08-22 DIAGNOSIS — N390 Urinary tract infection, site not specified: Principal | ICD-10-CM

## 2017-08-22 MED ORDER — SULFAMETHOXAZOLE-TRIMETHOPRIM 800-160 MG PO TABS: 1.0000 | ORAL_TABLET | Freq: Two times a day (BID) | ORAL | 0 refills | Status: DC

## 2017-08-22 NOTE — Telephone Encounter (Signed)
Patient called. No answer. Rx sent to pharmacy for UTI. Septra ordered due to patient being allergic to other medications.

## 2017-08-23 ENCOUNTER — Telehealth: Payer: Self-pay | Admitting: Certified Nurse Midwife

## 2017-08-23 MED ORDER — NITROFURANTOIN MONOHYD MACRO 100 MG PO CAPS: 100.0000 mg | ORAL_CAPSULE | Freq: Two times a day (BID) | ORAL | 0 refills | Status: DC

## 2017-08-23 NOTE — Telephone Encounter (Signed)
+  UTI in pregnancy. Was sent Rx for Bactrim. Recommend Macrobid instead d/t safety. Pt notified.

## 2017-10-09 ENCOUNTER — Encounter (HOSPITAL_COMMUNITY): Payer: Self-pay

## 2017-10-09 ENCOUNTER — Inpatient Hospital Stay (HOSPITAL_COMMUNITY)
Admission: AD | Admit: 2017-10-09 | Discharge: 2017-10-09 | Disposition: A | Discharge status: Home / Self Care | Payer: BLUE CROSS/BLUE SHIELD | Source: Ambulatory Visit | Attending: Obstetrics & Gynecology | Admitting: Obstetrics & Gynecology

## 2017-10-09 ENCOUNTER — Other Ambulatory Visit: Payer: Self-pay

## 2017-10-09 DIAGNOSIS — Z3A12 12 weeks gestation of pregnancy: Secondary | ICD-10-CM | POA: Diagnosis not present

## 2017-10-09 DIAGNOSIS — O26891 Other specified pregnancy related conditions, first trimester: Secondary | ICD-10-CM | POA: Diagnosis not present

## 2017-10-09 DIAGNOSIS — O99331 Smoking (tobacco) complicating pregnancy, first trimester: Secondary | ICD-10-CM | POA: Insufficient documentation

## 2017-10-09 DIAGNOSIS — Z885 Allergy status to narcotic agent status: Secondary | ICD-10-CM | POA: Insufficient documentation

## 2017-10-09 DIAGNOSIS — F17210 Nicotine dependence, cigarettes, uncomplicated: Secondary | ICD-10-CM | POA: Insufficient documentation

## 2017-10-09 DIAGNOSIS — Z880 Allergy status to penicillin: Secondary | ICD-10-CM | POA: Diagnosis not present

## 2017-10-09 DIAGNOSIS — R109 Unspecified abdominal pain: Secondary | ICD-10-CM | POA: Diagnosis not present

## 2017-10-09 LAB — WET PREP, GENITAL
Sperm: NONE SEEN
TRICH WET PREP: NONE SEEN
Yeast Wet Prep HPF POC: NONE SEEN

## 2017-10-09 LAB — URINALYSIS, ROUTINE W REFLEX MICROSCOPIC
Bilirubin Urine: NEGATIVE
Glucose, UA: NEGATIVE mg/dL
HGB URINE DIPSTICK: NEGATIVE
Ketones, ur: NEGATIVE mg/dL
NITRITE: NEGATIVE
PROTEIN: NEGATIVE mg/dL
Specific Gravity, Urine: 1.015 (ref 1.005–1.030)
pH: 8 (ref 5.0–8.0)

## 2017-10-09 LAB — CBC
HCT: 33.9 % — ABNORMAL LOW (ref 36.0–46.0)
Hemoglobin: 11.5 g/dL — ABNORMAL LOW (ref 12.0–15.0)
MCH: 26.9 pg (ref 26.0–34.0)
MCHC: 33.9 g/dL (ref 30.0–36.0)
MCV: 79.2 fL (ref 78.0–100.0)
Platelets: 238 10*3/uL (ref 150–400)
RBC: 4.28 MIL/uL (ref 3.87–5.11)
RDW: 12.9 % (ref 11.5–15.5)
WBC: 6.7 10*3/uL (ref 4.0–10.5)

## 2017-10-09 LAB — COMPREHENSIVE METABOLIC PANEL
ALBUMIN: 3.5 g/dL (ref 3.5–5.0)
ALT: 12 U/L — AB (ref 14–54)
AST: 16 U/L (ref 15–41)
Alkaline Phosphatase: 59 U/L (ref 38–126)
Anion gap: 7 (ref 5–15)
BILIRUBIN TOTAL: 0.3 mg/dL (ref 0.3–1.2)
BUN: 9 mg/dL (ref 6–20)
CO2: 23 mmol/L (ref 22–32)
Calcium: 9.1 mg/dL (ref 8.9–10.3)
Chloride: 105 mmol/L (ref 101–111)
Creatinine, Ser: 0.58 mg/dL (ref 0.44–1.00)
GFR calc Af Amer: >60 mL/min (ref >60)
GFR calc non Af Amer: >60 mL/min (ref >60)
GLUCOSE: 85 mg/dL (ref 65–99)
Potassium: 3.7 mmol/L (ref 3.5–5.1)
Sodium: 135 mmol/L (ref 135–145)
TOTAL PROTEIN: 7.4 g/dL (ref 6.5–8.1)

## 2017-10-09 LAB — LIPASE, BLOOD: Lipase: 24 U/L (ref 11–51)

## 2017-10-09 NOTE — Progress Notes (Addendum)
G4P2 early pregnant at [redacted] wks. Here dt pressure on the abdomen. Denies LOF or bleeding.   While at work at 100 started cramping and feeling tightening around abdomen.  Last intercourse last night.   Urine collected.   1300: Doppler: 110 Provider made aware right away   Provider at bs assessing. bs U/S. FHR 136  1336: wetprep and GC blind swab done.   1615: d/c instructions given with pt understanding. Pt left unit via ambulatory.

## 2017-10-09 NOTE — MAU Provider Note (Signed)
Chief Complaint: Abdominal Cramping   First Provider Initiated Contact with Patient 10/09/17 1315      SUBJECTIVE HPI: Catherine Underwood is a 37 y.o. [id number] at [redacted]w[redacted]d by LMP who presents to maternity admissions reporting onset of generalized cramping abdominal pain in her upper and lower abdomen today.  She was seen for light bleeding/cramping on 08/18/17 and had [place] with gestational sac only, and was scheduled to return for hcg follow up but did not return. She plans to start care at [facility] but has a busy work schedule and has not scheduled an appointment yet.  There is no bleeding. There is urinary frequency.  There are no other associated symptoms. The pain is intermittent, throughout the entire abdomen, and described as cramping in nature. She has not tried any treatments. She denies constipation, diarrhea, or n/v. She denies vaginal bleeding, vaginal itching/burning, h/a, dizziness, or fever/chills.     HPI  Past Medical History:  Diagnosis Date  . Arthritis    Past Surgical History:  Procedure Laterality Date  . CESAREAN SECTION    . CESAREAN SECTION     Social History   Socioeconomic History  . Marital status: Single    Spouse name: Not on file  . Number of children: Not on file  . Years of education: Not on file  . Highest education level: Not on file  Occupational History  . Not on file  Social Needs  . Financial resource strain: Not on file  . Food insecurity:    Worry: Not on file    Inability: Not on file  . Transportation needs:    Medical: Not on file    Non-medical: Not on file  Tobacco Use  . Smoking status: Current Some Day Smoker    Types: Cigarettes  . Smokeless tobacco: Never Used  . Tobacco comment: quit 08/18/2017  Substance and Sexual Activity  . Alcohol use: No  . Drug use: No  . Sexual activity: Yes    Birth control/protection: None  Lifestyle  . Physical activity:    Days per week: Not on file    Minutes per session: Not on file  . Stress:  Not on file  Relationships  . Social connections:    Talks on phone: Not on file    Gets together: Not on file    Attends religious service: Not on file    Active member of club or organization: Not on file    Attends meetings of clubs or organizations: Not on file    Relationship status: Not on file  . Intimate partner violence:    Fear of current or ex partner: Not on file    Emotionally abused: Not on file    Physically abused: Not on file    Forced sexual activity: Not on file  Other Topics Concern  . Not on file  Social History Narrative  . Not on file   No current facility-administered medications on file prior to encounter.    Current Outpatient Medications on File Prior to Encounter  Medication Sig Dispense Refill  . etanercept (ENBREL) 25 MG injection Inject 25 mg into the skin once a week. Thursdays    . predniSONE (DELTASONE) 10 MG tablet Take 10 mg by mouth daily as needed.     Allergies  Allergen Reactions  . Red Dye Swelling    Throat swells  . Meloxicam Hives and Itching  . Penicillins Other (See Comments)    Yeast infection Has patient had a PCN  reaction causing immediate rash, facial/tongue/throat swelling, SOB or lightheadedness with hypotension: no Has patient had a PCN reaction causing severe rash involving mucus membranes or skin necrosis: no Has patient had a PCN reaction that required hospitalization: no Has patient had a PCN reaction occurring within the last 10 years: yes If all of the above answers are "NO", then may proceed with Cephalosporin use.   Marland Kitchen Morphine And Related Hives    ROS:  Review of Systems  Constitutional: Negative for chills, fatigue and fever.  Respiratory: Negative for shortness of breath.   Cardiovascular: Negative for chest pain.  Gastrointestinal: Positive for abdominal pain. Negative for nausea and vomiting.  Genitourinary: Positive for pelvic pain. Negative for difficulty urinating, dysuria, flank pain, vaginal  bleeding, vaginal discharge and vaginal pain.  Musculoskeletal: Negative for back pain.  Neurological: Negative for dizziness and headaches.  Psychiatric/Behavioral: Negative.      I have reviewed patient's Past Medical Hx, Surgical Hx, Family Hx, Social Hx, medications and allergies.   Physical Exam   Patient Vitals for the past 24 hrs:  BP Temp Temp src Pulse Resp SpO2 Height Weight  10/09/17 1549 (!) 109/58 - - 90 - - - -  10/09/17 1304 (!) 109/58 98.3 F (36.8 C) Oral (!) 104 18 - - -  10/09/17 1300 - - - - - 97 % - -  10/09/17 1252 - - - - - - 4\' 11"  (1.499 m) 213 lb 1.9 oz (96.7 kg)   Constitutional: Well-developed, well-nourished female in no acute distress.  Cardiovascular: normal rate Respiratory: normal effort GI: Abd soft, non-tender.No rebound tenderness or guarding. Pos BS x 4 MS: Extremities nontender, no edema, normal ROM Neurologic: Alert and oriented x 4.  GU: Neg CVAT.  PELVIC EXAM: Wet prep/ GCC collected by blind swab  FHT not detected by doppler  LAB RESULTS Results for orders placed or performed during the hospital encounter of 10/09/17 (from the past 24 hour(s))  Urinalysis, Routine w reflex microscopic     Status: Abnormal   Collection Time: 10/09/17 12:50 PM  Result Value Ref Range   Color, Urine YELLOW YELLOW   APPearance HAZY (A) CLEAR   Specific Gravity, Urine 1.015 1.005 - 1.030   pH 8.0 5.0 - 8.0   Glucose, UA NEGATIVE NEGATIVE mg/dL   Hgb urine dipstick NEGATIVE NEGATIVE   Bilirubin Urine NEGATIVE NEGATIVE   Ketones, ur NEGATIVE NEGATIVE mg/dL   Protein, ur NEGATIVE NEGATIVE mg/dL   Nitrite NEGATIVE NEGATIVE   Leukocytes, UA TRACE (A) NEGATIVE   RBC / HPF 0-5 0 - 5 RBC/hpf   WBC, UA 0-5 0 - 5 WBC/hpf   Bacteria, UA RARE (A) NONE SEEN   Squamous Epithelial / LPF 11-20 0 - 5   Amorphous Crystal PRESENT   Wet prep, genital     Status: Abnormal   Collection Time: 10/09/17  1:36 PM  Result Value Ref Range   Yeast Wet Prep HPF POC NONE  SEEN NONE SEEN   Trich, Wet Prep NONE SEEN NONE SEEN   Clue Cells Wet Prep HPF POC PRESENT (A) NONE SEEN   WBC, Wet Prep HPF POC FEW (A) NONE SEEN   Sperm NONE SEEN   Lipase, blood     Status: None   Collection Time: 10/09/17  1:53 PM  Result Value Ref Range   Lipase 24 11 - 51 U/L  Comprehensive metabolic panel     Status: Abnormal   Collection Time: 10/09/17  1:53 PM  Result Value  Ref Range   Sodium 135 135 - 145 mmol/L   Potassium 3.7 3.5 - 5.1 mmol/L   Chloride 105 101 - 111 mmol/L   CO2 23 22 - 32 mmol/L   Glucose, Bld 85 65 - 99 mg/dL   BUN 9 6 - 20 mg/dL   Creatinine, Ser 0.17 0.44 - 1.00 mg/dL   Calcium 9.1 8.9 - 51.0 mg/dL   Total Protein 7.4 6.5 - 8.1 g/dL   Albumin 3.5 3.5 - 5.0 g/dL   AST 16 15 - 41 U/L   ALT 12 (L) 14 - 54 U/L   Alkaline Phosphatase 59 38 - 126 U/L   Total Bilirubin 0.3 0.3 - 1.2 mg/dL   GFR calc non Af Amer >60 >60 mL/min   GFR calc Af Amer >60 >60 mL/min   Anion gap 7 5 - 15  CBC     Status: Abnormal   Collection Time: 10/09/17  1:53 PM  Result Value Ref Range   WBC 6.7 4.0 - 10.5 K/uL   RBC 4.28 3.87 - 5.11 MIL/uL   Hemoglobin 11.5 (L) 12.0 - 15.0 g/dL   HCT 25.8 (L) 52.7 - 78.2 %   MCV 79.2 78.0 - 100.0 fL   MCH 26.9 26.0 - 34.0 pg   MCHC 33.9 30.0 - 36.0 g/dL   RDW 42.3 53.6 - 14.4 %   Platelets 238 150 - 400 K/uL    --/--/O POS Performed at [facility], [address], [place], [place] [address]  (03/12 1746)  IMAGING  Limited OB US Date:10/09/17 EDD : 04/21/18 based on LMP Viability:  FHT 141 CRL measurement c/w LMP dates Placenta, amniotic fluid subjectively wnl  MAU Management/MDM: CBC, CMP, lipase, wet prep, GCC No acute abdomen, no evidence of infection with normal WBC. No evidence of pancreatitis with normal lipase. Pain is generalized, not low in pelvis. No abnormalities on UA, wet prep with clue cells but does not meet full criteria for BV with no other symptoms (no whiff, few WBCs, no pH results).   Pain is  likely digestive in nature, discussed keeping log of when pain occurs in relation to meals Safe OTC meds list given Message sent to [facility] to establish high risk pregnancy care r/t rheumatoid arthritis on meds Return to MAU if pain persists or worsens or is associated with other symptoms like n/v or fever Pt discharged with strict return precautions.  ASSESSMENT 1. Abdominal pain during pregnancy in first trimester     PLAN Discharge home Allergies as of 10/09/2017      Reactions   Red Dye Swelling   Throat swells   Meloxicam Hives, Itching   Penicillins Other (See Comments)   Yeast infection Has patient had a PCN reaction causing immediate rash, facial/tongue/throat swelling, SOB or lightheadedness with hypotension: no Has patient had a PCN reaction causing severe rash involving mucus membranes or skin necrosis: no Has patient had a PCN reaction that required hospitalization: no Has patient had a PCN reaction occurring within the last 10 years: yes If all of the above answers are "NO", then may proceed with Cephalosporin use.   Morphine And Related Hives      Medication List    STOP taking these medications   nitrofurantoin (macrocrystal-monohydrate) 100 MG capsule Commonly known as:  MACROBID   sulfamethoxazole-trimethoprim 800-160 MG tablet Commonly known as:  BACTRIM DS,SEPTRA DS   terconazole 0.4 % vaginal cream Commonly known as:  TERAZOL 7     TAKE these medications  etanercept 25 MG injection Commonly known as:  ENBREL Inject 25 mg into the skin once a week. Thursdays   predniSONE 10 MG tablet Commonly known as:  DELTASONE Take 10 mg by mouth daily as needed.      Follow-up Information    [facility] Follow up.   Specialty:  Obstetrics and Gynecology Why:  The office will call you with an appointment for high risk pregnancy. Return to MAU as needed for emergencies. Contact information: [address] [place]  [place] [address] [phone]          Sharen Counter Certified Nurse-Midwife 10/09/2017  5:31 PM

## 2017-10-12 LAB — GC/CHLAMYDIA PROBE AMP (~~LOC~~) NOT AT ARMC
Chlamydia: NEGATIVE
Neisseria Gonorrhea: NEGATIVE

## 2017-10-13 ENCOUNTER — Telehealth: Payer: Self-pay | Admitting: General Practice

## 2017-10-13 NOTE — Telephone Encounter (Signed)
Called patient in regards to appointment on 10/30/17 at 9:55am.  Patient voiced understanding.

## 2017-10-13 NOTE — Telephone Encounter (Signed)
-----   Message from Hurshel Party, CNM sent at 10/09/2017  4:13 PM EDT ----- Regarding: New [facility] pt This pt is 12 weeks with rheumatoid arthritis on medications and per Dr Macon Large, should start with [facility], not the health dept.  Please call her with first available appt for new OB visit.  Her pregnancy is uncomplicated otherwise. Thank you.

## 2017-10-30 ENCOUNTER — Encounter: Payer: Self-pay | Admitting: Obstetrics and Gynecology

## 2017-10-30 ENCOUNTER — Ambulatory Visit (INDEPENDENT_AMBULATORY_CARE_PROVIDER_SITE_OTHER): Payer: BLUE CROSS/BLUE SHIELD | Admitting: Obstetrics and Gynecology

## 2017-10-30 VITALS — BP 121/69 | HR 104 | Wt 214.3 lb

## 2017-10-30 DIAGNOSIS — Z3009 Encounter for other general counseling and advice on contraception: Secondary | ICD-10-CM

## 2017-10-30 DIAGNOSIS — Z124 Encounter for screening for malignant neoplasm of cervix: Secondary | ICD-10-CM | POA: Diagnosis not present

## 2017-10-30 DIAGNOSIS — O0992 Supervision of high risk pregnancy, unspecified, second trimester: Secondary | ICD-10-CM

## 2017-10-30 DIAGNOSIS — Z113 Encounter for screening for infections with a predominantly sexual mode of transmission: Secondary | ICD-10-CM

## 2017-10-30 DIAGNOSIS — Z1151 Encounter for screening for human papillomavirus (HPV): Secondary | ICD-10-CM | POA: Diagnosis not present

## 2017-10-30 DIAGNOSIS — M069 Rheumatoid arthritis, unspecified: Secondary | ICD-10-CM | POA: Insufficient documentation

## 2017-10-30 DIAGNOSIS — Z98891 History of uterine scar from previous surgery: Secondary | ICD-10-CM | POA: Insufficient documentation

## 2017-10-30 DIAGNOSIS — O34219 Maternal care for unspecified type scar from previous cesarean delivery: Secondary | ICD-10-CM

## 2017-10-30 DIAGNOSIS — O0991 Supervision of high risk pregnancy, unspecified, first trimester: Secondary | ICD-10-CM

## 2017-10-30 MED ORDER — ASPIRIN EC 81 MG PO TBEC: 81.0000 mg | DELAYED_RELEASE_TABLET | Freq: Every day | ORAL | 2 refills | Status: DC

## 2017-10-30 NOTE — Patient Instructions (Signed)

## 2017-10-30 NOTE — Progress Notes (Signed)
Subjective:  Catherine Underwood is a 37 y.o. F7T0240 at [redacted]w[redacted]d being seen today for her first OB visit. EDD by LMP and confirmed by first trimester U/S. Pt with H/O RA. Followed by Rheumatology at [facility]. MD stopped her Enbrel. She is using Prednisone PRN. Last RA flare up was 5 weeks ago. She has also stopped smoking. H/O TSVD and C/S ( secondary to FTP). She is also AMA.   She is currently monitored for the following issues for this high-risk pregnancy and has Preventative health care; Supervision of high risk pregnancy, antepartum, first trimester; Rheumatoid arthritis (HCC); History of cesarean section; and Unwanted fertility on their problem list.  Patient reports no complaints.  Contractions: Not present. Vag. Bleeding: None.  Movement: Present. Denies leaking of fluid.   The following portions of the patient's history were reviewed and updated as appropriate: allergies, current medications, past family history, past medical history, past social history, past surgical history and problem list. Problem list updated.  Objective:   Vitals:   10/30/17 1017  BP: 121/69  Pulse: (!) 104  Weight: 214 lb 4.8 oz (97.2 kg)    Fetal Status: Fetal Heart Rate (bpm): 157   Movement: Present     General:  Alert, oriented and cooperative. Patient is in no acute distress.  Skin: Skin is warm and dry. No rash noted.   Cardiovascular: Normal heart rate noted  Respiratory: Normal respiratory effort, no problems with respiration noted  Abdomen: Soft, gravid, appropriate for gestational age. Pain/Pressure: Absent     Pelvic:  Cervical exam performed        Extremities: Normal range of motion.  Edema: None  Mental Status: Normal mood and affect. Normal behavior. Normal judgment and thought content.   Urinalysis:      Assessment and Plan:  Pregnancy: [id number] at [redacted]w[redacted]d  1. Supervision of high risk pregnancy, antepartum, first trimester Prenatal care and labs reviewed with pt. -  Genetic Screening - Cytology - PAP - Hemoglobinopathy Evaluation - SMN1 COPY NUMBER ANALYSIS (SMA Carrier Screen) - Obstetric Panel, Including HIV - Cystic fibrosis gene test - [place] MFM OB DETAIL +14 WK; Future  2. Rheumatoid arthritis, involving unspecified site, unspecified rheumatoid factor presence (HCC) RA and pregnancy discussed with pt. Will start BASA qd. - [place] MFM OB DETAIL +14 WK; Future - aspirin EC 81 MG tablet; Take 1 tablet (81 mg total) by mouth daily. Take after 12 weeks for prevention of preeclampsia later in pregnancy  Dispense: 300 tablet; Refill: 2 - Protein / creatinine ratio, urine - TSH  3. History of cesarean section Will need to discuss RLTCS vs TOLAC at later date  4. Unwanted fertility Will need to sign papers at later date  Preterm labor symptoms and general obstetric precautions including but not limited to vaginal bleeding, contractions, leaking of fluid and fetal movement were reviewed in detail with the patient. Please refer to After Visit Summary for other counseling recommendations.  Return in about 1 month (around 11/27/2017) for OB visit.   Hermina Staggers, MD

## 2017-10-31 LAB — TSH: TSH: 1.17 u[IU]/mL (ref 0.450–4.500)

## 2017-10-31 LAB — PROTEIN / CREATININE RATIO, URINE
Creatinine, Urine: 168.4 mg/dL
Protein, Ur: 14.9 mg/dL
Protein/Creat Ratio: 88 mg/g creat (ref 0–200)

## 2017-11-03 ENCOUNTER — Encounter: Payer: Self-pay | Admitting: *Deleted

## 2017-11-04 LAB — CYTOLOGY - PAP
Chlamydia: NEGATIVE
Diagnosis: NEGATIVE
HPV (WINDOPATH): NOT DETECTED
NEISSERIA GONORRHEA: NEGATIVE

## 2017-11-06 LAB — OBSTETRIC PANEL, INCLUDING HIV
Basophils Absolute: 0 10*3/uL (ref 0.0–0.2)
Basos: 0 %
EOS (ABSOLUTE): 0.1 10*3/uL (ref 0.0–0.4)
EOS: 2 %
HEMOGLOBIN: 11.7 g/dL (ref 11.1–15.9)
HEP B S AG: NEGATIVE
HIV Screen 4th Generation wRfx: NONREACTIVE
Hematocrit: 34.7 % (ref 34.0–46.6)
IMMATURE GRANS (ABS): 0 10*3/uL (ref 0.0–0.1)
Immature Granulocytes: 0 %
LYMPHS ABS: 1.8 10*3/uL (ref 0.7–3.1)
LYMPHS: 34 %
MCH: 26.9 pg (ref 26.6–33.0)
MCHC: 33.7 g/dL (ref 31.5–35.7)
MCV: 80 fL (ref 79–97)
MONOS ABS: 0.3 10*3/uL (ref 0.1–0.9)
Monocytes: 6 %
NEUTROS PCT: 58 %
Neutrophils Absolute: 3 10*3/uL (ref 1.4–7.0)
Platelets: 229 10*3/uL (ref 150–450)
RBC: 4.35 x10E6/uL (ref 3.77–5.28)
RDW: 13.7 % (ref 12.3–15.4)
RH TYPE: POSITIVE
RPR Ser Ql: NONREACTIVE
Rubella Antibodies, IGG: 6.15 index (ref >0.99)
WBC: 5.3 10*3/uL (ref 3.4–10.8)

## 2017-11-06 LAB — CYSTIC FIBROSIS GENE TEST

## 2017-11-06 LAB — HEMOGLOBINOPATHY EVALUATION
FERRITIN: 59 ng/mL (ref 15–150)
HGB A: 62.7 % — AB (ref 96.4–98.8)
HGB F QUANT: 0 % (ref 0.0–2.0)
HGB SOLUBILITY: POSITIVE — AB
Hgb A2 Quant: 3 % (ref 1.8–3.2)
Hgb C: 0 %
Hgb S: 34.3 % — ABNORMAL HIGH
Hgb Variant: 0 %

## 2017-11-06 LAB — AB SCR+ANTIBODY ID: ANTIBODY SCREEN: POSITIVE — AB

## 2017-11-06 LAB — SMN1 COPY NUMBER ANALYSIS (SMA CARRIER SCREENING)

## 2017-11-09 ENCOUNTER — Inpatient Hospital Stay (HOSPITAL_COMMUNITY)
Admission: AD | Admit: 2017-11-09 | Discharge: 2017-11-09 | Disposition: A | Discharge status: Home / Self Care | Payer: BLUE CROSS/BLUE SHIELD | Source: Ambulatory Visit | Attending: Obstetrics & Gynecology | Admitting: Obstetrics & Gynecology

## 2017-11-09 ENCOUNTER — Encounter: Payer: Self-pay | Admitting: Student

## 2017-11-09 DIAGNOSIS — Z8249 Family history of ischemic heart disease and other diseases of the circulatory system: Secondary | ICD-10-CM | POA: Insufficient documentation

## 2017-11-09 DIAGNOSIS — B9689 Other specified bacterial agents as the cause of diseases classified elsewhere: Secondary | ICD-10-CM

## 2017-11-09 DIAGNOSIS — Z87891 Personal history of nicotine dependence: Secondary | ICD-10-CM | POA: Diagnosis not present

## 2017-11-09 DIAGNOSIS — Z888 Allergy status to other drugs, medicaments and biological substances status: Secondary | ICD-10-CM | POA: Diagnosis not present

## 2017-11-09 DIAGNOSIS — B373 Candidiasis of vulva and vagina: Secondary | ICD-10-CM | POA: Diagnosis not present

## 2017-11-09 DIAGNOSIS — N898 Other specified noninflammatory disorders of vagina: Secondary | ICD-10-CM

## 2017-11-09 DIAGNOSIS — O99612 Diseases of the digestive system complicating pregnancy, second trimester: Secondary | ICD-10-CM | POA: Insufficient documentation

## 2017-11-09 DIAGNOSIS — R109 Unspecified abdominal pain: Secondary | ICD-10-CM

## 2017-11-09 DIAGNOSIS — Z7982 Long term (current) use of aspirin: Secondary | ICD-10-CM | POA: Diagnosis not present

## 2017-11-09 DIAGNOSIS — Z9889 Other specified postprocedural states: Secondary | ICD-10-CM | POA: Insufficient documentation

## 2017-11-09 DIAGNOSIS — K5900 Constipation, unspecified: Secondary | ICD-10-CM | POA: Diagnosis not present

## 2017-11-09 DIAGNOSIS — Z3A16 16 weeks gestation of pregnancy: Secondary | ICD-10-CM | POA: Diagnosis not present

## 2017-11-09 DIAGNOSIS — O26892 Other specified pregnancy related conditions, second trimester: Secondary | ICD-10-CM | POA: Diagnosis not present

## 2017-11-09 DIAGNOSIS — Z880 Allergy status to penicillin: Secondary | ICD-10-CM | POA: Insufficient documentation

## 2017-11-09 DIAGNOSIS — O98812 Other maternal infectious and parasitic diseases complicating pregnancy, second trimester: Secondary | ICD-10-CM | POA: Diagnosis not present

## 2017-11-09 DIAGNOSIS — N760 Acute vaginitis: Secondary | ICD-10-CM

## 2017-11-09 DIAGNOSIS — B3731 Acute candidiasis of vulva and vagina: Secondary | ICD-10-CM

## 2017-11-09 DIAGNOSIS — Z885 Allergy status to narcotic agent status: Secondary | ICD-10-CM | POA: Diagnosis not present

## 2017-11-09 LAB — WET PREP, GENITAL
Sperm: NONE SEEN
TRICH WET PREP: NONE SEEN

## 2017-11-09 LAB — URINALYSIS, ROUTINE W REFLEX MICROSCOPIC
BILIRUBIN URINE: NEGATIVE
Glucose, UA: NEGATIVE mg/dL
HGB URINE DIPSTICK: NEGATIVE
KETONES UR: NEGATIVE mg/dL
NITRITE: NEGATIVE
Protein, ur: NEGATIVE mg/dL
Specific Gravity, Urine: 1.014 (ref 1.005–1.030)
pH: 6 (ref 5.0–8.0)

## 2017-11-09 MED ORDER — METRONIDAZOLE 500 MG PO TABS
500.0000 mg | ORAL_TABLET | Freq: Two times a day (BID) | ORAL | 0 refills | Status: DC
Start: 2017-11-09 — End: 2017-11-12

## 2017-11-09 MED ORDER — TERCONAZOLE 0.8 % VA CREA: 1.0000 | TOPICAL_CREAM | Freq: Every day | VAGINAL | 0 refills | Status: DC

## 2017-11-09 MED ORDER — DOCUSATE SODIUM 100 MG PO CAPS: 100.0000 mg | ORAL_CAPSULE | Freq: Two times a day (BID) | ORAL | 0 refills | Status: DC

## 2017-11-09 NOTE — MAU Provider Note (Signed)
History     CSN: [id number]  Arrival date and time: 11/09/17 1538   First Provider Initiated Contact with Patient 11/09/17 1612      Chief Complaint  Patient presents with  . Abdominal Pain  . Vaginal Discharge   HPI  Damary TINEA NOBILE is a 37 y.o. G3T5176 at [redacted]w[redacted]d who presents with abdominal pain and vaginal discharge. Reports suprapubic constant cramping for the last 2-3 days. Tried taking tylenol without relief. Last BM was 2 days ago; describes as 2 small hard balls. States she was supposed to be prescribed something for constipation by her OB but it wasn't at her pharmacy; hasn't been treating constipation otherwise. Denies fever/chills, n/v, dysuria, or vaginal bleeding.  Also complains of vaginal discharge and irritation. Symptoms began 4 days ago. Describes as clear thin discharge. Has external burning and irritation. Has not used OTC treatments. No new sexual contacts.   OB History    Gravida  4   Para  2   Term  2   Preterm  0   AB  1   Living  2     SAB  0   TAB  0   Ectopic  1   Multiple  0   Live Births  2           Past Medical History:  Diagnosis Date  . Arthritis     Past Surgical History:  Procedure Laterality Date  . CESAREAN SECTION    . CESAREAN SECTION      Family History  Problem Relation Age of Onset  . Hyperlipidemia Mother     Social History   Tobacco Use  . Smoking status: Former Smoker    Types: Cigarettes    Last attempt to quit: 07/2017    Years since quitting: 0.3  . Smokeless tobacco: Never Used  . Tobacco comment: quit 08/18/2017  Substance Use Topics  . Alcohol use: No  . Drug use: No    Allergies:  Allergies  Allergen Reactions  . Red Dye Swelling    Throat swells  . Meloxicam Hives and Itching  . Penicillins Other (See Comments)    Yeast infection Has patient had a PCN reaction causing immediate rash, facial/tongue/throat swelling, SOB or lightheadedness with hypotension: no Has patient had a PCN  reaction causing severe rash involving mucus membranes or skin necrosis: no Has patient had a PCN reaction that required hospitalization: no Has patient had a PCN reaction occurring within the last 10 years: yes If all of the above answers are "NO", then may proceed with Cephalosporin use.   Marland Kitchen Morphine And Related Hives    Medications Prior to Admission  Medication Sig Dispense Refill Last Dose  . aspirin EC 81 MG tablet Take 1 tablet (81 mg total) by mouth daily. Take after 12 weeks for prevention of preeclampsia later in pregnancy 300 tablet 2 11/09/2017 at Unknown time  . predniSONE (DELTASONE) 5 MG tablet Take by mouth daily as needed.  1     Review of Systems  Constitutional: Negative.   Gastrointestinal: Positive for abdominal pain and constipation. Negative for blood in stool, diarrhea, nausea and vomiting.  Genitourinary: Positive for vaginal discharge. Negative for dysuria and vaginal bleeding.       + vaginal burning   Physical Exam   Blood pressure 110/68, pulse (!) 104, temperature 98.4 F (36.9 C), temperature source Oral, resp. rate 19, weight 219 lb 6 oz (99.5 kg), last menstrual period 07/15/2017, SpO2 96 %, unknown  if currently breastfeeding.  Physical Exam  Nursing note and vitals reviewed. Constitutional: She is oriented to person, place, and time. She appears well-developed and well-nourished. No distress.  HENT:  Head: Normocephalic and atraumatic.  Eyes: Conjunctivae are normal. Right eye exhibits no discharge. Left eye exhibits no discharge. No scleral icterus.  Neck: Normal range of motion.  Respiratory: Effort normal. No respiratory distress.  GI: Soft. There is no tenderness.  Genitourinary: Cervix exhibits friability. Cervix exhibits no motion tenderness. There is erythema in the vagina. No bleeding in the vagina. Vaginal discharge found.  Genitourinary Comments: Small/mod amt of white frothy discharge.  Cervix closed/thick  Neurological: She is alert and  oriented to person, place, and time.  Skin: Skin is warm and dry. She is not diaphoretic.  Psychiatric: She has a normal mood and affect. Her behavior is normal. Judgment and thought content normal.    MAU Course  Procedures Results for orders placed or performed during the hospital encounter of 11/09/17 (from the past 24 hour(s))  Urinalysis, Routine w reflex microscopic     Status: Abnormal   Collection Time: 11/09/17  3:43 PM  Result Value Ref Range   Color, Urine YELLOW YELLOW   APPearance CLEAR CLEAR   Specific Gravity, Urine 1.014 1.005 - 1.030   pH 6.0 5.0 - 8.0   Glucose, UA NEGATIVE NEGATIVE mg/dL   Hgb urine dipstick NEGATIVE NEGATIVE   Bilirubin Urine NEGATIVE NEGATIVE   Ketones, ur NEGATIVE NEGATIVE mg/dL   Protein, ur NEGATIVE NEGATIVE mg/dL   Nitrite NEGATIVE NEGATIVE   Leukocytes, UA MODERATE (A) NEGATIVE   RBC / HPF 0-5 0 - 5 RBC/hpf   WBC, UA 6-10 0 - 5 WBC/hpf   Bacteria, UA RARE (A) NONE SEEN   Squamous Epithelial / LPF 0-5 0 - 5  Wet prep, genital     Status: Abnormal   Collection Time: 11/09/17  4:23 PM  Result Value Ref Range   Yeast Wet Prep HPF POC PRESENT (A) NONE SEEN   Trich, Wet Prep NONE SEEN NONE SEEN   Clue Cells Wet Prep HPF POC PRESENT (A) NONE SEEN   WBC, Wet Prep HPF POC MANY (A) NONE SEEN   Sperm NONE SEEN     MDM FHT 140 by doppler  GC/CT & wet prep collected  Cervix closed  Assessment and Plan  A: 1. Vaginal yeast infection   2. BV (bacterial vaginosis)   3. [redacted] weeks gestation of pregnancy   4. Constipation during pregnancy in second trimester    P: Discharge home Rx flagyl, terazol, & colace Discussed reasons to return to MAU Keep follow up appointment with OB/PCP  GC/Ct pending   Judeth Horn 11/09/2017, 4:12 PM

## 2017-11-09 NOTE — Discharge Instructions (Signed)
Constipation, Adult Constipation is when a person has fewer bowel movements in a week than normal, has difficulty having a bowel movement, or has stools that are dry, hard, or larger than normal. Constipation may be caused by an underlying condition. It may become worse with age if a person takes certain medicines and does not take in enough fluids. Follow these instructions at home: Eating and drinking   Eat foods that have a lot of fiber, such as fresh fruits and vegetables, whole grains, and beans.  Limit foods that are high in fat, low in fiber, or overly processed, such as french fries, hamburgers, cookies, candies, and soda.  Drink enough fluid to keep your urine clear or pale yellow. General instructions  Exercise regularly or as told by your health care provider.  Go to the restroom when you have the urge to go. Do not hold it in.  Take over-the-counter and prescription medicines only as told by your health care provider. These include any fiber supplements.  Practice pelvic floor retraining exercises, such as deep breathing while relaxing the lower abdomen and pelvic floor relaxation during bowel movements.  Watch your condition for any changes.  Keep all follow-up visits as told by your health care provider. This is important. Contact a health care provider if:  You have pain that gets worse.  You have a fever.  You do not have a bowel movement after 4 days.  You vomit.  You are not hungry.  You lose weight.  You are bleeding from the anus.  You have thin, pencil-like stools. Get help right away if:  You have a fever and your symptoms suddenly get worse.  You leak stool or have blood in your stool.  Your abdomen is bloated.  You have severe pain in your abdomen.  You feel dizzy or you faint. This information is not intended to replace advice given to you by your health care provider. Make sure you discuss any questions you have with your health care  provider. Document Released: 02/22/2004 Document Revised: 12/14/2015 Document Reviewed: 11/14/2015 Elsevier Interactive Patient Education  2018 [employer].     Bacterial Vaginosis Bacterial vaginosis is a vaginal infection that occurs when the normal balance of bacteria in the vagina is disrupted. It results from an overgrowth of certain bacteria. This is the most common vaginal infection among women ages 55-44. Because bacterial vaginosis increases your risk for STIs (sexually transmitted infections), getting treated can help reduce your risk for chlamydia, gonorrhea, herpes, and HIV (human immunodeficiency virus). Treatment is also important for preventing complications in pregnant women, because this condition can cause an early (premature) delivery. What are the causes? This condition is caused by an increase in harmful bacteria that are normally present in small amounts in the vagina. However, the reason that the condition develops is not fully understood. What increases the risk? The following factors may make you more likely to develop this condition:  Having a new sexual partner or multiple sexual partners.  Having unprotected sex.  Douching.  Having an intrauterine device (IUD).  Smoking.  Drug and alcohol abuse.  Taking certain antibiotic medicines.  Being pregnant.  You cannot get bacterial vaginosis from toilet seats, bedding, swimming pools, or contact with objects around you. What are the signs or symptoms? Symptoms of this condition include:  Grey or white vaginal discharge. The discharge can also be watery or foamy.  A fish-like odor with discharge, especially after sexual intercourse or during menstruation.  Itching in  and around the vagina.  Burning or pain with urination.  Some women with bacterial vaginosis have no signs or symptoms. How is this diagnosed? This condition is diagnosed based on:  Your medical history.  A physical exam of the  vagina.  Testing a sample of vaginal fluid under a microscope to look for a large amount of bad bacteria or abnormal cells. Your health care provider may use a cotton swab or a small wooden spatula to collect the sample.  How is this treated? This condition is treated with antibiotics. These may be given as a pill, a vaginal cream, or a medicine that is put into the vagina (suppository). If the condition comes back after treatment, a second round of antibiotics may be needed. Follow these instructions at home: Medicines  Take over-the-counter and prescription medicines only as told by your health care provider.  Take or use your antibiotic as told by your health care provider. Do not stop taking or using the antibiotic even if you start to feel better. General instructions  If you have a female sexual partner, tell her that you have a vaginal infection. She should see her health care provider and be treated if she has symptoms. If you have a female sexual partner, he does not need treatment.  During treatment: ? Avoid sexual activity until you finish treatment. ? Do not douche. ? Avoid alcohol as directed by your health care provider. ? Avoid breastfeeding as directed by your health care provider.  Drink enough water and fluids to keep your urine clear or pale yellow.  Keep the area around your vagina and rectum clean. ? Wash the area daily with warm water. ? Wipe yourself from front to back after using the toilet.  Keep all follow-up visits as told by your health care provider. This is important. How is this prevented?  Do not douche.  Wash the outside of your vagina with warm water only.  Use protection when having sex. This includes latex condoms and dental dams.  Limit how many sexual partners you have. To help prevent bacterial vaginosis, it is best to have sex with just one partner (monogamous).  Make sure you and your sexual partner are tested for STIs.  Wear cotton or  cotton-lined underwear.  Avoid wearing tight pants and pantyhose, especially during summer.  Limit the amount of alcohol that you drink.  Do not use any products that contain nicotine or tobacco, such as cigarettes and e-cigarettes. If you need help quitting, ask your health care provider.  Do not use illegal drugs. Where to find more information:  Centers for Disease Control and Prevention: SolutionApps.co.za  American Sexual Health Association (ASHA): www.ashastd.org  U.S. Department of Health and Health and safety inspector, Office on Women's Health: ConventionalMedicines.si or http://www.anderson-williamson.info/ Contact a health care provider if:  Your symptoms do not improve, even after treatment.  You have more discharge or pain when urinating.  You have a fever.  You have pain in your abdomen.  You have pain during sex.  You have vaginal bleeding between periods. Summary  Bacterial vaginosis is a vaginal infection that occurs when the normal balance of bacteria in the vagina is disrupted.  Because bacterial vaginosis increases your risk for STIs (sexually transmitted infections), getting treated can help reduce your risk for chlamydia, gonorrhea, herpes, and HIV (human immunodeficiency virus). Treatment is also important for preventing complications in pregnant women, because the condition can cause an early (premature) delivery.  This condition is treated with antibiotic medicines.  These may be given as a pill, a vaginal cream, or a medicine that is put into the vagina (suppository). This information is not intended to replace advice given to you by your health care provider. Make sure you discuss any questions you have with your health care provider. Document Released: 05/26/2005 Document Revised: 09/29/2016 Document Reviewed: 02/09/2016 Elsevier Interactive Patient Education  [employer].

## 2017-11-09 NOTE — MAU Note (Signed)
Patient presents to MAU with lower abdominal pain that comes and goes as well as vaginal irritation and discharge for past 4 days.  Pt denies new sexual partner.

## 2017-11-10 LAB — GC/CHLAMYDIA PROBE AMP (~~LOC~~) NOT AT ARMC
Chlamydia: NEGATIVE
NEISSERIA GONORRHEA: NEGATIVE

## 2017-11-11 ENCOUNTER — Encounter: Payer: Self-pay | Admitting: Student

## 2017-11-11 DIAGNOSIS — Z880 Allergy status to penicillin: Secondary | ICD-10-CM | POA: Insufficient documentation

## 2017-11-11 DIAGNOSIS — R8271 Bacteriuria: Secondary | ICD-10-CM | POA: Insufficient documentation

## 2017-11-11 LAB — CULTURE, OB URINE: Culture: 10000 — AB

## 2017-11-12 ENCOUNTER — Other Ambulatory Visit: Payer: Self-pay | Admitting: Medical

## 2017-11-12 DIAGNOSIS — B9689 Other specified bacterial agents as the cause of diseases classified elsewhere: Secondary | ICD-10-CM

## 2017-11-12 DIAGNOSIS — N760 Acute vaginitis: Principal | ICD-10-CM

## 2017-11-12 MED ORDER — METRONIDAZOLE 500 MG PO TABS: 500.0000 mg | ORAL_TABLET | Freq: Two times a day (BID) | ORAL | 0 refills | Status: DC

## 2017-11-13 ENCOUNTER — Encounter: Payer: Self-pay | Admitting: Obstetrics and Gynecology

## 2017-11-13 ENCOUNTER — Encounter: Payer: Self-pay | Admitting: *Deleted

## 2017-11-13 DIAGNOSIS — D573 Sickle-cell trait: Secondary | ICD-10-CM | POA: Insufficient documentation

## 2017-11-25 ENCOUNTER — Encounter: Payer: BLUE CROSS/BLUE SHIELD | Admitting: Obstetrics and Gynecology

## 2017-11-27 ENCOUNTER — Ambulatory Visit (INDEPENDENT_AMBULATORY_CARE_PROVIDER_SITE_OTHER): Payer: BLUE CROSS/BLUE SHIELD | Admitting: Medical

## 2017-11-27 VITALS — BP 126/71 | HR 108 | Wt 216.1 lb

## 2017-11-27 DIAGNOSIS — R8271 Bacteriuria: Secondary | ICD-10-CM

## 2017-11-27 DIAGNOSIS — O09522 Supervision of elderly multigravida, second trimester: Secondary | ICD-10-CM

## 2017-11-27 DIAGNOSIS — Z98891 History of uterine scar from previous surgery: Secondary | ICD-10-CM

## 2017-11-27 DIAGNOSIS — D573 Sickle-cell trait: Secondary | ICD-10-CM

## 2017-11-27 DIAGNOSIS — M069 Rheumatoid arthritis, unspecified: Secondary | ICD-10-CM

## 2017-11-27 DIAGNOSIS — O0991 Supervision of high risk pregnancy, unspecified, first trimester: Secondary | ICD-10-CM

## 2017-11-27 NOTE — Patient Instructions (Signed)
Second Trimester of Pregnancy The second trimester is from week 13 through week 28, month 4 through 6. This is often the time in pregnancy that you feel your best. Often times, morning sickness has lessened or quit. You may have more energy, and you may get hungry more often. Your unborn baby (fetus) is growing rapidly. At the end of the sixth month, he or she is about 9 inches long and weighs about 1 pounds. You will likely feel the baby move (quickening) between 18 and 20 weeks of pregnancy.  Research childbirth classes and hospital preregistration at [email or web address]  Follow these instructions at home:  Avoid all smoking, herbs, and alcohol. Avoid drugs not approved by your doctor.  Do not use any tobacco products, including cigarettes, chewing tobacco, and electronic cigarettes. If you need help quitting, ask your doctor. You may get counseling or other support to help you quit.  Only take medicine as told by your doctor. Some medicines are safe and some are not during pregnancy.  Exercise only as told by your doctor. Stop exercising if you start having cramps.  Eat regular, healthy meals.  Wear a good support bra if your breasts are tender.  Do not use hot tubs, steam rooms, or saunas.  Wear your seat belt when driving.  Avoid raw meat, uncooked cheese, and liter boxes and soil used by cats.  Take your prenatal vitamins.  Take 1500-2000 milligrams of calcium daily starting at the 20th week of pregnancy until you deliver your baby.  Try taking medicine that helps you poop (stool softener) as needed, and if your doctor approves. Eat more fiber by eating fresh fruit, vegetables, and whole grains. Drink enough fluids to keep your pee (urine) clear or pale yellow.  Take warm water baths (sitz baths) to soothe pain or discomfort caused by hemorrhoids. Use hemorrhoid cream if your doctor approves.  If you have puffy, bulging veins (varicose veins), wear support hose. Raise  (elevate) your feet for 15 minutes, 3-4 times a day. Limit salt in your diet.  Avoid heavy lifting, wear low heals, and sit up straight.  Rest with your legs raised if you have leg cramps or low back pain.  Visit your dentist if you have not gone during your pregnancy. Use a soft toothbrush to brush your teeth. Be gentle when you floss.  You can have sex (intercourse) unless your doctor tells you not to.  Go to your doctor visits.  Get help if:  You feel dizzy.  You have mild cramps or pressure in your lower belly (abdomen).  You have a nagging pain in your belly area.  You continue to feel sick to your stomach (nauseous), throw up (vomit), or have watery poop (diarrhea).  You have bad smelling fluid coming from your vagina.  You have pain with peeing (urination). Get help right away if:  You have a fever.  You are leaking fluid from your vagina.  You have spotting or bleeding from your vagina.  You have severe belly cramping or pain.  You lose or gain weight rapidly.  You have trouble catching your breath and have chest pain.  You notice sudden or extreme puffiness (swelling) of your face, hands, ankles, feet, or legs.  You have not felt the baby move in over an hour.  You have severe headaches that do not go away with medicine.  You have vision changes. This information is not intended to replace advice given to you by your health care provider. Make   sure you discuss any questions you have with your health care provider. Document Released: 08/20/2009 Document Revised: 11/01/2015 Document Reviewed: 07/27/2012 Elsevier Interactive Patient Education  2017 Elsevier Inc.    

## 2017-11-27 NOTE — Progress Notes (Signed)
   PRENATAL VISIT NOTE  Subjective:  Catherine Underwood is a 37 y.o. K5L9767 at [redacted]w[redacted]d being seen today for ongoing prenatal care.  She is currently monitored for the following issues for this high-risk pregnancy and has Preventative health care; Supervision of high risk pregnancy, antepartum, first trimester; Rheumatoid arthritis (HCC); History of cesarean section; Unwanted fertility; GBS bacteriuria; Penicillin allergy; and Sickle cell trait (HCC) on their problem list.  Patient reports vaginal irritation and recent RA flare.  Contractions: Not present. Vag. Bleeding: None.  Movement: Present. Denies leaking of fluid.   The following portions of the patient's history were reviewed and updated as appropriate: allergies, current medications, past family history, past medical history, past social history, past surgical history and problem list. Problem list updated.  Objective:   Vitals:   11/27/17 1122  BP: 126/71  Pulse: (!) 108  Weight: 216 lb 1.6 oz (98 kg)    Fetal Status: Fetal Heart Rate (bpm): 146   Movement: Present     General:  Alert, oriented and cooperative. Patient is in no acute distress.  Skin: Skin is warm and dry. No rash noted.   Cardiovascular: Normal heart rate noted  Respiratory: Normal respiratory effort, no problems with respiration noted  Abdomen: Soft, gravid, appropriate for gestational age.  Pain/Pressure: Present     Pelvic: Cervical exam deferred        Extremities: Normal range of motion.  Edema: None  Mental Status: Normal mood and affect. Normal behavior. Normal judgment and thought content.   Assessment and Plan:  Pregnancy: H4L9379 at [redacted]w[redacted]d  1. Supervision of high risk pregnancy, antepartum, first trimester  2. GBS bacteriuria - Will need treatment in labor - PCN allergic - need sensitivities prior to delivery   3. History of cesarean section - Plan to discuss RCS vs TOLAC at 28 weeks with MD  4. Rheumatoid arthritis, involving unspecified  site, unspecified rheumatoid factor presence (HCC) - Restarted Embrel and taking Prednisone for flares - Per patient, Rheumatology wants to consider Cimza, advised patient to continue Embrel for now and consult with MD at next visit  5. Sickle cell trait (HCC) - Per patient, FOB is not a carrier   6. AMA (advanced maternal age) multigravida 35+, second trimester  Preterm labor symptoms and general obstetric precautions including but not limited to vaginal bleeding, contractions, leaking of fluid and fetal movement were reviewed in detail with the patient. Please refer to After Visit Summary for other counseling recommendations.  Return in about 1 month (around 12/25/2017) for [facility].  Future Appointments  Date Time Provider Department Center  12/03/2017  3:15 PM [facility] [place] 4 [facility]  12/30/2017  8:55 AM Conan Bowens, MD [facility]    Vonzella Nipple, PA-C

## 2017-11-27 NOTE — Progress Notes (Signed)
   PRENATAL VISIT NOTE  Subjective:  Catherine Underwood is a 37 y.o. [id number] at [redacted]w[redacted]d being seen today for ongoing prenatal care.  She is currently monitored for the following issues for this high-risk pregnancy and has Preventative health care; Supervision of high risk pregnancy, antepartum, first trimester; Rheumatoid arthritis (HCC); History of cesarean section; Unwanted fertility; GBS bacteriuria; Penicillin allergy; and Sickle cell trait (HCC) on their problem list.  Patient reports occasional inguinal discomfort, but "nothing she is worried about". She informed by staff that is was likely ligament pain. Pt. Also reports that her Sx from prevoius dx on 11/12/17 of BV has not resolved. Pt. States that OTC monistat 7 was cheaper than prescription, so she wanted to try that first. Pt. Now agrees to fill tioconazole first before any other regimens. Vag. Bleeding: None.  Movement: Present. Pt. Denies leaking of fluid, blood, flank pain, hematuria, dysuria or fevers. Pt. Inquired about starting new rheumatic medication Cimza. Pt. Was informed that we will schedule f/u visit with Dr. For further inquiry. Recommend to continue using her Embryl as directed as its OB hx s/e and profile is better studied and understood.  The following portions of the patient's history were reviewed and updated as appropriate: allergies, current medications, past family history, past medical history, past social history, past surgical history and problem list. Problem list updated.  Objective:   Vitals:   11/27/17 1122  BP: 126/71  Pulse: (!) 108  Weight: 216 lb 1.6 oz (98 kg)    Fetal Status: Fetal Heart Rate (bpm): 146   Movement: Present     General:  Alert, oriented and cooperative. Patient is in no acute distress.  Skin: Skin is warm and dry. No rash noted.   Cardiovascular: Normal heart rate noted  Respiratory: Normal respiratory effort, no problems with respiration noted  Abdomen: Soft, gravid, appropriate for  gestational age.  Pain/Pressure: Present     Pelvic: Cervical exam deferred        Extremities: Normal range of motion.  Edema: None  Mental Status: Normal mood and affect. Normal behavior. Normal judgment and thought content.   Assessment and Plan:  Pregnancy: [id number] at [redacted]w[redacted]d  1. Supervision of high risk pregnancy, antepartum, first trimester Rheumatoid Arthritis  Preterm labor symptoms and general obstetric precautions including but not limited to vaginal bleeding, contractions, leaking of fluid and fetal movement were reviewed in detail with the patient. Please refer to After Visit Summary for other counseling recommendations.  Return in about 1 month (around 12/25/2017) for [facility].  Future Appointments  Date Time Provider Department Center  12/03/2017  3:15 PM [facility] [place] 4 [facility]  12/30/2017  8:55 AM Conan Bowens, MD [facility]    Cayne Yom Winona Legato, RN, FNP (student)

## 2017-11-27 NOTE — Progress Notes (Signed)
States took [employer] instead of terazole and it didn't help, still having vaginal itching. Instructed to pick up terazole and use for at least 3 nights.

## 2017-12-03 ENCOUNTER — Encounter (HOSPITAL_COMMUNITY): Payer: Self-pay

## 2017-12-03 ENCOUNTER — Ambulatory Visit (HOSPITAL_COMMUNITY)
Admission: RE | Admit: 2017-12-03 | Discharge: 2017-12-03 | Disposition: A | Discharge status: Home / Self Care | Payer: BLUE CROSS/BLUE SHIELD | Source: Ambulatory Visit | Attending: Obstetrics and Gynecology | Admitting: Obstetrics and Gynecology

## 2017-12-03 ENCOUNTER — Other Ambulatory Visit: Payer: Self-pay | Admitting: Obstetrics and Gynecology

## 2017-12-03 DIAGNOSIS — O99212 Obesity complicating pregnancy, second trimester: Secondary | ICD-10-CM | POA: Insufficient documentation

## 2017-12-03 DIAGNOSIS — O09522 Supervision of elderly multigravida, second trimester: Secondary | ICD-10-CM

## 2017-12-03 DIAGNOSIS — Z3A20 20 weeks gestation of pregnancy: Secondary | ICD-10-CM

## 2017-12-03 DIAGNOSIS — M069 Rheumatoid arthritis, unspecified: Secondary | ICD-10-CM

## 2017-12-03 DIAGNOSIS — O0991 Supervision of high risk pregnancy, unspecified, first trimester: Secondary | ICD-10-CM

## 2017-12-03 DIAGNOSIS — Z862 Personal history of diseases of the blood and blood-forming organs and certain disorders involving the immune mechanism: Secondary | ICD-10-CM | POA: Diagnosis not present

## 2017-12-03 DIAGNOSIS — R8271 Bacteriuria: Secondary | ICD-10-CM

## 2017-12-03 DIAGNOSIS — D573 Sickle-cell trait: Secondary | ICD-10-CM

## 2017-12-03 DIAGNOSIS — Z98891 History of uterine scar from previous surgery: Secondary | ICD-10-CM

## 2017-12-03 DIAGNOSIS — Z880 Allergy status to penicillin: Secondary | ICD-10-CM

## 2017-12-03 DIAGNOSIS — O34219 Maternal care for unspecified type scar from previous cesarean delivery: Secondary | ICD-10-CM | POA: Diagnosis not present

## 2017-12-03 NOTE — Addendum Note (Signed)
Encounter addended by: Levonne Hubert, RDMS, RVT on: 12/03/2017 4:47 PM  Actions taken: Imaging Exam ended

## 2017-12-04 ENCOUNTER — Telehealth: Payer: Self-pay

## 2017-12-04 ENCOUNTER — Other Ambulatory Visit (HOSPITAL_COMMUNITY): Payer: Self-pay | Admitting: *Deleted

## 2017-12-04 DIAGNOSIS — Z362 Encounter for other antenatal screening follow-up: Secondary | ICD-10-CM

## 2017-12-04 NOTE — Telephone Encounter (Signed)
-----   Message from Hermina Staggers, MD sent at 12/04/2017  8:38 AM EDT ----- Please schedule follow up  U/S in 4 weeks to complete anatomy. Thanks Casimiro Needle

## 2017-12-04 NOTE — Telephone Encounter (Signed)
Follow up ultrasound scheduled.  Left message informing patient.

## 2017-12-29 ENCOUNTER — Encounter (HOSPITAL_COMMUNITY): Payer: Self-pay

## 2017-12-30 ENCOUNTER — Ambulatory Visit (INDEPENDENT_AMBULATORY_CARE_PROVIDER_SITE_OTHER): Payer: BLUE CROSS/BLUE SHIELD | Admitting: Obstetrics and Gynecology

## 2017-12-30 ENCOUNTER — Encounter: Payer: Self-pay | Admitting: Obstetrics and Gynecology

## 2017-12-30 VITALS — BP 109/61 | HR 116 | Wt 220.6 lb

## 2017-12-30 DIAGNOSIS — N898 Other specified noninflammatory disorders of vagina: Secondary | ICD-10-CM

## 2017-12-30 DIAGNOSIS — R8271 Bacteriuria: Secondary | ICD-10-CM

## 2017-12-30 DIAGNOSIS — Z98891 History of uterine scar from previous surgery: Secondary | ICD-10-CM

## 2017-12-30 DIAGNOSIS — D573 Sickle-cell trait: Secondary | ICD-10-CM

## 2017-12-30 DIAGNOSIS — O0991 Supervision of high risk pregnancy, unspecified, first trimester: Secondary | ICD-10-CM

## 2017-12-30 DIAGNOSIS — M069 Rheumatoid arthritis, unspecified: Secondary | ICD-10-CM

## 2017-12-30 DIAGNOSIS — Z3009 Encounter for other general counseling and advice on contraception: Secondary | ICD-10-CM

## 2017-12-30 MED ORDER — CLOTRIMAZOLE 1 % VA CREA: 1.0000 | TOPICAL_CREAM | Freq: Every day | VAGINAL | 1 refills | Status: DC

## 2017-12-30 NOTE — Progress Notes (Signed)
   PRENATAL VISIT NOTE  Subjective:  Catherine Underwood is a 37 y.o. [id number] at [redacted]w[redacted]d being seen today for ongoing prenatal care.  She is currently monitored for the following issues for this high-risk pregnancy and has Preventative health care; Supervision of high risk pregnancy, antepartum, first trimester; Rheumatoid arthritis (HCC); History of cesarean section; Unwanted fertility; GBS bacteriuria; Penicillin allergy; and Sickle cell trait (HCC) on their problem list.  Patient reports ongoing vaginal itching, has had positive   Contractions: Not present. Vag. Bleeding: None.  Movement: Present. Denies leaking of fluid.   The following portions of the patient's history were reviewed and updated as appropriate: allergies, current medications, past family history, past medical history, past social history, past surgical history and problem list. Problem list updated.  Objective:   Vitals:   12/30/17 0918  BP: 109/61  Pulse: (!) 116  Weight: 220 lb 9.6 oz (100.1 kg)    Fetal Status: Fetal Heart Rate (bpm): 148   Movement: Present     General:  Alert, oriented and cooperative. Patient is in no acute distress.  Skin: Skin is warm and dry. No rash noted.   Cardiovascular: Normal heart rate noted  Respiratory: Normal respiratory effort, no problems with respiration noted  Abdomen: Soft, gravid, appropriate for gestational age.  Pain/Pressure: Present     Pelvic: Cervical exam deferred      moderate amount white discharge in vagina  Extremities: Normal range of motion.  Edema: None  Mental Status: Normal mood and affect. Normal behavior. Normal judgment and thought content.   Assessment and Plan:  Pregnancy: [id number] at [redacted]w[redacted]d  1. Supervision of high risk pregnancy, antepartum, first trimester  2. History of cesarean section Reviewed risks/benefits of TOLAC versus RCS in detail. Patient counseled regarding potential vaginal delivery, chance of success, future implications, possible  uterine rupture and need for urgent/emergent repeat cesarean. Counseled regarding potential need for repeat c-section for reasons unrelated to first c-section. Counseled regarding scheduled repeat cesarean including risks of bleeding, infection, damage to surrounding tissue, abnormal placentation, implications for future pregnancies. All questions answered.    3. Unwanted fertility Undecided about BTL  4. GBS bacteriuria ppx in labor  5. Sickle cell trait (HCC) Urine culture Q trimester  6. Rheumatoid arthritis, involving unspecified site, unspecified rheumatoid factor presence (HCC) on enbrel, prednisone (using once/month for flares)  8. Vaginal itching - Cervicovaginal ancillary only - appearance of yeast, will sent clotrimazole   Preterm labor symptoms and general obstetric precautions including but not limited to vaginal bleeding, contractions, leaking of fluid and fetal movement were reviewed in detail with the patient. Please refer to After Visit Summary for other counseling recommendations.  Return in about 1 month (around 01/27/2018) for OB visit (MD), 2 hr GTT, 3rd trim labs.  Future Appointments  Date Time Provider Department Center  01/01/2018  3:30 PM [facility] [place] 1 [facility]    Conan Bowens, MD

## 2017-12-30 NOTE — Progress Notes (Signed)
Pt states that she is still having vaginal itching with no d/c nor odor. Did do the Tx for the yeast infection. Did clear but after sex the itching seems to come back.

## 2017-12-31 ENCOUNTER — Ambulatory Visit (HOSPITAL_COMMUNITY)
Admission: RE | Admit: 2017-12-31 | Discharge: 2017-12-31 | Disposition: A | Discharge status: Home / Self Care | Payer: BLUE CROSS/BLUE SHIELD | Source: Ambulatory Visit | Attending: Obstetrics and Gynecology | Admitting: Obstetrics and Gynecology

## 2017-12-31 LAB — CERVICOVAGINAL ANCILLARY ONLY
Bacterial vaginitis: POSITIVE — AB
CANDIDA VAGINITIS: POSITIVE — AB

## 2017-12-31 MED ORDER — METRONIDAZOLE 500 MG PO TABS: 500.0000 mg | ORAL_TABLET | Freq: Two times a day (BID) | ORAL | 0 refills | Status: DC

## 2017-12-31 NOTE — Addendum Note (Signed)
Addended by: Leroy Libman on: 12/31/2017 04:43 PM   Modules accepted: Orders

## 2018-01-01 ENCOUNTER — Ambulatory Visit (HOSPITAL_COMMUNITY)
Admission: RE | Admit: 2018-01-01 | Discharge: 2018-01-01 | Disposition: A | Discharge status: Home / Self Care | Payer: BLUE CROSS/BLUE SHIELD | Source: Ambulatory Visit | Attending: Obstetrics and Gynecology | Admitting: Obstetrics and Gynecology

## 2018-01-01 ENCOUNTER — Telehealth: Payer: Self-pay

## 2018-01-01 ENCOUNTER — Encounter (HOSPITAL_COMMUNITY): Payer: Self-pay

## 2018-01-01 DIAGNOSIS — D573 Sickle-cell trait: Secondary | ICD-10-CM | POA: Diagnosis not present

## 2018-01-01 DIAGNOSIS — Z79899 Other long term (current) drug therapy: Secondary | ICD-10-CM | POA: Diagnosis not present

## 2018-01-01 DIAGNOSIS — E669 Obesity, unspecified: Secondary | ICD-10-CM | POA: Diagnosis not present

## 2018-01-01 DIAGNOSIS — Z362 Encounter for other antenatal screening follow-up: Secondary | ICD-10-CM | POA: Diagnosis not present

## 2018-01-01 DIAGNOSIS — Z3A24 24 weeks gestation of pregnancy: Secondary | ICD-10-CM

## 2018-01-01 DIAGNOSIS — Z3689 Encounter for other specified antenatal screening: Secondary | ICD-10-CM | POA: Insufficient documentation

## 2018-01-01 DIAGNOSIS — O34219 Maternal care for unspecified type scar from previous cesarean delivery: Secondary | ICD-10-CM | POA: Insufficient documentation

## 2018-01-01 DIAGNOSIS — Z7952 Long term (current) use of systemic steroids: Secondary | ICD-10-CM | POA: Diagnosis not present

## 2018-01-01 DIAGNOSIS — O99212 Obesity complicating pregnancy, second trimester: Secondary | ICD-10-CM

## 2018-01-01 DIAGNOSIS — O09522 Supervision of elderly multigravida, second trimester: Secondary | ICD-10-CM

## 2018-01-01 DIAGNOSIS — O9989 Other specified diseases and conditions complicating pregnancy, childbirth and the puerperium: Secondary | ICD-10-CM | POA: Insufficient documentation

## 2018-01-01 DIAGNOSIS — Z880 Allergy status to penicillin: Secondary | ICD-10-CM

## 2018-01-01 DIAGNOSIS — M069 Rheumatoid arthritis, unspecified: Secondary | ICD-10-CM | POA: Diagnosis not present

## 2018-01-01 DIAGNOSIS — R8271 Bacteriuria: Secondary | ICD-10-CM

## 2018-01-01 DIAGNOSIS — O99012 Anemia complicating pregnancy, second trimester: Secondary | ICD-10-CM | POA: Diagnosis not present

## 2018-01-01 NOTE — Telephone Encounter (Addendum)
-----   Message from Conan Bowens, MD sent at 12/31/2017  4:43 PM EDT ----- Please call patient and inform her of BV, I already sent treatment for yeast at last visit, flagyl sent to pharmacy.  Notified pt of results and tx sent to her pharmacy.  I advised pt to complete the course of Flagyl before beginning the cream for yeast infection.  Pt stated understanding with no further questions.

## 2018-01-04 ENCOUNTER — Other Ambulatory Visit (HOSPITAL_COMMUNITY): Payer: Self-pay | Admitting: *Deleted

## 2018-01-04 DIAGNOSIS — M069 Rheumatoid arthritis, unspecified: Secondary | ICD-10-CM

## 2018-01-04 DIAGNOSIS — O9989 Other specified diseases and conditions complicating pregnancy, childbirth and the puerperium: Principal | ICD-10-CM

## 2018-01-04 DIAGNOSIS — O99891 Other specified diseases and conditions complicating pregnancy: Secondary | ICD-10-CM

## 2018-01-05 ENCOUNTER — Ambulatory Visit (HOSPITAL_COMMUNITY): Payer: BLUE CROSS/BLUE SHIELD

## 2018-01-26 ENCOUNTER — Other Ambulatory Visit: Payer: Self-pay

## 2018-01-26 DIAGNOSIS — O0991 Supervision of high risk pregnancy, unspecified, first trimester: Secondary | ICD-10-CM

## 2018-01-27 ENCOUNTER — Encounter: Payer: Self-pay | Admitting: Obstetrics and Gynecology

## 2018-01-27 ENCOUNTER — Ambulatory Visit (INDEPENDENT_AMBULATORY_CARE_PROVIDER_SITE_OTHER): Payer: BLUE CROSS/BLUE SHIELD | Admitting: Obstetrics and Gynecology

## 2018-01-27 ENCOUNTER — Other Ambulatory Visit: Payer: BLUE CROSS/BLUE SHIELD

## 2018-01-27 VITALS — BP 125/82 | HR 107 | Wt 219.0 lb

## 2018-01-27 DIAGNOSIS — Z98891 History of uterine scar from previous surgery: Secondary | ICD-10-CM

## 2018-01-27 DIAGNOSIS — Z23 Encounter for immunization: Secondary | ICD-10-CM | POA: Diagnosis not present

## 2018-01-27 DIAGNOSIS — D573 Sickle-cell trait: Secondary | ICD-10-CM

## 2018-01-27 DIAGNOSIS — R8271 Bacteriuria: Secondary | ICD-10-CM

## 2018-01-27 DIAGNOSIS — M069 Rheumatoid arthritis, unspecified: Secondary | ICD-10-CM

## 2018-01-27 DIAGNOSIS — Z3009 Encounter for other general counseling and advice on contraception: Secondary | ICD-10-CM

## 2018-01-27 DIAGNOSIS — O0991 Supervision of high risk pregnancy, unspecified, first trimester: Secondary | ICD-10-CM | POA: Diagnosis not present

## 2018-01-27 NOTE — Progress Notes (Signed)
   PRENATAL VISIT NOTE  Subjective:  Catherine Underwood is a 37 y.o. [id number] at [redacted]w[redacted]d being seen today for ongoing prenatal care.  She is currently monitored for the following issues for this high-risk pregnancy and has Preventative health care; Supervision of high risk pregnancy, antepartum, first trimester; Rheumatoid arthritis (HCC); History of cesarean section; GBS bacteriuria; Penicillin allergy; and Sickle cell trait (HCC) on their problem list.  Patient reports no complaints.  Contractions: Not present. Vag. Bleeding: None.  Movement: Present. Denies leaking of fluid.   The following portions of the patient's history were reviewed and updated as appropriate: allergies, current medications, past family history, past medical history, past social history, past surgical history and problem list. Problem list updated.  Objective:   Vitals:   01/27/18 0915  BP: 125/82  Pulse: (!) 107  Weight: 219 lb (99.3 kg)    Fetal Status: Fetal Heart Rate (bpm): 143   Movement: Present     General:  Alert, oriented and cooperative. Patient is in no acute distress.  Skin: Skin is warm and dry. No rash noted.   Cardiovascular: Normal heart rate noted  Respiratory: Normal respiratory effort, no problems with respiration noted  Abdomen: Soft, gravid, appropriate for gestational age.  Pain/Pressure: Absent     Pelvic: Cervical exam deferred        Extremities: Normal range of motion.  Edema: None  Mental Status: Normal mood and affect. Normal behavior. Normal judgment and thought content.   Assessment and Plan:  Pregnancy: [id number] at [redacted]w[redacted]d  1. Supervision of high risk pregnancy, antepartum, first trimester  2. History of cesarean section Desires RCS Scheduled today for 39 weeks  3. GBS bacteriuria ppx in labor  4. Unwanted fertility Patient previously wanted this done, now declines Would like nexplanon  5. Sickle cell trait (HCC)  6. Rheumatoid arthritis involving multiple sites,  unspecified rheumatoid factor presence (HCC) On predinsone and embrel Managed by [facility], Dr. Kathi Ludwig   Preterm labor symptoms and general obstetric precautions including but not limited to vaginal bleeding, contractions, leaking of fluid and fetal movement were reviewed in detail with the patient. Please refer to After Visit Summary for other counseling recommendations.  Return in about 2 weeks (around 02/10/2018) for OB visit (MD).  Future Appointments  Date Time Provider Department Center  01/28/2018  2:15 PM [facility] [place] 4 [facility]    Conan Bowens, MD

## 2018-01-28 ENCOUNTER — Other Ambulatory Visit (HOSPITAL_COMMUNITY): Payer: Self-pay | Admitting: *Deleted

## 2018-01-28 ENCOUNTER — Ambulatory Visit (HOSPITAL_COMMUNITY)
Admission: RE | Admit: 2018-01-28 | Discharge: 2018-01-28 | Disposition: A | Discharge status: Home / Self Care | Payer: BLUE CROSS/BLUE SHIELD | Source: Ambulatory Visit | Attending: Obstetrics and Gynecology | Admitting: Obstetrics and Gynecology

## 2018-01-28 ENCOUNTER — Encounter (HOSPITAL_COMMUNITY): Payer: Self-pay

## 2018-01-28 DIAGNOSIS — M069 Rheumatoid arthritis, unspecified: Secondary | ICD-10-CM | POA: Diagnosis present

## 2018-01-28 DIAGNOSIS — Z3A28 28 weeks gestation of pregnancy: Secondary | ICD-10-CM | POA: Diagnosis not present

## 2018-01-28 DIAGNOSIS — O09522 Supervision of elderly multigravida, second trimester: Secondary | ICD-10-CM

## 2018-01-28 DIAGNOSIS — O9989 Other specified diseases and conditions complicating pregnancy, childbirth and the puerperium: Secondary | ICD-10-CM | POA: Diagnosis present

## 2018-01-28 DIAGNOSIS — O99212 Obesity complicating pregnancy, second trimester: Secondary | ICD-10-CM | POA: Diagnosis not present

## 2018-01-28 DIAGNOSIS — O34219 Maternal care for unspecified type scar from previous cesarean delivery: Secondary | ICD-10-CM

## 2018-01-28 DIAGNOSIS — O99891 Other specified diseases and conditions complicating pregnancy: Secondary | ICD-10-CM

## 2018-01-28 DIAGNOSIS — Z880 Allergy status to penicillin: Secondary | ICD-10-CM

## 2018-01-28 DIAGNOSIS — R8271 Bacteriuria: Secondary | ICD-10-CM

## 2018-01-28 LAB — CBC
HEMATOCRIT: 34.5 % (ref 34.0–46.6)
HEMOGLOBIN: 11 g/dL — AB (ref 11.1–15.9)
MCH: 26.6 pg (ref 26.6–33.0)
MCHC: 31.9 g/dL (ref 31.5–35.7)
MCV: 84 fL (ref 79–97)
Platelets: 257 10*3/uL (ref 150–450)
RBC: 4.13 x10E6/uL (ref 3.77–5.28)
RDW: 14 % (ref 12.3–15.4)
WBC: 7.8 10*3/uL (ref 3.4–10.8)

## 2018-01-28 LAB — GLUCOSE TOLERANCE, 2 HOURS W/ 1HR
GLUCOSE, 1 HOUR: 132 mg/dL (ref 65–179)
Glucose, 2 hour: 104 mg/dL (ref 65–152)
Glucose, Fasting: 81 mg/dL (ref 65–91)

## 2018-01-28 LAB — RPR: RPR Ser Ql: NONREACTIVE

## 2018-01-28 LAB — HIV ANTIBODY (ROUTINE TESTING W REFLEX): HIV Screen 4th Generation wRfx: NONREACTIVE

## 2018-02-12 ENCOUNTER — Encounter (HOSPITAL_COMMUNITY): Payer: Self-pay

## 2018-02-16 ENCOUNTER — Ambulatory Visit (INDEPENDENT_AMBULATORY_CARE_PROVIDER_SITE_OTHER): Payer: BLUE CROSS/BLUE SHIELD | Admitting: Obstetrics and Gynecology

## 2018-02-16 ENCOUNTER — Encounter: Payer: Self-pay | Admitting: Obstetrics and Gynecology

## 2018-02-16 VITALS — BP 139/69 | HR 82 | Wt 221.0 lb

## 2018-02-16 DIAGNOSIS — O0993 Supervision of high risk pregnancy, unspecified, third trimester: Secondary | ICD-10-CM

## 2018-02-16 DIAGNOSIS — O0991 Supervision of high risk pregnancy, unspecified, first trimester: Secondary | ICD-10-CM

## 2018-02-16 DIAGNOSIS — Z98891 History of uterine scar from previous surgery: Secondary | ICD-10-CM

## 2018-02-16 DIAGNOSIS — Z23 Encounter for immunization: Secondary | ICD-10-CM | POA: Diagnosis not present

## 2018-02-16 DIAGNOSIS — R8271 Bacteriuria: Secondary | ICD-10-CM

## 2018-02-16 MED ORDER — FLUCONAZOLE 150 MG PO TABS: 150.0000 mg | ORAL_TABLET | Freq: Once | ORAL | 0 refills | Status: AC

## 2018-02-16 NOTE — Addendum Note (Signed)
Addended by: Faythe Casa on: 02/16/2018 09:58 AM   Modules accepted: Orders

## 2018-02-16 NOTE — Progress Notes (Signed)
   PRENATAL VISIT NOTE  Subjective:  Catherine Underwood is a 37 y.o. [id number] at [redacted]w[redacted]d being seen today for ongoing prenatal care.  She is currently monitored for the following issues for this high-risk pregnancy and has Preventative health care; Supervision of high risk pregnancy, antepartum, first trimester; Rheumatoid arthritis (HCC); History of cesarean section; GBS bacteriuria; Penicillin allergy; and Sickle cell trait (HCC) on their problem list.  Patient reports pelvic pressure occasionally.  Contractions: Not present. Vag. Bleeding: None.  Movement: Present. Denies leaking of fluid.   The following portions of the patient's history were reviewed and updated as appropriate: allergies, current medications, past family history, past medical history, past social history, past surgical history and problem list. Problem list updated.  Objective:   Vitals:   02/16/18 0859  BP: 139/69  Pulse: 82  Weight: 221 lb (100.2 kg)    Fetal Status: Fetal Heart Rate (bpm): 142 Fundal Height: 31 cm Movement: Present     General:  Alert, oriented and cooperative. Patient is in no acute distress.  Skin: Skin is warm and dry. No rash noted.   Cardiovascular: Normal heart rate noted  Respiratory: Normal respiratory effort, no problems with respiration noted  Abdomen: Soft, gravid, appropriate for gestational age.  Pain/Pressure: Absent     Pelvic: Cervical exam deferred        Extremities: Normal range of motion.  Edema: None  Mental Status: Normal mood and affect. Normal behavior. Normal judgment and thought content.   Assessment and Plan:  Pregnancy: [id number] at [redacted]w[redacted]d  1. Supervision of high risk pregnancy, antepartum, first trimester Patient is doing well without complaints  2. History of cesarean section Repeat c-section scheduled on 11/6  3. GBS bacteriuria Prophylaxis if labor  4. RA Patient admits to taking prednisone prn, approximately once every two weeks Follow up growth on  9/19  Preterm labor symptoms and general obstetric precautions including but not limited to vaginal bleeding, contractions, leaking of fluid and fetal movement were reviewed in detail with the patient. Please refer to After Visit Summary for other counseling recommendations.  Return in about 2 weeks (around 03/02/2018) for [facility].  Future Appointments  Date Time Provider Department Center  02/25/2018 12:45 PM [facility] [place] 2 [facility]    Catalina Antigua, MD

## 2018-02-17 ENCOUNTER — Other Ambulatory Visit: Payer: Self-pay | Admitting: Obstetrics and Gynecology

## 2018-02-25 ENCOUNTER — Ambulatory Visit (HOSPITAL_COMMUNITY)
Admission: RE | Admit: 2018-02-25 | Discharge: 2018-02-25 | Disposition: A | Discharge status: Home / Self Care | Payer: BLUE CROSS/BLUE SHIELD | Source: Ambulatory Visit | Attending: Obstetrics and Gynecology | Admitting: Obstetrics and Gynecology

## 2018-02-25 ENCOUNTER — Encounter (HOSPITAL_COMMUNITY): Payer: Self-pay

## 2018-02-25 DIAGNOSIS — O9989 Other specified diseases and conditions complicating pregnancy, childbirth and the puerperium: Secondary | ICD-10-CM | POA: Diagnosis present

## 2018-02-25 DIAGNOSIS — O321XX0 Maternal care for breech presentation, not applicable or unspecified: Secondary | ICD-10-CM | POA: Insufficient documentation

## 2018-02-25 DIAGNOSIS — O99013 Anemia complicating pregnancy, third trimester: Secondary | ICD-10-CM | POA: Diagnosis not present

## 2018-02-25 DIAGNOSIS — M069 Rheumatoid arthritis, unspecified: Secondary | ICD-10-CM | POA: Insufficient documentation

## 2018-02-25 DIAGNOSIS — E669 Obesity, unspecified: Secondary | ICD-10-CM | POA: Insufficient documentation

## 2018-02-25 DIAGNOSIS — Z880 Allergy status to penicillin: Secondary | ICD-10-CM

## 2018-02-25 DIAGNOSIS — Z862 Personal history of diseases of the blood and blood-forming organs and certain disorders involving the immune mechanism: Secondary | ICD-10-CM

## 2018-02-25 DIAGNOSIS — Z3A32 32 weeks gestation of pregnancy: Secondary | ICD-10-CM | POA: Insufficient documentation

## 2018-02-25 DIAGNOSIS — O99213 Obesity complicating pregnancy, third trimester: Secondary | ICD-10-CM | POA: Diagnosis not present

## 2018-02-25 DIAGNOSIS — O34219 Maternal care for unspecified type scar from previous cesarean delivery: Secondary | ICD-10-CM | POA: Insufficient documentation

## 2018-02-25 DIAGNOSIS — D573 Sickle-cell trait: Secondary | ICD-10-CM | POA: Insufficient documentation

## 2018-02-25 DIAGNOSIS — O2693 Pregnancy related conditions, unspecified, third trimester: Secondary | ICD-10-CM

## 2018-02-25 DIAGNOSIS — O99891 Other specified diseases and conditions complicating pregnancy: Secondary | ICD-10-CM

## 2018-02-25 DIAGNOSIS — O09523 Supervision of elderly multigravida, third trimester: Secondary | ICD-10-CM | POA: Diagnosis not present

## 2018-02-25 DIAGNOSIS — R8271 Bacteriuria: Secondary | ICD-10-CM

## 2018-02-26 ENCOUNTER — Other Ambulatory Visit (HOSPITAL_COMMUNITY): Payer: Self-pay | Admitting: *Deleted

## 2018-02-26 DIAGNOSIS — M069 Rheumatoid arthritis, unspecified: Secondary | ICD-10-CM

## 2018-02-26 DIAGNOSIS — O9989 Other specified diseases and conditions complicating pregnancy, childbirth and the puerperium: Principal | ICD-10-CM

## 2018-02-26 DIAGNOSIS — O99891 Other specified diseases and conditions complicating pregnancy: Secondary | ICD-10-CM

## 2018-03-01 ENCOUNTER — Encounter: Payer: Self-pay | Admitting: *Deleted

## 2018-03-04 ENCOUNTER — Ambulatory Visit (INDEPENDENT_AMBULATORY_CARE_PROVIDER_SITE_OTHER): Payer: BLUE CROSS/BLUE SHIELD | Admitting: Family Medicine

## 2018-03-04 ENCOUNTER — Encounter: Payer: Self-pay | Admitting: Family Medicine

## 2018-03-04 VITALS — BP 107/65 | HR 105 | Wt 220.9 lb

## 2018-03-04 DIAGNOSIS — K219 Gastro-esophageal reflux disease without esophagitis: Secondary | ICD-10-CM

## 2018-03-04 DIAGNOSIS — O0991 Supervision of high risk pregnancy, unspecified, first trimester: Secondary | ICD-10-CM

## 2018-03-04 DIAGNOSIS — M069 Rheumatoid arthritis, unspecified: Secondary | ICD-10-CM

## 2018-03-04 DIAGNOSIS — Z98891 History of uterine scar from previous surgery: Secondary | ICD-10-CM

## 2018-03-04 MED ORDER — LANSOPRAZOLE 30 MG PO TBDP: 30.0000 mg | ORAL_TABLET | Freq: Every day | ORAL | 2 refills | Status: DC

## 2018-03-04 NOTE — Patient Instructions (Signed)

## 2018-03-04 NOTE — Progress Notes (Addendum)
   PRENATAL VISIT NOTE  Subjective:  Catherine Underwood is a 37 y.o. [id number] at [redacted]w[redacted]d being seen today for ongoing prenatal care.  She is currently monitored for the following issues for this high-risk pregnancy and has Preventative health care; Supervision of high risk pregnancy, antepartum, first trimester; Rheumatoid arthritis (HCC); History of cesarean section; GBS bacteriuria; Penicillin allergy; and Sickle cell trait (HCC) on their problem list.  Patient reports heartburn.  Contractions: Not present. Vag. Bleeding: None.  Movement: Present. Denies leaking of fluid.   The following portions of the patient's history were reviewed and updated as appropriate: allergies, current medications, past family history, past medical history, past social history, past surgical history and problem list. Problem list updated.  Objective:   Vitals:   03/04/18 1009  BP: 107/65  Pulse: (!) 105  Weight: 220 lb 14.4 oz (100.2 kg)    Fetal Status: Fetal Heart Rate (bpm): 137 Fundal Height: 33 cm Movement: Present     General:  Alert, oriented and cooperative. Patient is in no acute distress.  Skin: Skin is warm and dry. No rash noted.   Cardiovascular: Normal heart rate noted  Respiratory: Normal respiratory effort, no problems with respiration noted  Abdomen: Soft, gravid, appropriate for gestational age.  Pain/Pressure: Present     Pelvic: Cervical exam performed        Extremities: Normal range of motion.  Edema: None  Mental Status: Normal mood and affect. Normal behavior. Normal judgment and thought content.   Assessment and Plan:  Pregnancy: [id number] at [redacted]w[redacted]d  1. History of cesarean section Desires RCS--scheduled. Has h/o SVD--advised to try TOLAC.  2. Supervision of high risk pregnancy, antepartum, first trimester   3. Rheumatoid arthritis involving multiple sites, unspecified rheumatoid factor presence (HCC) U/S for growth 9/19--breech, nml fluid, 4 lb 10 oz 2109 gms (74%)--not in  testing, but normal growth. On Enbrel--prednisone is only prn--she is to instruct [place] if needs stress dose steroids at time of delivery. No symptoms currently.  4. Gastroesophageal reflux disease without esophagitis - lansoprazole (PREVACID SOLUTAB) 30 MG disintegrating tablet; Take 1 tablet (30 mg total) by mouth daily.  Dispense: 90 tablet; Refill: 2  Preterm labor symptoms and general obstetric precautions including but not limited to vaginal bleeding, contractions, leaking of fluid and fetal movement were reviewed in detail with the patient. Please refer to After Visit Summary for other counseling recommendations.  Return in 2 weeks (on 03/18/2018).  Future Appointments  Date Time Provider Department Center  03/19/2018  9:15 AM Conan Bowens, MD [facility]  03/25/2018  9:45 AM [facility] [place] 2 [facility]-US    Reva Bores, MD

## 2018-03-19 ENCOUNTER — Encounter: Payer: Self-pay | Admitting: Obstetrics and Gynecology

## 2018-03-19 ENCOUNTER — Ambulatory Visit (INDEPENDENT_AMBULATORY_CARE_PROVIDER_SITE_OTHER): Payer: BLUE CROSS/BLUE SHIELD | Admitting: Obstetrics and Gynecology

## 2018-03-19 VITALS — BP 110/73 | HR 102 | Wt 222.7 lb

## 2018-03-19 DIAGNOSIS — D573 Sickle-cell trait: Secondary | ICD-10-CM

## 2018-03-19 DIAGNOSIS — O368130 Decreased fetal movements, third trimester, not applicable or unspecified: Secondary | ICD-10-CM | POA: Diagnosis not present

## 2018-03-19 DIAGNOSIS — O0991 Supervision of high risk pregnancy, unspecified, first trimester: Secondary | ICD-10-CM

## 2018-03-19 DIAGNOSIS — R8271 Bacteriuria: Secondary | ICD-10-CM

## 2018-03-19 DIAGNOSIS — M05712 Rheumatoid arthritis with rheumatoid factor of left shoulder without organ or systems involvement: Secondary | ICD-10-CM

## 2018-03-19 DIAGNOSIS — Z98891 History of uterine scar from previous surgery: Secondary | ICD-10-CM

## 2018-03-19 NOTE — Progress Notes (Signed)
   PRENATAL VISIT NOTE  Subjective:  Catherine Underwood is a 37 y.o. [id number] at [redacted]w[redacted]d being seen today for ongoing prenatal care.  She is currently monitored for the following issues for this high-risk pregnancy and has Preventative health care; Supervision of high risk pregnancy, antepartum, first trimester; Rheumatoid arthritis (HCC); History of cesarean section; GBS bacteriuria; Penicillin allergy; and Sickle cell trait (HCC) on their problem list.  Patient reports decreased fetal movement for about 2 weeks, has days where she is not sure if baby moves at all..  Contractions: Irregular. Vag. Bleeding: None.  Movement: Present. Denies leaking of fluid.   The following portions of the patient's history were reviewed and updated as appropriate: allergies, current medications, past family history, past medical history, past social history, past surgical history and problem list. Problem list updated.  Objective:   Vitals:   03/19/18 0923  BP: 110/73  Pulse: (!) 102  Weight: 222 lb 11.2 oz (101 kg)    Fetal Status: Fetal Heart Rate (bpm): 131   Movement: Present     General:  Alert, oriented and cooperative. Patient is in no acute distress.  Skin: Skin is warm and dry. No rash noted.   Cardiovascular: Normal heart rate noted  Respiratory: Normal respiratory effort, no problems with respiration noted  Abdomen: Soft, gravid, appropriate for gestational age.  Pain/Pressure: Absent     Pelvic: Cervical exam deferred        Extremities: Normal range of motion.  Edema: None  Mental Status: Normal mood and affect. Normal behavior. Normal judgment and thought content.   Assessment and Plan:  Pregnancy: [id number] at [redacted]w[redacted]d  1. Supervision of high risk pregnancy, antepartum, first trimester  2. History of cesarean section RCS scheduled for 04/14/18  3. GBS bacteriuria  4. Sickle cell trait (HCC)  5. 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 Decreased fetal movement Less over last two weeks, sometimes doesn't move at all during day NST today reactive Encouraged patient to present with decreased movement   Preterm labor symptoms and general obstetric precautions including but not limited to vaginal bleeding, contractions, leaking of fluid and fetal movement were reviewed in detail with the patient. Please refer to After Visit Summary for other counseling recommendations.  Return in about 1 week (around 03/26/2018) for OB visit (MD).  Future Appointments  Date Time Provider Department Center  03/25/2018  9:45 AM [facility] [place] 2 [facility]-US    Conan Bowens, MD

## 2018-03-25 ENCOUNTER — Other Ambulatory Visit (HOSPITAL_COMMUNITY): Payer: Self-pay | Admitting: *Deleted

## 2018-03-25 ENCOUNTER — Ambulatory Visit (HOSPITAL_COMMUNITY)
Admission: RE | Admit: 2018-03-25 | Discharge: 2018-03-25 | Disposition: A | Discharge status: Home / Self Care | Payer: BLUE CROSS/BLUE SHIELD | Source: Ambulatory Visit | Attending: Obstetrics and Gynecology | Admitting: Obstetrics and Gynecology

## 2018-03-25 ENCOUNTER — Encounter (HOSPITAL_COMMUNITY): Payer: Self-pay

## 2018-03-25 DIAGNOSIS — O09523 Supervision of elderly multigravida, third trimester: Secondary | ICD-10-CM | POA: Diagnosis not present

## 2018-03-25 DIAGNOSIS — O9989 Other specified diseases and conditions complicating pregnancy, childbirth and the puerperium: Secondary | ICD-10-CM | POA: Diagnosis present

## 2018-03-25 DIAGNOSIS — O34219 Maternal care for unspecified type scar from previous cesarean delivery: Secondary | ICD-10-CM

## 2018-03-25 DIAGNOSIS — M069 Rheumatoid arthritis, unspecified: Secondary | ICD-10-CM | POA: Diagnosis not present

## 2018-03-25 DIAGNOSIS — O09529 Supervision of elderly multigravida, unspecified trimester: Secondary | ICD-10-CM

## 2018-03-25 DIAGNOSIS — O99891 Other specified diseases and conditions complicating pregnancy: Secondary | ICD-10-CM

## 2018-03-25 DIAGNOSIS — Z3A36 36 weeks gestation of pregnancy: Secondary | ICD-10-CM | POA: Diagnosis not present

## 2018-03-29 ENCOUNTER — Encounter: Payer: BLUE CROSS/BLUE SHIELD | Admitting: Obstetrics & Gynecology

## 2018-03-30 ENCOUNTER — Telehealth (HOSPITAL_COMMUNITY): Payer: Self-pay | Admitting: *Deleted

## 2018-03-30 NOTE — Telephone Encounter (Signed)
Preadmission screen  

## 2018-03-31 ENCOUNTER — Telehealth (HOSPITAL_COMMUNITY): Payer: Self-pay | Admitting: *Deleted

## 2018-03-31 NOTE — Telephone Encounter (Signed)
Preadmission screen  

## 2018-04-01 ENCOUNTER — Encounter (HOSPITAL_COMMUNITY): Payer: Self-pay

## 2018-04-01 ENCOUNTER — Ambulatory Visit (HOSPITAL_COMMUNITY)
Admission: RE | Admit: 2018-04-01 | Discharge: 2018-04-01 | Disposition: A | Discharge status: Home / Self Care | Payer: BLUE CROSS/BLUE SHIELD | Source: Ambulatory Visit | Attending: Obstetrics and Gynecology | Admitting: Obstetrics and Gynecology

## 2018-04-01 DIAGNOSIS — O09529 Supervision of elderly multigravida, unspecified trimester: Secondary | ICD-10-CM

## 2018-04-01 DIAGNOSIS — O09523 Supervision of elderly multigravida, third trimester: Secondary | ICD-10-CM

## 2018-04-01 DIAGNOSIS — O34219 Maternal care for unspecified type scar from previous cesarean delivery: Secondary | ICD-10-CM | POA: Diagnosis not present

## 2018-04-01 DIAGNOSIS — O99213 Obesity complicating pregnancy, third trimester: Secondary | ICD-10-CM

## 2018-04-01 DIAGNOSIS — Z3A37 37 weeks gestation of pregnancy: Secondary | ICD-10-CM

## 2018-04-01 DIAGNOSIS — Z862 Personal history of diseases of the blood and blood-forming organs and certain disorders involving the immune mechanism: Secondary | ICD-10-CM | POA: Diagnosis not present

## 2018-04-05 ENCOUNTER — Encounter: Payer: BLUE CROSS/BLUE SHIELD | Admitting: Family Medicine

## 2018-04-06 ENCOUNTER — Encounter (HOSPITAL_COMMUNITY): Payer: Self-pay

## 2018-04-06 ENCOUNTER — Inpatient Hospital Stay (HOSPITAL_COMMUNITY)
Admission: AD | Admit: 2018-04-06 | Discharge: 2018-04-06 | Disposition: A | Discharge status: Home / Self Care | Payer: BLUE CROSS/BLUE SHIELD | Source: Ambulatory Visit | Attending: Obstetrics & Gynecology | Admitting: Obstetrics & Gynecology

## 2018-04-06 DIAGNOSIS — Z3A37 37 weeks gestation of pregnancy: Secondary | ICD-10-CM | POA: Insufficient documentation

## 2018-04-06 DIAGNOSIS — Z87891 Personal history of nicotine dependence: Secondary | ICD-10-CM | POA: Insufficient documentation

## 2018-04-06 DIAGNOSIS — O471 False labor at or after 37 completed weeks of gestation: Secondary | ICD-10-CM | POA: Diagnosis not present

## 2018-04-06 DIAGNOSIS — O26893 Other specified pregnancy related conditions, third trimester: Secondary | ICD-10-CM | POA: Diagnosis present

## 2018-04-06 DIAGNOSIS — R109 Unspecified abdominal pain: Secondary | ICD-10-CM | POA: Diagnosis present

## 2018-04-06 DIAGNOSIS — Z880 Allergy status to penicillin: Secondary | ICD-10-CM

## 2018-04-06 DIAGNOSIS — R8271 Bacteriuria: Secondary | ICD-10-CM

## 2018-04-06 DIAGNOSIS — O34219 Maternal care for unspecified type scar from previous cesarean delivery: Secondary | ICD-10-CM | POA: Diagnosis not present

## 2018-04-06 DIAGNOSIS — Z3689 Encounter for other specified antenatal screening: Secondary | ICD-10-CM

## 2018-04-06 DIAGNOSIS — O479 False labor, unspecified: Secondary | ICD-10-CM

## 2018-04-06 LAB — WET PREP, GENITAL
CLUE CELLS WET PREP: NONE SEEN
Sperm: NONE SEEN
Trich, Wet Prep: NONE SEEN
YEAST WET PREP: NONE SEEN

## 2018-04-06 LAB — URINALYSIS, ROUTINE W REFLEX MICROSCOPIC
BILIRUBIN URINE: NEGATIVE
Glucose, UA: NEGATIVE mg/dL
Hgb urine dipstick: NEGATIVE
Ketones, ur: 5 mg/dL — AB
NITRITE: NEGATIVE
PROTEIN: NEGATIVE mg/dL
Specific Gravity, Urine: 1.014 (ref 1.005–1.030)
pH: 6 (ref 5.0–8.0)

## 2018-04-06 MED ORDER — CYCLOBENZAPRINE HCL 10 MG PO TABS: 10.0000 mg | ORAL_TABLET | Freq: Once | ORAL | Status: DC

## 2018-04-06 MED ORDER — CYCLOBENZAPRINE HCL 10 MG PO TABS
10.0000 mg | ORAL_TABLET | Freq: Once | ORAL | Status: DC
  Filled 2018-04-06: qty 1

## 2018-04-06 MED ORDER — CYCLOBENZAPRINE HCL 10 MG PO TABS: 10.0000 mg | ORAL_TABLET | Freq: Two times a day (BID) | ORAL | 0 refills | Status: DC | PRN

## 2018-04-06 MED ORDER — ACETAMINOPHEN 500 MG PO TABS: 1000.0000 mg | ORAL_TABLET | Freq: Once | ORAL | Status: DC

## 2018-04-06 NOTE — MAU Provider Note (Signed)
History     CSN: [id number]  Arrival date and time: 04/06/18 1452   First Provider Initiated Contact with Patient 04/06/18 1600      Chief Complaint  Patient presents with  . Abdominal Pain   HPI  Catherine Underwood is a 37 y.o. [id number] at [redacted]w[redacted]d who presents to MAU with chief complaint of back and abdominal pain. These are new problems, onset approximately one hour before arrival in MAU. Pain is 4/10, occurs simultaneously in low back and bilateral low abdomen. Does not radiate, no aggravating or alleviating factors. Patient has not attempted to manage with medication or other treatments.  Patient also c/o heavy vaginal discharge. States this is a recurring problem. Denies foul odor, urinary symptoms.  Denies vaginal bleeding, leaking of fluid, decreased fetal movement, fever, falls, or recent illness.    Patient Active Problem List   Diagnosis Date Noted  . Sickle cell trait (HCC) 11/13/2017  . GBS bacteriuria 11/11/2017  . Penicillin allergy 11/11/2017  . Supervision of high risk pregnancy, antepartum, first trimester 10/30/2017  . Rheumatoid arthritis (HCC) 10/30/2017  . History of cesarean section 10/30/2017  . Preventative health care 04/19/2011    OB History    Gravida  4   Para  2   Term  2   Preterm  0   AB  1   Living  2     SAB  0   TAB  0   Ectopic  1   Multiple  0   Live Births  2           Past Medical History:  Diagnosis Date  . Arthritis     Past Surgical History:  Procedure Laterality Date  . CESAREAN SECTION      Family History  Problem Relation Age of Onset  . Hyperlipidemia Mother     Social History   Tobacco Use  . Smoking status: Former Smoker    Types: Cigarettes    Last attempt to quit: 07/2017    Years since quitting: 0.7  . Smokeless tobacco: Never Used  . Tobacco comment: quit 08/18/2017  Substance Use Topics  . Alcohol use: No  . Drug use: No    Allergies:  Allergies  Allergen Reactions  . Red Dye  Swelling    Throat swells  . Meloxicam Hives and Itching  . Penicillins Other (See Comments)    Yeast infection Has patient had a PCN reaction causing immediate rash, facial/tongue/throat swelling, SOB or lightheadedness with hypotension: no Has patient had a PCN reaction causing severe rash involving mucus membranes or skin necrosis: no Has patient had a PCN reaction that required hospitalization: no Has patient had a PCN reaction occurring within the last 10 years: yes If all of the above answers are "NO", then may proceed with Cephalosporin use.   Marland Kitchen Morphine And Related Hives    Medications Prior to Admission  Medication Sig Dispense Refill Last Dose  . acetaminophen (TYLENOL) 500 MG tablet Take 1,000 mg by mouth every 6 (six) hours as needed for mild pain.   Past Month at Unknown time  . aspirin EC 81 MG tablet Take 1 tablet (81 mg total) by mouth daily. Take after 12 weeks for prevention of preeclampsia later in pregnancy 300 tablet 2 04/06/2018 at Unknown time  . etanercept (ENBREL) 50 MG/ML injection Inject 50 mg into the skin once a week. Patient typically administers on Wednesdays.   Past Week at Unknown time  . lansoprazole (PREVACID SOLUTAB)  30 MG disintegrating tablet Take 1 tablet (30 mg total) by mouth daily. 90 tablet 2 Past Week at Unknown time  . predniSONE (DELTASONE) 10 MG tablet Take 10 mg by mouth daily as needed (For rheumatoid arthritis flare up.).   Past Month at Unknown time  . Prenatal Vit-Fe Fumarate-FA (PRENATAL MULTIVITAMIN) TABS tablet Take 1 tablet by mouth daily at 12 noon.   04/06/2018 at Unknown time    Review of Systems  Constitutional: Negative for chills, fatigue and fever.  Respiratory: Negative for chest tightness.   Gastrointestinal: Positive for abdominal pain. Negative for nausea and vomiting.  Genitourinary: Positive for vaginal discharge. Negative for vaginal bleeding.  Musculoskeletal: Positive for back pain.  Neurological: Negative for  headaches.  All other systems reviewed and are negative.  Physical Exam   Blood pressure 120/79, pulse (!) 102, temperature 98.5 F (36.9 C), resp. rate 18, height 4\' 11"  (1.499 m), weight 103 kg, last menstrual period 07/15/2017, unknown if currently breastfeeding.  Physical Exam  Nursing note and vitals reviewed. Constitutional: She is oriented to person, place, and time. She appears well-developed and well-nourished.  Cardiovascular: Normal rate.  Respiratory: Effort normal.  GI: Soft. She exhibits no distension. There is no tenderness. There is no rebound and no guarding.  Gravid  Genitourinary: Uterus normal. No vaginal discharge found.  Genitourinary Comments: Cervix closed internally throughout time in MAU No abnormal vaginal discharge noted on exam  Neurological: She is alert and oriented to person, place, and time.  Skin: Skin is warm.  Psychiatric: She has a normal mood and affect. Her behavior is normal. Judgment and thought content normal.    MAU Course  Procedures  MDM --Patient's report of abdominal pain does not correlate to contractions visible on toco --Denies pain at time of discharge --EFM: baseline 135, moderate variability, positive 15 x 15 accelerations, no decelerations --Toco: irregular contractions gentled to uterine irritability after PO hydration in MAU  Patient Vitals for the past 24 hrs:  BP Temp Pulse Resp Height Weight  04/06/18 1753 119/72 - 91 - - -  04/06/18 1524 120/79 98.5 F (36.9 C) (!) 102 18 4\' 11"  (1.499 m) 103 kg    Results for orders placed or performed during the hospital encounter of 04/06/18 (from the past 24 hour(s))  Urinalysis, Routine w reflex microscopic     Status: Abnormal   Collection Time: 04/06/18  3:54 PM  Result Value Ref Range   Color, Urine YELLOW YELLOW   APPearance HAZY (A) CLEAR   Specific Gravity, Urine 1.014 1.005 - 1.030   pH 6.0 5.0 - 8.0   Glucose, UA NEGATIVE NEGATIVE mg/dL   Hgb urine dipstick  NEGATIVE NEGATIVE   Bilirubin Urine NEGATIVE NEGATIVE   Ketones, ur 5 (A) NEGATIVE mg/dL   Protein, ur NEGATIVE NEGATIVE mg/dL   Nitrite NEGATIVE NEGATIVE   Leukocytes, UA MODERATE (A) NEGATIVE   RBC / HPF 0-5 0 - 5 RBC/hpf   WBC, UA 0-5 0 - 5 WBC/hpf   Bacteria, UA RARE (A) NONE SEEN   Squamous Epithelial / LPF 0-5 0 - 5  Wet prep, genital     Status: Abnormal   Collection Time: 04/06/18  4:12 PM  Result Value Ref Range   Yeast Wet Prep HPF POC NONE SEEN NONE SEEN   Trich, Wet Prep NONE SEEN NONE SEEN   Clue Cells Wet Prep HPF POC NONE SEEN NONE SEEN   WBC, Wet Prep HPF POC MODERATE (A) NONE SEEN   Sperm  NONE SEEN      Assessment and Plan  --37 y.o. [id number] at [redacted]w[redacted]d  --Reactive fetal tracing --Cervix remains closed at time of discharge --Braxton hicks contractions resolved with PO hydration --Discharge home in stable condition,   Repeat LTCS 04/14/18  Calvert Cantor, CNM 04/06/2018, 6:21 PM

## 2018-04-06 NOTE — Discharge Instructions (Signed)
Research childbirth classes and hospital preregistration at [email or web address]  Fetal Movement Counts Patient Name: ________________________________________________ Patient Due Date: ____________________ What is a fetal movement count? A fetal movement count is the number of times that you feel your baby move during a certain amount of time. This may also be called a fetal kick count. A fetal movement count is recommended for every pregnant woman. You may be asked to start counting fetal movements as early as week 28 of your pregnancy. Pay attention to when your baby is most active. You may notice your baby's sleep and wake cycles. You may also notice things that make your baby move more. You should do a fetal movement count:  When your baby is normally most active.  At the same time each day.  A good time to count movements is while you are resting, after having something to eat and drink. How do I count fetal movements? 1. Find a quiet, comfortable area. Sit, or lie down on your side. 2. Write down the date, the start time and stop time, and the number of movements that you felt between those two times. Take this information with you to your health care visits. 3. For 2 hours, count kicks, flutters, swishes, rolls, and jabs. You should feel at least 10 movements during 2 hours. 4. You may stop counting after you have felt 10 movements. 5. If you do not feel 10 movements in 2 hours, have something to eat and drink. Then, keep resting and counting for 1 hour. If you feel at least 4 movements during that hour, you may stop counting. Contact a health care provider if:  You feel fewer than 4 movements in 2 hours.  Your baby is not moving like he or she usually does. Date: ____________ Start time: ____________ Stop time: ____________ Movements: ____________ Date: ____________ Start time: ____________ Stop time: ____________ Movements: ____________ Date: ____________ Start time: ____________  Stop time: ____________ Movements: ____________ Date: ____________ Start time: ____________ Stop time: ____________ Movements: ____________ Date: ____________ Start time: ____________ Stop time: ____________ Movements: ____________ Date: ____________ Start time: ____________ Stop time: ____________ Movements: ____________ Date: ____________ Start time: ____________ Stop time: ____________ Movements: ____________ Date: ____________ Start time: ____________ Stop time: ____________ Movements: ____________ Date: ____________ Start time: ____________ Stop time: ____________ Movements: ____________ This information is not intended to replace advice given to you by your health care provider. Make sure you discuss any questions you have with your health care provider. Document Released: 06/25/2006 Document Revised: 01/23/2016 Document Reviewed: 07/05/2015 Elsevier Interactive Patient Education  2018 [employer]  Braxton Hicks Contractions Contractions of the uterus can occur throughout pregnancy, but they are not always a sign that you are in labor. You may have practice contractions called Braxton Hicks contractions. These false labor contractions are sometimes confused with true labor. What are Braxton Hicks contractions? Braxton Hicks contractions are tightening movements that occur in the muscles of the uterus before labor. Unlike true labor contractions, these contractions do not result in opening (dilation) and thinning of the cervix. Toward the end of pregnancy (32-34 weeks), Braxton Hicks contractions can happen more often and may become stronger. These contractions are sometimes difficult to tell apart from true labor because they can be very uncomfortable. You should not feel embarrassed if you go to the hospital with false labor. Sometimes, the only way to tell if you are in true labor is for your health care provider to look for changes in the cervix. The health care provider will   do a physical  exam and may monitor your contractions. If you are not in true labor, the exam should show that your cervix is not dilating and your water has not broken. If there are other health problems associated with your pregnancy, it is completely safe for you to be sent home with false labor. You may continue to have Braxton Hicks contractions until you go into true labor. How to tell the difference between true labor and false labor True labor  Contractions last 30-70 seconds.  Contractions become very regular.  Discomfort is usually felt in the top of the uterus, and it spreads to the lower abdomen and low back.  Contractions do not go away with walking.  Contractions usually become more intense and increase in frequency.  The cervix dilates and gets thinner. False labor  Contractions are usually shorter and not as strong as true labor contractions.  Contractions are usually irregular.  Contractions are often felt in the front of the lower abdomen and in the groin.  Contractions may go away when you walk around or change positions while lying down.  Contractions get weaker and are shorter-lasting as time goes on.  The cervix usually does not dilate or become thin. Follow these instructions at home:  Take over-the-counter and prescription medicines only as told by your health care provider.  Keep up with your usual exercises and follow other instructions from your health care provider.  Eat and drink lightly if you think you are going into labor.  If Braxton Hicks contractions are making you uncomfortable: ? Change your position from lying down or resting to walking, or change from walking to resting. ? Sit and rest in a tub of warm water. ? Drink enough fluid to keep your urine pale yellow. Dehydration may cause these contractions. ? Do slow and deep breathing several times an hour.  Keep all follow-up prenatal visits as told by your health care provider. This is  important. Contact a health care provider if:  You have a fever.  You have continuous pain in your abdomen. Get help right away if:  Your contractions become stronger, more regular, and closer together.  You have fluid leaking or gushing from your vagina.  You pass blood-tinged mucus (bloody show).  You have bleeding from your vagina.  You have low back pain that you never had before.  You feel your baby's head pushing down and causing pelvic pressure.  Your baby is not moving inside you as much as it used to. Summary  Contractions that occur before labor are called Braxton Hicks contractions, false labor, or practice contractions.  Braxton Hicks contractions are usually shorter, weaker, farther apart, and less regular than true labor contractions. True labor contractions usually become progressively stronger and regular and they become more frequent.  Manage discomfort from Braxton Hicks contractions by changing position, resting in a warm bath, drinking plenty of water, or practicing deep breathing. This information is not intended to replace advice given to you by your health care provider. Make sure you discuss any questions you have with your health care provider. Document Released: 10/09/2016 Document Revised: 10/09/2016 Document Reviewed: 10/09/2016 Elsevier Interactive Patient Education  2018 Elsevier Inc.    

## 2018-04-06 NOTE — MAU Note (Signed)
Pt reports she is having back pain and abd pain that is constant. Started 1 hour ago. Good fetal movment reported. Denies any vaginal bleeding or discharge.

## 2018-04-08 ENCOUNTER — Ambulatory Visit (HOSPITAL_COMMUNITY)
Admission: RE | Admit: 2018-04-08 | Discharge: 2018-04-08 | Disposition: A | Discharge status: Home / Self Care | Payer: BLUE CROSS/BLUE SHIELD | Source: Ambulatory Visit | Attending: Obstetrics and Gynecology | Admitting: Obstetrics and Gynecology

## 2018-04-08 DIAGNOSIS — O99213 Obesity complicating pregnancy, third trimester: Secondary | ICD-10-CM | POA: Insufficient documentation

## 2018-04-08 DIAGNOSIS — D573 Sickle-cell trait: Secondary | ICD-10-CM | POA: Insufficient documentation

## 2018-04-08 DIAGNOSIS — O99013 Anemia complicating pregnancy, third trimester: Secondary | ICD-10-CM | POA: Diagnosis not present

## 2018-04-08 DIAGNOSIS — O09523 Supervision of elderly multigravida, third trimester: Secondary | ICD-10-CM

## 2018-04-08 DIAGNOSIS — O09529 Supervision of elderly multigravida, unspecified trimester: Secondary | ICD-10-CM

## 2018-04-08 DIAGNOSIS — Z3A38 38 weeks gestation of pregnancy: Secondary | ICD-10-CM

## 2018-04-08 DIAGNOSIS — O34219 Maternal care for unspecified type scar from previous cesarean delivery: Secondary | ICD-10-CM

## 2018-04-08 DIAGNOSIS — O2693 Pregnancy related conditions, unspecified, third trimester: Secondary | ICD-10-CM | POA: Diagnosis not present

## 2018-04-08 DIAGNOSIS — E669 Obesity, unspecified: Secondary | ICD-10-CM | POA: Insufficient documentation

## 2018-04-08 DIAGNOSIS — O09213 Supervision of pregnancy with history of pre-term labor, third trimester: Secondary | ICD-10-CM | POA: Diagnosis not present

## 2018-04-08 LAB — CULTURE, OB URINE: Culture: 50000 — AB

## 2018-04-09 ENCOUNTER — Other Ambulatory Visit: Payer: Self-pay | Admitting: Nurse Practitioner

## 2018-04-09 MED ORDER — AMOXICILLIN 500 MG PO TABS: 500.0000 mg | ORAL_TABLET | Freq: Three times a day (TID) | ORAL | 0 refills | Status: DC

## 2018-04-09 MED ORDER — TERCONAZOLE 0.4 % VA CREA: 1.0000 | TOPICAL_CREAM | Freq: Every day | VAGINAL | 0 refills | Status: DC

## 2018-04-09 NOTE — Progress Notes (Signed)
Catherine Underwood  37 y.o. [redacted]w[redacted]d  Urine culture reviewed.  GBS 50,000 col. Has red dye allergy  Also gets a yeast infection with penicillin - will send in Terazol for the yeast infection.  Reviewed symptoms with penicillin - no evidence of allergy - only has a yeast infection - amoxicillin prescribed to her pharmacy. Called client - verified DOB - reviewed results and medication prescribed.  Nolene Bernheim, RN, MSN, NP-BC Nurse Practitioner, [facility] for [employer], [facility] 04/09/2018 7:16 PM

## 2018-04-12 NOTE — Patient Instructions (Signed)
Catherine Underwood  04/12/2018   Your procedure is scheduled on:  04/14/2018  Enter through the Main Entrance of [facility] at 0730 AM.  Pick up the phone at the desk and dial [phone]  Call this number if you have problems the morning of surgery:[phone]  Remember:   Do not eat food:(After Midnight) Desps de medianoche.  Do not drink clear liquids: (After Midnight) Desps de medianoche.  Take these medicines the morning of surgery with A SIP OF WATER: none   Do not wear jewelry, make-up or nail polish.  Do not wear lotions, powders, or perfumes. Do not wear deodorant.  Do not shave 48 hours prior to surgery.  Do not bring valuables to the hospital.  [facility] is not   responsible for any belongings or valuables brought to the hospital.  Contacts, dentures or bridgework may not be worn into surgery.  Leave suitcase in the car. After surgery it may be brought to your room.  For patients admitted to the hospital, checkout time is 11:00 AM the day of              discharge.    N/A   Please read over the following fact sheets that you were given:   Surgical Site Infection Prevention

## 2018-04-13 ENCOUNTER — Inpatient Hospital Stay (HOSPITAL_COMMUNITY)
Admission: AD | Admit: 2018-04-13 | Discharge: 2018-04-14 | Disposition: A | Discharge status: Home / Self Care | Payer: BLUE CROSS/BLUE SHIELD | Source: Ambulatory Visit | Attending: Family Medicine | Admitting: Family Medicine

## 2018-04-13 ENCOUNTER — Other Ambulatory Visit: Payer: Self-pay

## 2018-04-13 ENCOUNTER — Encounter (HOSPITAL_COMMUNITY): Payer: Self-pay

## 2018-04-13 ENCOUNTER — Encounter (HOSPITAL_COMMUNITY)
Admission: RE | Admit: 2018-04-13 | Discharge: 2018-04-13 | Disposition: A | Discharge status: Home / Self Care | Payer: BLUE CROSS/BLUE SHIELD | Source: Ambulatory Visit | Attending: Obstetrics and Gynecology | Admitting: Obstetrics and Gynecology

## 2018-04-13 DIAGNOSIS — Z3A39 39 weeks gestation of pregnancy: Secondary | ICD-10-CM

## 2018-04-13 DIAGNOSIS — O34211 Maternal care for low transverse scar from previous cesarean delivery: Secondary | ICD-10-CM | POA: Diagnosis not present

## 2018-04-13 DIAGNOSIS — Z98891 History of uterine scar from previous surgery: Secondary | ICD-10-CM

## 2018-04-13 DIAGNOSIS — O479 False labor, unspecified: Secondary | ICD-10-CM

## 2018-04-13 DIAGNOSIS — O471 False labor at or after 37 completed weeks of gestation: Secondary | ICD-10-CM

## 2018-04-13 LAB — TYPE AND SCREEN
ABO/RH(D): O POS
Antibody Screen: NEGATIVE

## 2018-04-13 LAB — CBC
HEMATOCRIT: 34.1 % — AB (ref 36.0–46.0)
HEMOGLOBIN: 11.1 g/dL — AB (ref 12.0–15.0)
MCH: 26.6 pg (ref 26.0–34.0)
MCHC: 32.6 g/dL (ref 30.0–36.0)
MCV: 81.6 fL (ref 80.0–100.0)
Platelets: 214 10*3/uL (ref 150–400)
RBC: 4.18 MIL/uL (ref 3.87–5.11)
RDW: 14.5 % (ref 11.5–15.5)
WBC: 6.3 10*3/uL (ref 4.0–10.5)
nRBC: 0 % (ref 0.0–0.2)

## 2018-04-13 NOTE — MAU Note (Signed)
Pt states she started having contracting irregularly 2 hours ago within the last hour she states they have become regular coming every 5 minutes.   Reports +FM,   Reports light spotting when wiping that started within the last hour.   Denies LOF.

## 2018-04-13 NOTE — MAU Note (Signed)
Urine in lab 

## 2018-04-14 ENCOUNTER — Inpatient Hospital Stay (HOSPITAL_COMMUNITY): Payer: BLUE CROSS/BLUE SHIELD | Admitting: Certified Registered Nurse Anesthetist

## 2018-04-14 ENCOUNTER — Encounter (HOSPITAL_COMMUNITY): Payer: Self-pay | Admitting: *Deleted

## 2018-04-14 ENCOUNTER — Inpatient Hospital Stay (HOSPITAL_COMMUNITY)
Admission: RE | Admit: 2018-04-14 | Discharge: 2018-04-17 | DRG: 788 | Disposition: A | Discharge status: Home / Self Care | Payer: BLUE CROSS/BLUE SHIELD | Attending: Obstetrics and Gynecology | Admitting: Obstetrics and Gynecology

## 2018-04-14 ENCOUNTER — Encounter (HOSPITAL_COMMUNITY)
Admission: RE | Disposition: A | Discharge status: Home / Self Care | Payer: Self-pay | Source: Home / Self Care | Attending: Obstetrics and Gynecology

## 2018-04-14 DIAGNOSIS — O99824 Streptococcus B carrier state complicating childbirth: Secondary | ICD-10-CM | POA: Diagnosis present

## 2018-04-14 DIAGNOSIS — D573 Sickle-cell trait: Secondary | ICD-10-CM | POA: Diagnosis present

## 2018-04-14 DIAGNOSIS — O99214 Obesity complicating childbirth: Secondary | ICD-10-CM | POA: Diagnosis present

## 2018-04-14 DIAGNOSIS — Z3A39 39 weeks gestation of pregnancy: Secondary | ICD-10-CM

## 2018-04-14 DIAGNOSIS — O34211 Maternal care for low transverse scar from previous cesarean delivery: Secondary | ICD-10-CM | POA: Diagnosis present

## 2018-04-14 DIAGNOSIS — Z87891 Personal history of nicotine dependence: Secondary | ICD-10-CM | POA: Diagnosis not present

## 2018-04-14 DIAGNOSIS — O9902 Anemia complicating childbirth: Secondary | ICD-10-CM | POA: Diagnosis present

## 2018-04-14 DIAGNOSIS — Z880 Allergy status to penicillin: Secondary | ICD-10-CM | POA: Diagnosis not present

## 2018-04-14 DIAGNOSIS — Z98891 History of uterine scar from previous surgery: Secondary | ICD-10-CM

## 2018-04-14 DIAGNOSIS — O0991 Supervision of high risk pregnancy, unspecified, first trimester: Secondary | ICD-10-CM

## 2018-04-14 DIAGNOSIS — R8271 Bacteriuria: Secondary | ICD-10-CM | POA: Diagnosis present

## 2018-04-14 DIAGNOSIS — M069 Rheumatoid arthritis, unspecified: Secondary | ICD-10-CM | POA: Diagnosis present

## 2018-04-14 LAB — RPR: RPR Ser Ql: NONREACTIVE

## 2018-04-14 SURGERY — Surgical Case
Anesthesia: Spinal | Site: Abdomen | Wound class: Clean Contaminated

## 2018-04-14 MED ORDER — SODIUM CHLORIDE 0.9% FLUSH: 3.0000 mL | INTRAVENOUS | Status: DC | PRN

## 2018-04-14 MED ORDER — FENTANYL CITRATE (PF) 100 MCG/2ML IJ SOLN
50.0000 ug | Freq: Once | INTRAMUSCULAR | Status: AC
  Administered 2018-04-14: 50 ug via INTRAVENOUS
  Filled 2018-04-14: qty 2

## 2018-04-14 MED ORDER — DEXAMETHASONE SODIUM PHOSPHATE 4 MG/ML IJ SOLN
INTRAMUSCULAR | Status: AC
  Filled 2018-04-14: qty 1

## 2018-04-14 MED ORDER — PHENYLEPHRINE 8 MG IN D5W 100 ML (0.08MG/ML) PREMIX OPTIME
INJECTION | INTRAVENOUS | Status: AC
  Filled 2018-04-14: qty 100

## 2018-04-14 MED ORDER — MIDAZOLAM HCL 5 MG/5ML IJ SOLN
INTRAMUSCULAR | Status: DC | PRN
  Administered 2018-04-14: 2 mg via INTRAVENOUS

## 2018-04-14 MED ORDER — SIMETHICONE 80 MG PO CHEW: 80.0000 mg | CHEWABLE_TABLET | ORAL | Status: DC | PRN

## 2018-04-14 MED ORDER — OXYTOCIN 10 UNIT/ML IJ SOLN
INTRAMUSCULAR | Status: AC
  Filled 2018-04-14: qty 4

## 2018-04-14 MED ORDER — CEFAZOLIN SODIUM-DEXTROSE 2-4 GM/100ML-% IV SOLN
2.0000 g | Freq: Once | INTRAVENOUS | Status: AC
  Administered 2018-04-14: 2 g via INTRAVENOUS
  Filled 2018-04-14: qty 100

## 2018-04-14 MED ORDER — NALBUPHINE HCL 10 MG/ML IJ SOLN
5.0000 mg | INTRAMUSCULAR | Status: DC | PRN
  Administered 2018-04-15: 5 mg via SUBCUTANEOUS
  Filled 2018-04-14: qty 1

## 2018-04-14 MED ORDER — FENTANYL CITRATE (PF) 100 MCG/2ML IJ SOLN
INTRAMUSCULAR | Status: AC
  Filled 2018-04-14: qty 2

## 2018-04-14 MED ORDER — SIMETHICONE 80 MG PO CHEW
80.0000 mg | CHEWABLE_TABLET | ORAL | Status: DC
  Administered 2018-04-15 (×2): 80 mg via ORAL
  Filled 2018-04-14 (×2): qty 1

## 2018-04-14 MED ORDER — TRANEXAMIC ACID-NACL 1000-0.7 MG/100ML-% IV SOLN
INTRAVENOUS | Status: AC
  Filled 2018-04-14: qty 100

## 2018-04-14 MED ORDER — SCOPOLAMINE 1 MG/3DAYS TD PT72
MEDICATED_PATCH | TRANSDERMAL | Status: DC | PRN
  Administered 2018-04-14: 1 via TRANSDERMAL

## 2018-04-14 MED ORDER — PROMETHAZINE HCL 25 MG/ML IJ SOLN: 6.2500 mg | INTRAMUSCULAR | Status: DC | PRN

## 2018-04-14 MED ORDER — SENNOSIDES-DOCUSATE SODIUM 8.6-50 MG PO TABS
2.0000 | ORAL_TABLET | ORAL | Status: DC
  Filled 2018-04-14 (×2): qty 2

## 2018-04-14 MED ORDER — SCOPOLAMINE 1 MG/3DAYS TD PT72
MEDICATED_PATCH | TRANSDERMAL | Status: AC
  Filled 2018-04-14: qty 1

## 2018-04-14 MED ORDER — NALBUPHINE HCL 10 MG/ML IJ SOLN: 5.0000 mg | Freq: Once | INTRAMUSCULAR | Status: AC | PRN

## 2018-04-14 MED ORDER — ONDANSETRON HCL 4 MG/2ML IJ SOLN
INTRAMUSCULAR | Status: AC
  Filled 2018-04-14: qty 2

## 2018-04-14 MED ORDER — SODIUM CHLORIDE 0.9 % IR SOLN
Status: DC | PRN
  Administered 2018-04-14: 1

## 2018-04-14 MED ORDER — NALBUPHINE HCL 10 MG/ML IJ SOLN
5.0000 mg | INTRAMUSCULAR | Status: DC | PRN
  Administered 2018-04-15: 5 mg via INTRAVENOUS
  Filled 2018-04-14 (×2): qty 1

## 2018-04-14 MED ORDER — LACTATED RINGERS IV SOLN
INTRAVENOUS | Status: DC
  Administered 2018-04-14 (×2): via INTRAVENOUS

## 2018-04-14 MED ORDER — LACTATED RINGERS IV SOLN
INTRAVENOUS | Status: DC | PRN
  Administered 2018-04-14: 15:00:00 via INTRAVENOUS

## 2018-04-14 MED ORDER — KETOROLAC TROMETHAMINE 30 MG/ML IJ SOLN: 30.0000 mg | Freq: Four times a day (QID) | INTRAMUSCULAR | Status: AC | PRN

## 2018-04-14 MED ORDER — ACETAMINOPHEN 500 MG PO TABS: 1000.0000 mg | ORAL_TABLET | Freq: Four times a day (QID) | ORAL | Status: DC

## 2018-04-14 MED ORDER — METOCLOPRAMIDE HCL 5 MG/ML IJ SOLN
INTRAMUSCULAR | Status: AC
  Filled 2018-04-14: qty 2

## 2018-04-14 MED ORDER — OXYTOCIN 40 UNITS IN LACTATED RINGERS INFUSION - SIMPLE MED: 2.5000 [IU]/h | INTRAVENOUS | Status: AC

## 2018-04-14 MED ORDER — OXYCODONE HCL 5 MG PO TABS: 5.0000 mg | ORAL_TABLET | ORAL | Status: DC | PRN

## 2018-04-14 MED ORDER — ACETAMINOPHEN 10 MG/ML IV SOLN
1000.0000 mg | Freq: Once | INTRAVENOUS | Status: AC
  Administered 2018-04-14: 1000 mg via INTRAVENOUS
  Filled 2018-04-14: qty 100

## 2018-04-14 MED ORDER — MENTHOL 3 MG MT LOZG: 1.0000 | LOZENGE | OROMUCOSAL | Status: DC | PRN

## 2018-04-14 MED ORDER — ENOXAPARIN SODIUM 60 MG/0.6ML ~~LOC~~ SOLN
50.0000 mg | SUBCUTANEOUS | Status: DC
  Administered 2018-04-15 – 2018-04-17 (×3): 50 mg via SUBCUTANEOUS
  Filled 2018-04-14 (×3): qty 0.6

## 2018-04-14 MED ORDER — SCOPOLAMINE 1 MG/3DAYS TD PT72: 1.0000 | MEDICATED_PATCH | Freq: Once | TRANSDERMAL | Status: DC

## 2018-04-14 MED ORDER — OXYTOCIN 10 UNIT/ML IJ SOLN
INTRAVENOUS | Status: DC | PRN
  Administered 2018-04-14: 40 [IU] via INTRAVENOUS

## 2018-04-14 MED ORDER — SIMETHICONE 80 MG PO CHEW
80.0000 mg | CHEWABLE_TABLET | Freq: Three times a day (TID) | ORAL | Status: DC
  Administered 2018-04-15 – 2018-04-17 (×5): 80 mg via ORAL
  Filled 2018-04-14 (×5): qty 1

## 2018-04-14 MED ORDER — DEXAMETHASONE SODIUM PHOSPHATE 10 MG/ML IJ SOLN
INTRAMUSCULAR | Status: DC | PRN
  Administered 2018-04-14: 10 mg via INTRAVENOUS

## 2018-04-14 MED ORDER — ACETAMINOPHEN 500 MG PO TABS: 1000.0000 mg | ORAL_TABLET | Freq: Once | ORAL | Status: DC

## 2018-04-14 MED ORDER — FENTANYL CITRATE (PF) 100 MCG/2ML IJ SOLN
25.0000 ug | INTRAMUSCULAR | Status: DC | PRN
  Administered 2018-04-14 (×2): 25 ug via INTRAVENOUS
  Administered 2018-04-14: 50 ug via INTRAVENOUS

## 2018-04-14 MED ORDER — MORPHINE SULFATE (PF) 0.5 MG/ML IJ SOLN
INTRAMUSCULAR | Status: AC
  Filled 2018-04-14: qty 10

## 2018-04-14 MED ORDER — IBUPROFEN 600 MG PO TABS
600.0000 mg | ORAL_TABLET | Freq: Four times a day (QID) | ORAL | Status: DC
  Administered 2018-04-15 – 2018-04-17 (×9): 600 mg via ORAL
  Filled 2018-04-14 (×9): qty 1

## 2018-04-14 MED ORDER — MORPHINE SULFATE (PF) 0.5 MG/ML IJ SOLN
INTRAMUSCULAR | Status: DC | PRN
  Administered 2018-04-14: .15 mg via INTRATHECAL

## 2018-04-14 MED ORDER — WITCH HAZEL-GLYCERIN EX PADS: 1.0000 "application " | MEDICATED_PAD | CUTANEOUS | Status: DC | PRN

## 2018-04-14 MED ORDER — COCONUT OIL OIL: 1.0000 "application " | TOPICAL_OIL | Status: DC | PRN

## 2018-04-14 MED ORDER — BUPIVACAINE IN DEXTROSE 0.75-8.25 % IT SOLN
INTRATHECAL | Status: DC | PRN
  Administered 2018-04-14: 10.5 mg via INTRATHECAL

## 2018-04-14 MED ORDER — DIBUCAINE 1 % RE OINT: 1.0000 "application " | TOPICAL_OINTMENT | RECTAL | Status: DC | PRN

## 2018-04-14 MED ORDER — ONDANSETRON HCL 4 MG/2ML IJ SOLN: 4.0000 mg | Freq: Three times a day (TID) | INTRAMUSCULAR | Status: DC | PRN

## 2018-04-14 MED ORDER — LACTATED RINGERS IV SOLN
INTRAVENOUS | Status: DC
  Administered 2018-04-15: 02:00:00 via INTRAVENOUS

## 2018-04-14 MED ORDER — DIPHENHYDRAMINE HCL 25 MG PO CAPS: 25.0000 mg | ORAL_CAPSULE | Freq: Four times a day (QID) | ORAL | Status: DC | PRN

## 2018-04-14 MED ORDER — MEASLES, MUMPS & RUBELLA VAC IJ SOLR: 0.5000 mL | Freq: Once | INTRAMUSCULAR | Status: DC

## 2018-04-14 MED ORDER — METOCLOPRAMIDE HCL 5 MG/ML IJ SOLN
INTRAMUSCULAR | Status: DC | PRN
  Administered 2018-04-14: 10 mg via INTRAVENOUS

## 2018-04-14 MED ORDER — DIPHENHYDRAMINE HCL 50 MG/ML IJ SOLN
INTRAMUSCULAR | Status: DC | PRN
  Administered 2018-04-14: 12.5 mg via INTRAVENOUS

## 2018-04-14 MED ORDER — NALOXONE HCL 4 MG/10ML IJ SOLN: 1.0000 ug/kg/h | INTRAVENOUS | Status: DC | PRN

## 2018-04-14 MED ORDER — ZOLPIDEM TARTRATE 5 MG PO TABS: 5.0000 mg | ORAL_TABLET | Freq: Every evening | ORAL | Status: DC | PRN

## 2018-04-14 MED ORDER — TRANEXAMIC ACID 1000 MG/10ML IV SOLN
INTRAVENOUS | Status: DC | PRN
  Administered 2018-04-14: 1000 mg via INTRAVENOUS

## 2018-04-14 MED ORDER — NALBUPHINE HCL 10 MG/ML IJ SOLN
5.0000 mg | Freq: Once | INTRAMUSCULAR | Status: AC | PRN
  Administered 2018-04-14: 5 mg via INTRAVENOUS

## 2018-04-14 MED ORDER — HYDROCORTISONE NA SUCCINATE PF 100 MG IJ SOLR
50.0000 mg | Freq: Once | INTRAMUSCULAR | Status: AC
  Administered 2018-04-14: 50 mg via INTRAVENOUS
  Filled 2018-04-14: qty 2

## 2018-04-14 MED ORDER — ONDANSETRON HCL 4 MG/2ML IJ SOLN
INTRAMUSCULAR | Status: DC | PRN
  Administered 2018-04-14: 4 mg via INTRAVENOUS

## 2018-04-14 MED ORDER — DIPHENHYDRAMINE HCL 50 MG/ML IJ SOLN
INTRAMUSCULAR | Status: AC
  Filled 2018-04-14: qty 1

## 2018-04-14 MED ORDER — MEPERIDINE HCL 25 MG/ML IJ SOLN: 6.2500 mg | INTRAMUSCULAR | Status: DC | PRN

## 2018-04-14 MED ORDER — SODIUM CHLORIDE 0.9 % IV SOLN
500.0000 mg | Freq: Once | INTRAVENOUS | Status: AC
  Administered 2018-04-14: 500 mg via INTRAVENOUS
  Filled 2018-04-14: qty 500

## 2018-04-14 MED ORDER — NALOXONE HCL 0.4 MG/ML IJ SOLN: 0.4000 mg | INTRAMUSCULAR | Status: DC | PRN

## 2018-04-14 MED ORDER — ACETAMINOPHEN 325 MG PO TABS: 650.0000 mg | ORAL_TABLET | ORAL | Status: DC | PRN

## 2018-04-14 MED ORDER — HYDROCORTISONE NA SUCCINATE PF 100 MG IJ SOLR
25.0000 mg | Freq: Three times a day (TID) | INTRAMUSCULAR | Status: AC
  Administered 2018-04-14 – 2018-04-15 (×3): 25 mg via INTRAVENOUS
  Filled 2018-04-14 (×3): qty 0.5

## 2018-04-14 MED ORDER — FENTANYL CITRATE (PF) 100 MCG/2ML IJ SOLN
INTRAMUSCULAR | Status: DC | PRN
  Administered 2018-04-14: 85 ug via INTRAVENOUS
  Administered 2018-04-14: 15 ug via INTRATHECAL

## 2018-04-14 MED ORDER — PRENATAL MULTIVITAMIN CH: 1.0000 | ORAL_TABLET | Freq: Every day | ORAL | Status: DC

## 2018-04-14 MED ORDER — PHENYLEPHRINE 8 MG IN D5W 100 ML (0.08MG/ML) PREMIX OPTIME
INJECTION | INTRAVENOUS | Status: DC | PRN
  Administered 2018-04-14: 60 ug/min via INTRAVENOUS

## 2018-04-14 MED ORDER — TETANUS-DIPHTH-ACELL PERTUSSIS 5-2.5-18.5 LF-MCG/0.5 IM SUSP: 0.5000 mL | Freq: Once | INTRAMUSCULAR | Status: DC

## 2018-04-14 MED ORDER — PHENYLEPHRINE HCL 10 MG/ML IJ SOLN: INTRAMUSCULAR | Status: DC | PRN

## 2018-04-14 MED ORDER — OXYCODONE HCL 5 MG PO TABS: 10.0000 mg | ORAL_TABLET | ORAL | Status: DC | PRN

## 2018-04-14 MED ORDER — MIDAZOLAM HCL 2 MG/2ML IJ SOLN
INTRAMUSCULAR | Status: AC
  Filled 2018-04-14: qty 2

## 2018-04-14 MED ORDER — ACETAMINOPHEN 10 MG/ML IV SOLN: 1000.0000 mg | Freq: Once | INTRAVENOUS | Status: DC

## 2018-04-14 MED ORDER — DEXAMETHASONE SODIUM PHOSPHATE 10 MG/ML IJ SOLN
INTRAMUSCULAR | Status: AC
  Filled 2018-04-14: qty 1

## 2018-04-14 SURGICAL SUPPLY — 41 items
ADH SKN CLS LQ APL DERMABOND (GAUZE/BANDAGES/DRESSINGS) ×2
APL SKNCLS STERI-STRIP NONHPOA (GAUZE/BANDAGES/DRESSINGS) ×1
BENZOIN TINCTURE PRP APPL 2/3 (GAUZE/BANDAGES/DRESSINGS) ×2 IMPLANT
CHLORAPREP W/TINT 26ML (MISCELLANEOUS) ×2 IMPLANT
CLAMP CORD UMBIL (MISCELLANEOUS) IMPLANT
CLOSURE STERI STRIP 1/2 X4 (GAUZE/BANDAGES/DRESSINGS) ×1 IMPLANT
CLOTH BEACON ORANGE TIMEOUT ST (SAFETY) ×2 IMPLANT
DERMABOND ADHESIVE PROPEN (GAUZE/BANDAGES/DRESSINGS) ×2
DERMABOND ADVANCED .7 DNX6 (GAUZE/BANDAGES/DRESSINGS) IMPLANT
DRSG OPSITE POSTOP 4X10 (GAUZE/BANDAGES/DRESSINGS) ×2 IMPLANT
ELECT REM PT RETURN 9FT ADLT (ELECTROSURGICAL) ×2
ELECTRODE REM PT RTRN 9FT ADLT (ELECTROSURGICAL) ×1 IMPLANT
EXTRACTOR VACUUM M CUP 4 TUBE (SUCTIONS) IMPLANT
GLOVE BIOGEL PI IND STRL 7.0 (GLOVE) ×2 IMPLANT
GLOVE BIOGEL PI IND STRL 7.5 (GLOVE) ×2 IMPLANT
GLOVE BIOGEL PI INDICATOR 7.0 (GLOVE) ×2
GLOVE BIOGEL PI INDICATOR 7.5 (GLOVE) ×2
GLOVE ECLIPSE 7.5 STRL STRAW (GLOVE) ×2 IMPLANT
GOWN STRL REUS W/TWL LRG LVL3 (GOWN DISPOSABLE) ×6 IMPLANT
HEMOSTAT ARISTA ABSORB 3G PWDR (MISCELLANEOUS) ×1 IMPLANT
KIT ABG SYR 3ML LUER SLIP (SYRINGE) IMPLANT
NDL HYPO 25X5/8 SAFETYGLIDE (NEEDLE) IMPLANT
NEEDLE HYPO 25X5/8 SAFETYGLIDE (NEEDLE) IMPLANT
NS IRRIG 1000ML POUR BTL (IV SOLUTION) ×2 IMPLANT
PACK C SECTION WH (CUSTOM PROCEDURE TRAY) ×2 IMPLANT
PAD OB MATERNITY 4.3X12.25 (PERSONAL CARE ITEMS) ×2 IMPLANT
PENCIL SMOKE EVAC W/HOLSTER (ELECTROSURGICAL) ×2 IMPLANT
RETRACTOR TRAXI PANNICULUS (MISCELLANEOUS) IMPLANT
RTRCTR C-SECT PINK 25CM LRG (MISCELLANEOUS) ×2 IMPLANT
SPONGE LAP 18X18 RF (DISPOSABLE) ×7 IMPLANT
STRIP CLOSURE SKIN 1/2X4 (GAUZE/BANDAGES/DRESSINGS) ×2 IMPLANT
SUT PLAIN 2 0 XLH (SUTURE) ×1 IMPLANT
SUT VIC AB 0 CT1 36 (SUTURE) ×3 IMPLANT
SUT VIC AB 0 CTX 36 (SUTURE) ×6
SUT VIC AB 0 CTX36XBRD ANBCTRL (SUTURE) ×2 IMPLANT
SUT VIC AB 2-0 CT1 27 (SUTURE) ×2
SUT VIC AB 2-0 CT1 TAPERPNT 27 (SUTURE) ×1 IMPLANT
SUT VIC AB 4-0 KS 27 (SUTURE) ×3 IMPLANT
TOWEL OR 17X24 6PK STRL BLUE (TOWEL DISPOSABLE) ×2 IMPLANT
TRAXI PANNICULUS RETRACTOR (MISCELLANEOUS) ×1
TRAY FOLEY W/BAG SLVR 14FR LF (SET/KITS/TRAYS/PACK) ×2 IMPLANT

## 2018-04-14 NOTE — Op Note (Addendum)
Cesarean Section Operative Report  PATIENT: Catherine Underwood  PROCEDURE DATE: 04/14/2018  PREOPERATIVE DIAGNOSES: Intrauterine pregnancy at [redacted]w[redacted]d weeks gestation; patient declines vag del attempt  POSTOPERATIVE DIAGNOSES: The same  PROCEDURE: Repeat Low Transverse Cesarean Section  SURGEON:   Surgeon(s) and Role:    * Tremel Setters, Wilfred Curtis, MD - Primary    * Arvilla Market, DO - Assisting -OB Fellow   INDICATIONS: Catherine Underwood is a 37 y.o. [phone] at [redacted]w[redacted]d here for cesarean section secondary to the indications listed under preoperative diagnoses; please see preoperative note for further details.  The risks of cesarean section were discussed with the patient including but were not limited to: bleeding which may require transfusion or reoperation; infection which may require antibiotics; injury to bowel, bladder, ureters or other surrounding organs; injury to the fetus; need for additional procedures including hysterectomy in the event of a life-threatening hemorrhage; placental abnormalities wth subsequent pregnancies, incisional problems, thromboembolic phenomenon and other postoperative/anesthesia complications.   The patient concurred with the proposed plan, giving informed written consent for the procedure.    FINDINGS:  Viable female infant in cephalic presentation.  Apgars 9 and 9.  Clear amniotic fluid.  Intact placenta, three vessel cord.  Normal uterus, fallopian tubes and ovaries bilaterally. Significant adhesions to anterior rectus.  ANESTHESIA: Spinal INTRAVENOUS FLUIDS: 2000 mL  ESTIMATED BLOOD LOSS: 331 mL URINE OUTPUT:  150 ml SPECIMENS: Placenta sent to L&D COMPLICATIONS: None immediate  PROCEDURE IN DETAIL:  The patient preoperatively received intravenous antibiotics and had sequential compression devices applied to her lower extremities.  She was then taken to the operating room where spinal anesthesia was administered and was found to be adequate. She was  then placed in a dorsal supine position with a leftward tilt, and prepped and draped in a sterile manner.  A foley catheter was placed into her bladder and attached to constant gravity.    After an adequate timeout was performed, a Pfannenstiel skin incision was made with scalpel over her preexisting scar and carried through to the underlying layer of fascia. The fascia was incised in the midline, and this incision was extended bilaterally using the Mayo scissors.  Kocher clamps were applied to the superior aspect of the fascial incision and the underlying rectus muscles were dissected off bluntly.  A similar process was carried out on the inferior aspect of the fascial incision. The rectus muscles were separated in the midline bluntly and the peritoneum was entered bluntly. Attention was turned to the lower uterine segment where a low transverse hysterotomy was made with a scalpel and extended bilaterally bluntly.  The infant was successfully delivered, the cord was clamped and cut after one minute, and the infant was handed over to the awaiting neonatology team. Uterine massage was then administered, and the placenta delivered intact with a three-vessel cord. The uterus was then cleared of clots and debris.  The hysterotomy was closed with 0 Vicryl in a running locked fashion, and an imbricating layer was also placed with 0 Vicryl.  Figure-of-eight 0 Vicryl serosal stitches were placed to help with hemostasis.  The pelvis was cleared of all clot and debris.   Hemostasis was confirmed on all surfaces.  The peritoneum was closed with a 0 Vicryl running stitch and Arista was placed in the abdominal cavity. The fascia was then closed using 0 Vicryl in a running fashion.  The subcutaneous layer was irrigated, then reapproximated with 2-0 plain gut interrupted stitches.  The skin was closed with  a 4-0 Vicryl subcuticular stitch.   The patient tolerated the procedure well. Sponge, lap, instrument and needle counts  were correct x 3.  She was taken to the recovery room in stable condition.   An experienced assistant was required given the standard of surgical care given the complexity of the case.  This assistant was needed for exposure, dissection, suctioning, retraction, instrument exchange, assisting with delivery with administration of fundal pressure, and for overall help during the procedure.   Maternal Disposition: PACU - hemodynamically stable.   Infant Disposition: stable   Marcy Siren, D.O. OB Fellow  04/14/2018, 4:11 PM   Attestation of Attending Supervision of Provider:  Evaluation and management procedures were performed by this provider under my supervision and collaboration. I have reviewed the provider's note and chart, and I agree with the management and plan.   Shonna Chock, MD Faculty Practice, [facility]

## 2018-04-14 NOTE — Transfer of Care (Signed)
Immediate Anesthesia Transfer of Care Note  Patient: Catherine Underwood  Procedure(s) Performed: REPEAT CESAREAN SECTION (N/A Abdomen)  Patient Location: PACU  Anesthesia Type:Spinal  Level of Consciousness: awake, alert  and oriented  Airway & Oxygen Therapy: Patient Spontanous Breathing  Post-op Assessment: Report given to RN and Post -op Vital signs reviewed and stable  Post vital signs: Reviewed and stable  Last Vitals:  Vitals Value Taken Time  BP 118/78 04/14/2018  4:18 PM  Temp    Pulse 75 04/14/2018  4:19 PM  Resp 17 04/14/2018  4:19 PM  SpO2 98 % 04/14/2018  4:19 PM  Vitals shown include unvalidated device data.  Last Pain:  Vitals:   04/14/18 0748  TempSrc: Oral         Complications: No apparent anesthesia complications

## 2018-04-14 NOTE — Anesthesia Preprocedure Evaluation (Signed)
Anesthesia Evaluation  Patient identified by MRN, date of birth, ID band Patient awake    Reviewed: Allergy & Precautions, H&P , NPO status , Patient's Chart, lab work & pertinent test results  Airway Mallampati: III  TM Distance: >3 FB Neck ROM: full    Dental no notable dental hx. (+) Teeth Intact   Pulmonary former smoker,    Pulmonary exam normal breath sounds clear to auscultation       Cardiovascular negative cardio ROS Normal cardiovascular exam Rhythm:regular Rate:Normal     Neuro/Psych negative neurological ROS  negative psych ROS   GI/Hepatic negative GI ROS, Neg liver ROS,   Endo/Other  Morbid obesity  Renal/GU negative Renal ROS     Musculoskeletal   Abdominal (+) + obese,   Peds  Hematology negative hematology ROS (+)   Anesthesia Other Findings   Reproductive/Obstetrics (+) Pregnancy                             Anesthesia Physical Anesthesia Plan  ASA: III  Anesthesia Plan: Spinal   Post-op Pain Management:    Induction:   PONV Risk Score and Plan: Ondansetron, Dexamethasone and Scopolamine patch - Pre-op  Airway Management Planned: Natural Airway and Nasal Cannula  Additional Equipment:   Intra-op Plan:   Post-operative Plan:   Informed Consent: I have reviewed the patients History and Physical, chart, labs and discussed the procedure including the risks, benefits and alternatives for the proposed anesthesia with the patient or authorized representative who has indicated his/her understanding and acceptance.     Plan Discussed with: CRNA and Surgeon  Anesthesia Plan Comments:         Anesthesia Quick Evaluation

## 2018-04-14 NOTE — Anesthesia Procedure Notes (Signed)
Spinal  Patient location during procedure: OR Start time: 04/14/2018 2:15 PM End time: 04/14/2018 2:17 PM Staffing Anesthesiologist: Leilani Able, MD Performed: anesthesiologist  Preanesthetic Checklist Completed: patient identified, site marked, surgical consent, pre-op evaluation, timeout performed, IV checked, risks and benefits discussed and monitors and equipment checked Spinal Block Patient position: sitting Prep: site prepped and draped and DuraPrep Patient monitoring: continuous pulse ox and blood pressure Approach: midline Location: L3-4 Injection technique: single-shot Needle Needle type: Pencan  Needle gauge: 24 G Needle length: 10 cm Needle insertion depth: 6 cm Assessment Sensory level: T4

## 2018-04-14 NOTE — MAU Provider Note (Signed)
Called to MAU by RN for labor evaluation. Briefly, patient is a 37yo [id number] at [redacted]w[redacted]d who presented to MAU for contractions about 4 hours prior to my evaluation. Negative CST, reactive strip with wondering baseline but mostly 130s/mod/+a/-d. No change in cervical exam over 4 hours. I repeated check and agree with RN assessment of 2.5/50/ballotable. No evidence of ROM. Reports good fetal movement, denies vaginal bleeding.   Counseled patient on increased success rate of VBAC given early, spontaneous labor and favorable cervix. Counseled on risks and benefits of repeat Cesarean section versus VBAC. Patient voiced concern about GBS status, yeast infection, and discomfort with vaginal delivery. Explain in detail GBS prophylaxis and given option for early epidural. Patient continued to request repeat C-section. Given no cervical change and reassuring fetal status, patient not in active labor and stable for discharge home with plans to return in about 6 hours for scheduled C/S. Counseled on return precautions. Encouraged to try Tylenol and warm bath at home.   Cristal Deer. Earlene Plater, DO OB/GYN Fellow

## 2018-04-14 NOTE — H&P (Signed)
Obstetric Preoperative History and Physical  Catherine Underwood is a 37 y.o. [id number] with IUP at [redacted]w[redacted]d presenting for scheduled cesarean section.  No acute concerns.   Prenatal Course Source of Care: [facility]  with onset of care at 15 weeks Pregnancy complications or risks: Patient Active Problem List   Diagnosis Date Noted  . Sickle cell trait (HCC) 11/13/2017  . GBS bacteriuria 11/11/2017  . Penicillin allergy 11/11/2017  . Supervision of high risk pregnancy, antepartum, first trimester 10/30/2017  . Rheumatoid arthritis (HCC) 10/30/2017  . History of cesarean section 10/30/2017  . Preventative health care 04/19/2011   She plans to breastfeed She desires Nexplanon for postpartum contraception.   Prenatal labs and studies: ABO, Rh: --/--/O POS (11/05 0945) Antibody: NEG (11/05 0945) Rubella: 6.15 (05/24 1049) RPR: Non Reactive (11/05 0945)  HBsAg: Negative (05/24 1049)  HIV: Non Reactive (08/21 0904)  GBS: Positive in urine  2 hr Glucola  Normal  Genetic screening normal Anatomy US normal  Prenatal Transfer Tool  Maternal Diabetes: No Genetic Screening: Normal Maternal Ultrasounds/Referrals: Normal Fetal Ultrasounds or other Referrals:  Referred to Materal Fetal Medicine for serial growth scans due to RA on steroids  Maternal Substance Abuse:  No Significant Maternal Medications:  Meds include: Other: Prednisone 10 mg prn and Enbrel  Significant Maternal Lab Results: Lab values include: Group B Strep positive  Past Medical History:  Diagnosis Date  . Arthritis     Past Surgical History:  Procedure Laterality Date  . CESAREAN SECTION      OB History  Gravida Para Term Preterm AB Living  4 2 2  0 1 2  SAB TAB Ectopic Multiple Live Births  0 0 1 0 2    # Outcome Date GA Lbr Len/2nd Weight Sex Delivery Anes PTL Lv  4 Current           3 Term 09/03/06    M CS-Unspec   LIV  2 Ectopic 11/07/05          1 Term 02/08/05    M Vag-Spont   LIV    Social History    Socioeconomic History  . Marital status: Single    Spouse name: Not on file  . Number of children: Not on file  . Years of education: Not on file  . Highest education level: Not on file  Occupational History  . Not on file  Social Needs  . Financial resource strain: Not hard at all  . Food insecurity:    Worry: Never true    Inability: Never true  . Transportation needs:    Medical: No    Non-medical: Not on file  Tobacco Use  . Smoking status: Former Smoker    Types: Cigarettes    Last attempt to quit: 07/2017    Years since quitting: 0.7  . Smokeless tobacco: Never Used  . Tobacco comment: quit 08/18/2017  Substance and Sexual Activity  . Alcohol use: No  . Drug use: No  . Sexual activity: Not Currently    Birth control/protection: None  Lifestyle  . Physical activity:    Days per week: Not on file    Minutes per session: Not on file  . Stress: To some extent  Relationships  . Social connections:    Talks on phone: Not on file    Gets together: Not on file    Attends religious service: Not on file    Active member of club or organization: Not on file  Attends meetings of clubs or organizations: Not on file    Relationship status: Not on file  Other Topics Concern  . Not on file  Social History Narrative  . Not on file    Family History  Problem Relation Age of Onset  . Hyperlipidemia Mother     Medications Prior to Admission  Medication Sig Dispense Refill Last Dose  . acetaminophen (TYLENOL) 500 MG tablet Take 1,000 mg by mouth every 6 (six) hours as needed for mild pain.   Past Month at Unknown time  . amoxicillin (AMOXIL) 500 MG tablet Take 1 tablet (500 mg total) by mouth 3 (three) times daily for 7 days. 21 tablet 0   . aspirin EC 81 MG tablet Take 1 tablet (81 mg total) by mouth daily. Take after 12 weeks for prevention of preeclampsia later in pregnancy 300 tablet 2 04/06/2018 at Unknown time  . cyclobenzaprine (FLEXERIL) 10 MG tablet Take 1  tablet (10 mg total) by mouth 2 (two) times daily as needed for muscle spasms. 5 tablet 0   . etanercept (ENBREL) 50 MG/ML injection Inject 50 mg into the skin once a week. Patient typically administers on Wednesdays.   Past Week at Unknown time  . lansoprazole (PREVACID SOLUTAB) 30 MG disintegrating tablet Take 1 tablet (30 mg total) by mouth daily. 90 tablet 2 Past Week at Unknown time  . predniSONE (DELTASONE) 10 MG tablet Take 10 mg by mouth daily as needed (For rheumatoid arthritis flare up.).   Past Month at Unknown time  . Prenatal Vit-Fe Fumarate-FA (PRENATAL MULTIVITAMIN) TABS tablet Take 1 tablet by mouth daily at 12 noon.   04/06/2018 at Unknown time  . terconazole (TERAZOL 7) 0.4 % vaginal cream Place 1 applicator vaginally at bedtime. Use for 7 days. 45 g 0     Allergies  Allergen Reactions  . Red Dye Swelling    Throat swells  . Meloxicam Hives and Itching  . Morphine And Related Hives    Review of Systems: Negative except for what is mentioned in HPI.  Physical Exam: BP 128/79   Pulse 100   Temp 98.2 F (36.8 C) (Oral)   Resp 18   Ht 4\' 11"  (1.499 m)   Wt 102.7 kg   LMP 07/15/2017 (Exact Date)   BMI 45.75 kg/m  FHR by Doppler: 131 bpm CONSTITUTIONAL: Well-developed, well-nourished female in no acute distress.  HENT:  Normocephalic, atraumatic. Oropharynx is clear and moist EYES: Conjunctivae and EOM are normal. No scleral icterus.  NECK: Normal range of motion, supple SKIN: Skin is warm and dry. No rash noted. Not diaphoretic. No erythema. NEUROLGIC: Alert and oriented to person, place, and time. Normal reflexes, muscle tone coordination. No cranial nerve deficit noted. PSYCHIATRIC: Normal mood and affect. Normal behavior. CARDIOVASCULAR: Normal heart rate noted, regular rhythm RESPIRATORY: Effort and breath sounds normal, no problems with respiration noted ABDOMEN: Soft, nontender, nondistended, gravid. Well-healed Pfannenstiel incision. PELVIC:  Deferred MUSCULOSKELETAL: Normal range of motion. No edema and no tenderness. 2+ distal pulses.   Pertinent Labs/Studies:   Results for orders placed or performed during the hospital encounter of 04/13/18 (from the past 72 hour(s))  CBC     Status: Abnormal   Collection Time: 04/13/18  9:45 AM  Result Value Ref Range   WBC 6.3 4.0 - 10.5 K/uL   RBC 4.18 3.87 - 5.11 MIL/uL   Hemoglobin 11.1 (L) 12.0 - 15.0 g/dL   HCT 13/05/19 (L) 25.0 - 53.9 %   MCV 81.6  80.0 - 100.0 fL   MCH 26.6 26.0 - 34.0 pg   MCHC 32.6 30.0 - 36.0 g/dL   RDW 84.6 65.9 - 93.5 %   Platelets 214 150 - 400 K/uL   nRBC 0.0 0.0 - 0.2 %    Comment: Performed at [facility], [address], [place], [place] [address]  RPR     Status: None   Collection Time: 04/13/18  9:45 AM  Result Value Ref Range   RPR Ser Ql Non Reactive Non Reactive    Comment: (NOTE) Performed At: [facility] [address] [place], [place] [id number] Jolene Schimke MD ZR:[id number]   Type and screen     Status: None   Collection Time: 04/13/18  9:45 AM  Result Value Ref Range   ABO/RH(D) O POS    Antibody Screen NEG    Sample Expiration      04/16/2018 Performed at [facility], [address] [place], [place] [address]     Assessment and Plan :LESLI ISSA is a 37 y.o. [id number] at [redacted]w[redacted]d being admitted for scheduled cesarean section. Given that patient takes steroids for greater than 3 weeks per year, warrants moderate dose steroid dose steroids. Will give Hydrocortisone 50 mg IV immediately prior to surgery. Post-operatively will give Hydrocortisone 25 mg q8h x24h. The risks of cesarean section discussed with the patient included but were not limited to: bleeding which may require transfusion or reoperation; infection which may require antibiotics; injury to bowel, bladder, ureters or other surrounding organs; injury to the fetus; need for additional procedures including hysterectomy in the event of a life-threatening  hemorrhage; placental abnormalities wth subsequent pregnancies, incisional problems, thromboembolic phenomenon and other postoperative/anesthesia complications. The patient concurred with the proposed plan, giving informed written consent for the procedure. Patient has been NPO since last night she will remain NPO for procedure. Anesthesia and OR aware. Preoperative prophylactic antibiotics and SCDs ordered on call to the OR. To OR when ready.    Marcy Siren, D.O. OB Fellow  04/14/2018, 9:27 AM

## 2018-04-15 ENCOUNTER — Ambulatory Visit (HOSPITAL_COMMUNITY): Payer: BLUE CROSS/BLUE SHIELD

## 2018-04-15 LAB — CBC
HEMATOCRIT: 27.6 % — AB (ref 36.0–46.0)
HEMOGLOBIN: 9.1 g/dL — AB (ref 12.0–15.0)
MCH: 26.8 pg (ref 26.0–34.0)
MCHC: 33 g/dL (ref 30.0–36.0)
MCV: 81.2 fL (ref 80.0–100.0)
NRBC: 0 % (ref 0.0–0.2)
Platelets: 202 10*3/uL (ref 150–400)
RBC: 3.4 MIL/uL — ABNORMAL LOW (ref 3.87–5.11)
RDW: 14.5 % (ref 11.5–15.5)
WBC: 12.9 10*3/uL — AB (ref 4.0–10.5)

## 2018-04-15 LAB — CREATININE, SERUM
Creatinine, Ser: 0.52 mg/dL (ref 0.44–1.00)
GFR calc Af Amer: >60 mL/min (ref >60)
GFR calc non Af Amer: >60 mL/min (ref >60)

## 2018-04-15 NOTE — Anesthesia Postprocedure Evaluation (Signed)
Anesthesia Post Note  Patient: Catherine Underwood  Procedure(s) Performed: REPEAT CESAREAN SECTION (N/A Abdomen)     Patient location during evaluation: Mother Baby Anesthesia Type: Spinal Level of consciousness: awake, awake and alert and oriented Pain management: pain level controlled Vital Signs Assessment: post-procedure vital signs reviewed and stable Respiratory status: spontaneous breathing, nonlabored ventilation and respiratory function stable Cardiovascular status: stable Postop Assessment: no headache, no backache, patient able to bend at knees, no apparent nausea or vomiting, adequate PO intake and able to ambulate Anesthetic complications: no    Last Vitals:  Vitals:   04/15/18 0135 04/15/18 0530  BP: 119/61 96/64  Pulse: 84 87  Resp: 18 16  Temp: 36.9 C 36.9 C  SpO2: 98%     Last Pain:  Vitals:   04/15/18 0823  TempSrc:   PainSc: 0-No pain   Pain Goal:                 Marcio Hoque

## 2018-04-15 NOTE — Lactation Note (Signed)
This note was copied from a baby's chart. Lactation Consultation Note Baby 10 hrs old. Mom states BF going good since birth baby is latching w/o difficulty. Denies painful latch.  Mom has everted nipples. Hand expression w/colostrum noted. Mom has 2 other children that she didn't BF. 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 Newborn behavior, STS, I&O, cluster feeding discussed. Mom encouraged to feed baby 8-12 times/24 hours and with feeding cues. Mom stated baby has spit up a couple of times. Encouraged to call if needs assistance or has questions.  WH/LC brochure given w/resources, support groups and LC services.  [name] [id number] Date: 04/15/2018 Reason for consult: Initial assessment   Maternal Data Has patient been taught Hand Expression?: Yes Does the patient have breastfeeding experience prior to this delivery?: No  Feeding Feeding Type: Breast Fed  LATCH Score       Type of Nipple: Everted at rest and after stimulation  Comfort (Breast/Nipple): Soft / non-tender        Interventions Interventions: Breast feeding basics reviewed;Position options;Skin to skin;Breast massage;Hand express;Breast compression  Lactation Tools Discussed/Used WIC Program: Yes   Consult Status Consult Status: Follow-up Date: 04/16/18 Follow-up type: In-patient    Nyzir Dubois, Diamond Nickel 04/15/2018, 1:27 AM

## 2018-04-15 NOTE — Progress Notes (Signed)
Pt has been progressing appropriately but has been significantly drowsy throughout the night. When speaking with the patient and performing fundal checks, pt frequently falls asleep. RN reviewed baby safety and safe sleep, reminded pt to put baby in the bassinet when she feels sleepy. RN was assisting pt with latching baby around 0600 and pt fell asleep with baby in her arms and jerked, causing the baby to momentarily fall backwards against her arm. Infant showed no signs of distress. RN reminded pt again to put baby in the crib or call for assistance if she feels sleepy. RN entered the room at 0645 and pt was asleep again with baby in her arms at the breast. RN moved baby to the bassinet and reinforced safe sleep teaching. Pt received no narcotics during this shift, but did receive 2 PRN doses of Nubain 5mg  at 1911 and 0027. Day shift RN will continue to monitor.   , RN 04/15/2018 7:38 AM

## 2018-04-15 NOTE — Progress Notes (Signed)
Post Partum Day 1 Subjective: no complaints, up ad lib, tolerating PO and + flatus. Foley out this morning. Has not yet voided.   Objective: Blood pressure 96/64, pulse 87, temperature 98.5 F (36.9 C), temperature source Oral, resp. rate 16, height 4\' 11"  (1.499 m), weight 102.7 kg, last menstrual period 07/15/2017, SpO2 98 %, unknown if currently breastfeeding.  Physical Exam:  General: alert, cooperative, appears stated age and no distress Lochia: appropriate Uterine Fundus: firm Incision: covered with pressure dressing DVT Evaluation: No evidence of DVT seen on physical exam.  Recent Labs    04/13/18 0945 04/15/18 0535  HGB 11.1* 9.1*  HCT 34.1* 27.6*    Assessment/Plan: Plan for discharge tomorrow and Circumcision prior to discharge   LOS: 1 day   13/07/19 04/15/2018, 9:04 AM

## 2018-04-16 MED ORDER — PREDNISONE 10 MG PO TABS
10.0000 mg | ORAL_TABLET | Freq: Every day | ORAL | Status: AC
  Administered 2018-04-16: 10 mg via ORAL
  Filled 2018-04-16: qty 1

## 2018-04-16 MED ORDER — DIPHENHYDRAMINE HCL 50 MG/ML IJ SOLN
12.5000 mg | Freq: Once | INTRAMUSCULAR | Status: AC
  Administered 2018-04-16: 12.5 mg via INTRAVENOUS
  Filled 2018-04-16: qty 1

## 2018-04-16 NOTE — Progress Notes (Addendum)
POSTPARTUM PROGRESS NOTE  POD #2  Subjective:  Catherine Underwood is a 37 y.o. [id number] s/p rLTCS at [redacted]w[redacted]d  She reports she doing well. No acute events overnight. She denies any problems with ambulating, voiding or po intake. Denies nausea or vomiting. She has passed flatus. Pain is well controlled.  Lochia is mild.  Objective: Blood pressure 115/79, pulse 82, temperature 97.8 F (36.6 C), temperature source Oral, resp. rate (!) 22, height 4' 11"  (1.499 m), weight 102.7 kg, last menstrual period 07/15/2017, SpO2 99 %, unknown if currently breastfeeding.  Physical Exam:  General: alert, cooperative and no distress HEENT: Airway patent, pharynx non-erythematous, able to move tongue, normal size.  Chest: no respiratory distress, lungs clear, no increased WOB Heart: regular rate, distal pulses intact Abdomen: soft, nontender,  Uterine Fundus: firm, appropriately tender DVT Evaluation: No calf swelling or tenderness Extremities: no edema Skin: warm, dry; incision clean/dry/intact w/ pressure dressing in place   Recent Labs    04/13/18 0945 04/15/18 0535  HGB 11.1* 9.1*  HCT 34.1* 27.6*    Assessment/Plan: Catherine Underwood is a 37y.o. [id number]/p rLTCS at 373w0d POD#2 - Doing welll; pain well controlled. H/H appropriate  Routine postpartum care  OOB, ambulated  Lovenox for VTE prophylaxis Anemia: asymptomatic   Feeding: Breastfeeding   1. Throat swelling: Started this morning, given Benadryl 12.5 mg this am with minimal relief. VSS. Airway patent, able to talk without concern. No rashes or fever, lungs are clear. Patient declining additional benadryl to help symptoms, states when this happens at home the only medicine that helps is her prednisone.   -one time prednisone 1072m -monitor for improvement or worsening of symptoms.   Dispo: Plan for discharge tomorrow.   LOS: 2 days   SamDarrelyn Hillock.O. Family Medicine PGY-1  04/16/2018, 9:25 AM   I personally saw and  evaluated the patient, performing the key elements of the service. I developed and verified the management plan that is described in the resident's/student's note, and I agree with the content with my edits above. VSS, HRR&R, Resp unlabored, Legs neg.  FraNigel BertholdNM 04/21/2018 10:18 AM

## 2018-04-16 NOTE — Lactation Note (Signed)
This note was copied from a baby's chart. Lactation Consultation Note  [name] Date: 04/16/2018 Reason for consult: Follow-up assessment;Term Mom reports baby is latching well.  She chooses to give supplemental formula at times.  Questions answered.  Baby is currently in the nursery for circumcision.  Instructed to continue to feed with cues.  Encouraged to call for assist prn.  Maternal Data    Feeding    LATCH Score                   Interventions    Lactation Tools Discussed/Used     Consult Status Consult Status: Follow-up Date: 04/17/18 Follow-up type: In-patient    Huston Foley 04/16/2018, 2:50 PM

## 2018-04-16 NOTE — Progress Notes (Signed)
Pt is drowsy/sleepy but easily arousable. C/o "swollen uvula" and asking for Prednisone.  She thinks she ate something containing red dye.  Resident at bedside and evaluated, then ordered Benadryl 12.5mg  IV x one dose.  Pt states her throat/uvula feel a little better. VSS (Spo2=99%, HR=82) Will continue to monitor.

## 2018-04-17 MED ORDER — IBUPROFEN 600 MG PO TABS: 600.0000 mg | ORAL_TABLET | Freq: Four times a day (QID) | ORAL | 0 refills | Status: DC | PRN

## 2018-04-17 MED ORDER — OXYCODONE HCL 5 MG PO TABS: 5.0000 mg | ORAL_TABLET | ORAL | 0 refills | Status: DC | PRN

## 2018-04-17 NOTE — Discharge Instructions (Signed)

## 2018-04-17 NOTE — Lactation Note (Signed)
This note was copied from a baby's chart. Lactation Consultation Note  [name] QASTM'H Date: 04/17/2018   P3, Baby 65 hours old.  Mother is breastfeeding and formula feeding. Her breasts feel heavier. Encouraged her to breastfeed before offering formula to help establish milk supply. Provided mother w/ manual pump. Mom encouraged to feed baby 8-12 times/24 hours and with feeding cues.  Reviewed engorgement care and monitoring voids/stools.      Maternal Data    Feeding    LATCH Score                   Interventions    Lactation Tools Discussed/Used     Consult Status      Hardie Pulley 04/17/2018, 8:22 AM

## 2018-04-17 NOTE — Discharge Summary (Signed)
Postpartum Discharge Summary     [name] DOB: 1980/12/11 MRN: [id number]  Date of admission: 04/14/2018 Delivering Provider: Shonna Underwood BEDFORD   Date of discharge: 04/17/2018  Admitting diagnosis: RCS Intrauterine pregnancy: [redacted]w[redacted]d     Secondary diagnosis:  Active Problems:   Supervision of high risk pregnancy, antepartum, first trimester   Rheumatoid arthritis (HCC)   History of cesarean section   GBS bacteriuria   Sickle cell trait (HCC)   Status post repeat low transverse cesarean section  Additional problems: none     Discharge diagnosis: Term Pregnancy Delivered and Anemia                                                                                                Post partum procedures:none  Augmentation: N/A  Complications: None  Hospital course:  Scheduled C/S   37 y.o. yo [id number] at [redacted]w[redacted]d was admitted to the hospital 04/14/2018 for scheduled cesarean section with the following indication:Elective Repeat.  Membrane Rupture Time/Date: 2:56 PM ,04/14/2018   Patient delivered a Viable infant.04/14/2018  Details of operation can be found in separate operative note. On POD#1 her Hgb was 9.1 from 11.1.  Patient had a postpartum course remarkable for some throat swelling on the morning of POD#2, possibly due to some red dye she inadvertently had. She took Benadryl with minimal relief, but was able to get complete relief eventually with prednisone 10mg .  By POD#3 she is ambulating, tolerating a regular diet, passing flatus, and urinating well. She is planning on Nexplanon for pp contraception. Patient is discharged home in stable condition on  04/17/18.         Magnesium Sulfate recieved: No BMZ received: No  Physical exam  Vitals:   04/16/18 0520 04/16/18 0600 04/16/18 1430 04/16/18 2245  BP: 115/79  118/78 115/73  Pulse: 79 82 80 85  Resp: (!) 22  18 19   Temp: 97.8 F (36.6 C)  98 F (36.7 C) 98 F (36.7 C)  TempSrc: Oral  Oral Oral  SpO2:  95% 99% 98% 100%  Weight:      Height:       General: alert and cooperative Lochia: appropriate Uterine Fundus: firm Incision: honey comb intact, unchanged DVT Evaluation: No evidence of DVT seen on physical exam. Labs: Lab Results  Component Value Date   WBC 12.9 (H) 04/15/2018   HGB 9.1 (L) 04/15/2018   HCT 27.6 (L) 04/15/2018   MCV 81.2 04/15/2018   PLT 202 04/15/2018   CMP Latest Ref Rng & Units 04/15/2018  Glucose 65 - 99 mg/dL -  BUN 6 - 20 mg/dL -  Creatinine 13/12/2017 - 13/12/2017 mg/dL 1.75  Sodium 1.02 - 5.85 mmol/L -  Potassium 3.5 - 5.1 mmol/L -  Chloride 101 - 111 mmol/L -  CO2 22 - 32 mmol/L -  Calcium 8.9 - 10.3 mg/dL -  Total Protein 6.5 - 8.1 g/dL -  Total Bilirubin 0.3 - 1.2 mg/dL -  Alkaline Phos 38 - 277 U/L -  AST 15 - 41 U/L -  ALT 14 - 54 U/L -  Discharge instruction: per After Visit Summary and "Baby and Me Booklet".  After visit meds:  Allergies as of 04/17/2018      Reactions   Red Dye Swelling   Throat swells   Meloxicam Hives, Itching   Pt has tolerated ibuprofen in the past   Morphine And Related Hives      Medication List    STOP taking these medications   acetaminophen 500 MG tablet Commonly known as:  TYLENOL   amoxicillin 500 MG tablet Commonly known as:  AMOXIL   cyclobenzaprine 10 MG tablet Commonly known as:  FLEXERIL   lansoprazole 30 MG disintegrating tablet Commonly known as:  PREVACID SOLUTAB   terconazole 0.4 % vaginal cream Commonly known as:  TERAZOL 7     TAKE these medications   aspirin EC 81 MG tablet Take 1 tablet (81 mg total) by mouth daily. Take after 12 weeks for prevention of preeclampsia later in pregnancy   etanercept 50 MG/ML injection Commonly known as:  ENBREL Inject 50 mg into the skin once a week. Patient typically administers on Wednesdays.   ibuprofen 600 MG tablet Commonly known as:  ADVIL,MOTRIN Take 1 tablet (600 mg total) by mouth every 6 (six) hours as needed.   oxyCODONE 5 MG immediate  release tablet Commonly known as:  Oxy IR/ROXICODONE Take 1 tablet (5 mg total) by mouth every 4 (four) hours as needed (pain scale 4-7).   predniSONE 10 MG tablet Commonly known as:  DELTASONE Take 10 mg by mouth daily as needed (For rheumatoid arthritis flare up.).   prenatal multivitamin Tabs tablet Take 1 tablet by mouth daily at 12 noon.       Diet: routine diet  Activity: Advance as tolerated. Pelvic rest for 6 weeks.   Outpatient follow up:1wk incision check; 4wk PP visit Follow up Appt: Future Appointments  Date Time Provider Department Center  04/28/2018 10:30 AM [facility] NURSE [facility]  05/26/2018 10:55 AM Catherine Bores, MD [facility]   Follow up Visit: Follow-up Information    [facility].   Specialty:  Obstetrics and Gynecology Why:  Keep scheduled postpartum visits on Nov 20th and Dec 18th Contact information: [address] [place] [address] [phone]            Newborn Data: Live born female  Birth Weight: 7 lb 12.3 oz (3525 g) APGAR: 9, 9  Newborn Delivery   Birth date/time:  04/14/2018 14:57:00 Delivery type:  C-Section, Low Transverse Trial of labor:  No C-section categorization:  Repeat     Baby Feeding: Breast Disposition:home with mother   04/17/2018 Catherine Underwood, CNM  7:39 AM

## 2018-04-26 DIAGNOSIS — Z029 Encounter for administrative examinations, unspecified: Secondary | ICD-10-CM

## 2018-04-28 ENCOUNTER — Ambulatory Visit (INDEPENDENT_AMBULATORY_CARE_PROVIDER_SITE_OTHER): Payer: BLUE CROSS/BLUE SHIELD | Admitting: *Deleted

## 2018-04-28 VITALS — BP 120/80 | HR 101 | Wt 203.2 lb

## 2018-04-28 DIAGNOSIS — Z5189 Encounter for other specified aftercare: Secondary | ICD-10-CM

## 2018-04-28 NOTE — Progress Notes (Addendum)
Here for wound check. Had repeat c/s 04/14/18. Incision still covered by honeycomb dressing with old bloody discharge.  Removed honeycomb dressing. Wound intact with steristrips- some came off with dressing. Removed several loose strips and instructed patient to keep wound clean and dry and remove remaining strips after shower . Advised to call us or go to [facility] if notices wound opening, discharge, edema, redness. Advised to keep pp appt as scheduled. She voices understanding. Ardis Hughs RN

## 2018-04-28 NOTE — Progress Notes (Signed)
Patient seen and assessed by nursing staff.  Agree with documentation and plan.  

## 2018-05-26 ENCOUNTER — Ambulatory Visit (INDEPENDENT_AMBULATORY_CARE_PROVIDER_SITE_OTHER): Payer: BLUE CROSS/BLUE SHIELD | Admitting: Family Medicine

## 2018-05-26 ENCOUNTER — Encounter: Payer: Self-pay | Admitting: Family Medicine

## 2018-05-26 VITALS — BP 118/79 | HR 84 | Ht 59.5 in | Wt 200.9 lb

## 2018-05-26 DIAGNOSIS — Z3046 Encounter for surveillance of implantable subdermal contraceptive: Secondary | ICD-10-CM

## 2018-05-26 DIAGNOSIS — Z1389 Encounter for screening for other disorder: Secondary | ICD-10-CM | POA: Diagnosis not present

## 2018-05-26 MED ORDER — ETONOGESTREL 68 MG ~~LOC~~ IMPL
68.0000 mg | DRUG_IMPLANT | Freq: Once | SUBCUTANEOUS | Status: AC
  Administered 2018-05-26: 68 mg via SUBCUTANEOUS

## 2018-05-26 NOTE — Patient Instructions (Signed)
Etonogestrel implant  What is this medicine?  ETONOGESTREL (et oh noe JES trel) is a contraceptive (birth control) device. It is used to prevent pregnancy. It can be used for up to 3 years.  This medicine may be used for other purposes; ask your health care provider or pharmacist if you have questions.  COMMON BRAND NAME(S): Implanon, Nexplanon  What should I tell my health care provider before I take this medicine?  They need to know if you have any of these conditions:  -abnormal vaginal bleeding  -blood vessel disease or blood clots  -breast, cervical, endometrial, ovarian, liver, or uterine cancer  -diabetes  -gallbladder disease  -heart disease or recent heart attack  -high blood pressure  -high cholesterol or triglycerides  -kidney disease  -liver disease  -migraine headaches  -seizures  -stroke  -tobacco smoker  -an unusual or allergic reaction to etonogestrel, anesthetics or antiseptics, other medicines, foods, dyes, or preservatives  -pregnant or trying to get pregnant  -breast-feeding  How should I use this medicine?  This device is inserted just under the skin on the inner side of your upper arm by a health care professional.  Talk to your pediatrician regarding the use of this medicine in children. Special care may be needed.  Overdosage: If you think you have taken too much of this medicine contact a poison control center or emergency room at once.  NOTE: This medicine is only for you. Do not share this medicine with others.  What if I miss a dose?  This does not apply.  What may interact with this medicine?  Do not take this medicine with any of the following medications:  -amprenavir  -fosamprenavir  This medicine may also interact with the following medications:  -acitretin  -aprepitant  -armodafinil  -bexarotene  -bosentan  -carbamazepine  -certain medicines for fungal infections like fluconazole, ketoconazole, itraconazole and voriconazole  -certain medicines to treat hepatitis, HIV or  AIDS  -cyclosporine  -felbamate  -griseofulvin  -lamotrigine  -modafinil  -oxcarbazepine  -phenobarbital  -phenytoin  -primidone  -rifabutin  -rifampin  -rifapentine  -St. John's wort  -topiramate  This list may not describe all possible interactions. Give your health care provider a list of all the medicines, herbs, non-prescription drugs, or dietary supplements you use. Also tell them if you smoke, drink alcohol, or use illegal drugs. Some items may interact with your medicine.  What should I watch for while using this medicine?  This product does not protect you against HIV infection (AIDS) or other sexually transmitted diseases.  You should be able to feel the implant by pressing your fingertips over the skin where it was inserted. Contact your doctor if you cannot feel the implant, and use a non-hormonal birth control method (such as condoms) until your doctor confirms that the implant is in place. Contact your doctor if you think that the implant may have broken or become bent while in your arm.  You will receive a user card from your health care provider after the implant is inserted. The card is a record of the location of the implant in your upper arm and when it should be removed. Keep this card with your health records.  What side effects may I notice from receiving this medicine?  Side effects that you should report to your doctor or health care professional as soon as possible:  -allergic reactions like skin rash, itching or hives, swelling of the face, lips, or tongue  -breast lumps, breast tissue   changes, or discharge  -breathing problems  -changes in emotions or moods  -if you feel that the implant may have broken or bent while in your arm  -high blood pressure  -pain, irritation, swelling, or bruising at the insertion site  -scar at site of insertion  -signs of infection at the insertion site such as fever, and skin redness, pain or discharge  -signs and symptoms of a blood clot such as breathing  problems; changes in vision; chest pain; severe, sudden headache; pain, swelling, warmth in the leg; trouble speaking; sudden numbness or weakness of the face, arm or leg  -signs and symptoms of liver injury like dark yellow or brown urine; general ill feeling or flu-like symptoms; light-colored stools; loss of appetite; nausea; right upper belly pain; unusually weak or tired; yellowing of the eyes or skin  -unusual vaginal bleeding, discharge  Side effects that usually do not require medical attention (report to your doctor or health care professional if they continue or are bothersome):  -acne  -breast pain or tenderness  -headache  -irregular menstrual bleeding  -nausea  This list may not describe all possible side effects. Call your doctor for medical advice about side effects. You may report side effects to FDA at 1-800-FDA-1088.  Where should I keep my medicine?  This drug is given in a hospital or clinic and will not be stored at home.  NOTE: This sheet is a summary. It may not cover all possible information. If you have questions about this medicine, talk to your doctor, pharmacist, or health care provider.   2019 Elsevier/Gold Standard (2017-04-14 14:11:42)

## 2018-05-26 NOTE — Progress Notes (Signed)
Subjective:     Catherine Underwood is a 37 y.o. female who presents for a postpartum visit. She is 6 weeks postpartum following a low cervical transverse Cesarean section. I have fully reviewed the prenatal and intrapartum course. The delivery was at [redacted]w[redacted]d gestational weeks. Outcome: repeat cesarean section, low transverse incision. Anesthesia: spinal. Postpartum course has been unremarkable. Baby's course has been uncomplicated. Baby is feeding by both breast and bottle - Similac with Iron. Bleeding no bleeding. Bowel function is normal. Bladder function is normal. Patient is not sexually active. Contraception method is Nexplanon. Postpartum depression screening: negative. I have independently verified this information.  The following portions of the patient's history were reviewed and updated as appropriate: allergies, current medications, past family history, past medical history, past social history, past surgical history and problem list.  Review of Systems Pertinent items noted in HPI and remainder of comprehensive ROS otherwise negative.   Objective:    LMP 07/15/2017 (Exact Date)   General:  alert, cooperative and appears stated age  Lungs: normal effort  Heart:  regular rate and rhythm  Abdomen: soft, non-tender; bowel sounds normal; no masses,  no organomegaly, incision is well healed        Procedure: Patient given informed consent, signed copy in the chart, time out was performed. Pregnancy test was neg. Appropriate time out taken. Patient's left arm was prepped and draped in the usual sterile fashion. The ruler used to measure and mark insertion area. Pt was prepped with alcohol swab and then injected with 3 cc of 1% lidocaine with epinephrine. Pt was prepped with betadine, Nexplanon removed form packaging,  Device confirmed in needle, then inserted full length of needle and withdrawn per handbook instructions. Pt insertion site covered with pressure dressing.  Minimal blood loss. Pt  tolerated the procedure well.   Assessment:     M,; postpartum exam. Pap smear not done at today's visit.   Plan:    1. Contraception: Nexplanon 2. Annual 5/19 3. Follow up in: 5 months or as needed.

## 2018-05-27 LAB — POCT PREGNANCY, URINE: Preg Test, Ur: NEGATIVE

## 2019-03-01 ENCOUNTER — Emergency Department (HOSPITAL_COMMUNITY)
Admission: EM | Admit: 2019-03-01 | Discharge: 2019-03-02 | Discharge status: Against Medical Advice | Payer: BLUE CROSS/BLUE SHIELD | Attending: Emergency Medicine | Admitting: Emergency Medicine

## 2019-03-01 ENCOUNTER — Other Ambulatory Visit: Payer: Self-pay

## 2019-03-28 ENCOUNTER — Ambulatory Visit (INDEPENDENT_AMBULATORY_CARE_PROVIDER_SITE_OTHER)
Admission: RE | Admit: 2019-03-28 | Discharge: 2019-03-28 | Disposition: A | Discharge status: Home / Self Care | Payer: Self-pay | Source: Ambulatory Visit

## 2019-03-28 DIAGNOSIS — M069 Rheumatoid arthritis, unspecified: Secondary | ICD-10-CM

## 2019-03-28 MED ORDER — PREDNISONE 10 MG (21) PO TBPK: ORAL_TABLET | Freq: Every day | ORAL | 0 refills | Status: DC

## 2019-03-28 MED ORDER — IBUPROFEN 600 MG PO TABS: 600.0000 mg | ORAL_TABLET | Freq: Four times a day (QID) | ORAL | 0 refills | Status: DC | PRN

## 2019-03-28 NOTE — Discharge Instructions (Signed)
Important to follow up with PCP about possible methotrexate prescription/other more affordable maintenance medications. Take steroid pack as prescribed.

## 2019-03-28 NOTE — ED Provider Notes (Signed)
Virtual Visit via Video Note:  Catherine Underwood  initiated request for Telemedicine visit with [facility]gent [facility] team. I connected with Catherine Underwood  on 03/28/2019 at 9:02 AM  for a synchronized telemedicine visit using a video enabled HIPPA compliant telemedicine application. I verified that I am speaking with Catherine Underwood  using two identifiers. Catherine Hall-Potvin, PA-C  was physically located in a [place] [facility] site and Catherine Underwood was located at a different location.   The limitations of evaluation and management by telemedicine as well as the availability of in-person appointments were discussed. Patient was informed that she  may incur a bill ( including co-pay) for this virtual visit encounter. Catherine Underwood  expressed understanding and gave verbal consent to proceed with virtual visit.     History of Present Illness:Catherine Underwood  is a 38 y.o. female presents with acute rheumatoid arthritis flare in her right hand and fingers since last night.  Patient has tried icy hot, Biofreeze, oral 10 mg prednisone tabs, 600 mg ibuprofen tab with mild to moderate relief.  Patient endorsing stiffness, decreased range of motion, pain.  Was routinely followed by rheumatology, taking Enbrel, until recently due to loss of insurance from losing her job.  Has been without her chronic medication for the last month.  Patient previously used to take methotrexate, though this was discontinued when she was pregnant: Delivered 04/14/2018.  Patient currently not pregnant, has Nexplanon inserted, not having intercourse.  Patient has not talked with her primary care about her current situation/interest in resuming methotrexate.   Review of Systems  Constitutional: Negative for fever and malaise/fatigue.  Respiratory: Negative for cough and shortness of breath.   Cardiovascular: Negative for chest pain and palpitations.  Gastrointestinal: Negative for abdominal pain,  diarrhea and vomiting.  Musculoskeletal: Positive for joint pain. Negative for myalgias.    Past Medical History:  Diagnosis Date  . Arthritis     Allergies  Allergen Reactions  . Red Dye Swelling    Throat swells  . Meloxicam Hives and Itching    Pt has tolerated ibuprofen in the past  . Morphine And Related Hives        Observations/Objective: 38 year old female Sitting in no acute distress.  Patient is able to speak in full sentences without coughing, sneezing, wheezing.  Patient has decreased flexion of all fingers and right hand without obvious deformity.  Assessment and Plan: 1.  Rheumatoid arthritis flare We will treat with prednisone taper, refilled ibuprofen at patient's request.  Patient to follow-up with PCP/rheumatology to inform them of her current situation, as well as possibly resuming methotrexate.  Return precautions discussed, patient verbalized understanding and is agreeable to plan.  Follow Up Instructions: Patient to seek in person evaluation for persistent/worsening symptoms.   I discussed the assessment and treatment plan with the patient. The patient was provided an opportunity to ask questions and all were answered. The patient agreed with the plan and demonstrated an understanding of the instructions.   The patient was advised to call back or seek an in-person evaluation if the symptoms worsen or if the condition fails to improve as anticipated.  I provided 15 minutes of non-face-to-face time during this encounter.    [place], PA-C  03/28/2019 9:02 AM        Underwood, [place], PA-C 03/28/19 [phone]

## 2019-05-01 ENCOUNTER — Ambulatory Visit (INDEPENDENT_AMBULATORY_CARE_PROVIDER_SITE_OTHER)
Admission: RE | Admit: 2019-05-01 | Discharge: 2019-05-01 | Disposition: A | Discharge status: Home / Self Care | Payer: Self-pay | Source: Ambulatory Visit

## 2019-05-01 DIAGNOSIS — M7989 Other specified soft tissue disorders: Secondary | ICD-10-CM

## 2019-05-01 DIAGNOSIS — M79641 Pain in right hand: Secondary | ICD-10-CM

## 2019-05-01 DIAGNOSIS — M069 Rheumatoid arthritis, unspecified: Secondary | ICD-10-CM

## 2019-05-01 MED ORDER — PREDNISONE 20 MG PO TABS: ORAL_TABLET | ORAL | 0 refills | Status: DC

## 2019-05-01 NOTE — ED Provider Notes (Signed)
Virtual Visit via Video Note:  Catherine Underwood December  initiated request for Telemedicine visit with [facility] Urgent Care team. I connected with Catherine Underwood  on 05/01/2019 at 11:35 AM  for a synchronized telemedicine visit using a video enabled HIPPA compliant telemedicine application. I verified that I am speaking with Catherine Underwood  using two identifiers. Wallis Bamberg, PA-C  was physically located in a [facility] Urgent care site and Catherine Underwood was located at a different location.   The limitations of evaluation and management by telemedicine as well as the availability of in-person appointments were discussed. Patient was informed that she  may incur a bill ( including co-pay) for this virtual visit encounter. Catherine Underwood  expressed understanding and gave verbal consent to proceed with virtual visit.     History of Present Illness:Catherine Underwood  is a 38 y.o. female presents with 1 day history of acute onset worsening right hand pain and swelling. Also having tingling, pin and needles, burning sensation. Denies fever, redness, trauma, falls, neck pain, radicular symptoms. Has a hx of RA but is not on anything currently. ~1 month ago, patient had a VV, had to undergo prednisone and ibuprofen. Patient has an appt on 05/17/2019, plans on getting long term management then.     ROS  No current facility-administered medications for this encounter.    Current Outpatient Medications  Medication Sig Dispense Refill  . etanercept (ENBREL) 50 MG/ML injection Inject 50 mg into the skin once a week. Patient typically administers on Wednesdays.    Marland Kitchen ibuprofen (ADVIL) 600 MG tablet Take 1 tablet (600 mg total) by mouth every 6 (six) hours as needed. 30 tablet 0  . predniSONE (DELTASONE) 10 MG tablet Take 10 mg by mouth daily as needed (For rheumatoid arthritis flare up.).    Marland Kitchen predniSONE (STERAPRED UNI-PAK 21 TAB) 10 MG (21) TBPK tablet Take by mouth daily. Take steroid taper as  written 21 tablet 0     Allergies  Allergen Reactions  . Red Dye Swelling    Throat swells  . Meloxicam Hives and Itching    Pt has tolerated ibuprofen in the past  . Morphine And Related Hives     Past Medical History:  Diagnosis Date  . Arthritis     Past Surgical History:  Procedure Laterality Date  . CESAREAN SECTION    . CESAREAN SECTION N/A 04/14/2018   Procedure: REPEAT CESAREAN SECTION;  Surgeon: Kathrynn Running, MD;  Location: [facility] BIRTHING SUITES;  Service: Obstetrics;  Laterality: N/A;      Observations/Objective: Physical Exam Constitutional:      General: She is not in acute distress.    Appearance: Normal appearance. She is well-developed. She is not ill-appearing, toxic-appearing or diaphoretic.  Eyes:     Extraocular Movements: Extraocular movements intact.  Pulmonary:     Effort: Pulmonary effort is normal.  Musculoskeletal:     Comments: 1+ edema of distal portion of right hand extending into second through fifth fingers noted.  No obvious erythema.  Neurological:     General: No focal deficit present.     Mental Status: She is alert and oriented to person, place, and time.  Psychiatric:        Mood and Affect: Mood normal.        Behavior: Behavior normal.        Thought Content: Thought content normal.        Judgment: Judgment normal.  Assessment and Plan:  1. Right hand pain   2. Swelling of right hand   3. Swelling of finger   4. Rheumatoid arthritis involving right hand, unspecified whether rheumatoid factor present [facility])     Encourage patient to pursue more long-term management again for her rheumatoid arthritis.  In the meantime will use a 10-day course of prednisone given severity of patient's symptoms.  Counseled against concurrent NSAID use. Counseled patient on potential for adverse effects with medications prescribed/recommended today, ER and return-to-clinic precautions discussed, patient verbalized understanding.   Follow  Up Instructions:    I discussed the assessment and treatment plan with the patient. The patient was provided an opportunity to ask questions and all were answered. The patient agreed with the plan and demonstrated an understanding of the instructions.   The patient was advised to call back or seek an in-person evaluation if the symptoms worsen or if the condition fails to improve as anticipated.  I provided 15 minutes of non-face-to-face time during this encounter.    Jaynee Eagles, PA-C  05/01/2019 11:35 AM         Jaynee Eagles, PA-C 05/01/19 1145

## 2019-05-18 NOTE — Addendum Note (Signed)
Encounter addended by: Jaynee Eagles, PA-C on: 05/18/2019 8:11 AM  Actions taken: Letter saved

## 2019-10-14 ENCOUNTER — Ambulatory Visit (INDEPENDENT_AMBULATORY_CARE_PROVIDER_SITE_OTHER)
Admission: RE | Admit: 2019-10-14 | Discharge: 2019-10-14 | Disposition: A | Discharge status: Home / Self Care | Payer: 59 | Source: Ambulatory Visit

## 2019-10-14 DIAGNOSIS — M25562 Pain in left knee: Secondary | ICD-10-CM | POA: Diagnosis not present

## 2019-10-14 DIAGNOSIS — M25541 Pain in joints of right hand: Secondary | ICD-10-CM | POA: Diagnosis not present

## 2019-10-14 MED ORDER — PREDNISONE 10 MG PO TABS: 40.0000 mg | ORAL_TABLET | Freq: Every day | ORAL | 0 refills | Status: AC

## 2019-10-14 NOTE — Discharge Instructions (Addendum)
Take the prednisone as directed.    Follow up with your Rheumatologist as soon as possible.

## 2019-10-14 NOTE — ED Provider Notes (Signed)
Virtual Visit via Video Note:  Catherine Underwood December  initiated request for Telemedicine visit with [facility] team. I connected with Catherine Underwood  on 10/14/2019 at 5:17 PM  for a synchronized telemedicine visit using a video enabled HIPPA compliant telemedicine application. I verified that I am speaking with Catherine Underwood  using two identifiers. Catherine Bail, NP  was physically located in a [facility] Urgent care site and Catherine Underwood was located at a different location.   The limitations of evaluation and management by telemedicine as well as the availability of in-person appointments were discussed. Patient was informed that she  may incur a bill ( including co-pay) for this virtual visit encounter. Catherine Underwood  expressed understanding and gave verbal consent to proceed with virtual visit.     History of Present Illness:Catherine Underwood  is a 39 y.o. female presents for evaluation of flare of her chronic arthritis pain; worse currently in her left knee and right hand.  She states she lost her medications; both prednisone and methotrexate.  Last seen by Rheumatology on 08/15/2019.  She denies joint redness or warmth; no fever or chills.   She denies other symptoms.  No falls or injury.     Allergies  Allergen Reactions  . Red Dye Swelling    Throat swells  . Meloxicam Hives and Itching    Pt has tolerated ibuprofen in the past  . Morphine And Related Hives     Past Medical History:  Diagnosis Date  . Arthritis      Social History   Tobacco Use  . Smoking status: Former Smoker    Types: Cigarettes    Quit date: 07/2017    Years since quitting: 2.2  . Smokeless tobacco: Never Used  . Tobacco comment: quit 08/18/2017  Substance Use Topics  . Alcohol use: No  . Drug use: No   ROS: as stated in HPI.  All other systems reviewed and negative.      Observations/Objective: Physical Exam  VITALS: Patient denies fever. GENERAL: Alert, appears well  and in no acute distress. HEENT: Atraumatic. NECK: Normal movements of the head and neck. CARDIOPULMONARY: No increased WOB. Speaking in clear sentences. I:E ratio WNL.  MS: Moves all visible extremities without noticeable abnormality. PSYCH: Pleasant and cooperative, well-groomed. Speech normal rate and rhythm. Affect is appropriate. Insight and judgement are appropriate. Attention is focused, linear, and appropriate.  NEURO: CN grossly intact. Oriented as arrived to appointment on time with no prompting. Moves both UE equally.  SKIN: No obvious lesions, wounds, erythema, or cyanosis noted on face or hands.   Assessment and Plan:    ICD-10-CM   1. Arthralgia of right hand  M25.541   2. Arthralgia of left knee  M25.562        Follow Up Instructions: Treating with prednisone.  Instructed patient to call her rheumatologist to schedule an appointment as soon as possible.  Instructed her to come to the urgent care for in-person evaluation or go to the ED if she has uncontrolled pain.  Patient agrees to plan of care.   I discussed the assessment and treatment plan with the patient. The patient was provided an opportunity to ask questions and all were answered. The patient agreed with the plan and demonstrated an understanding of the instructions.   The patient was advised to call back or seek an in-person evaluation if the symptoms worsen or if the condition fails to improve as  anticipated.      Sharion Balloon, NP  10/14/2019 5:17 PM         Sharion Balloon, NP 10/14/19 [phone]

## 2019-11-05 IMAGING — US US MFM OB DETAIL+14 WK
1 series · 14 of 28 positions shown · non-contrast
Comparison: none

[Series 1: us mfm ob detail+14 wk · 79 acquisitions, 14 frames shown]
[im 3/79]
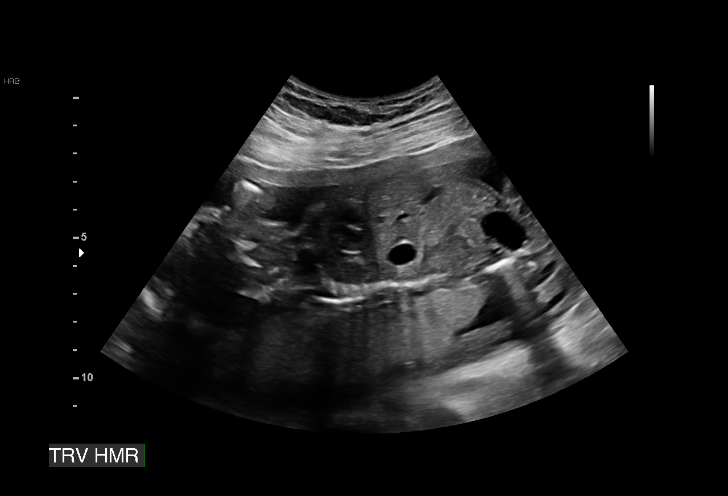
[im 9/79]
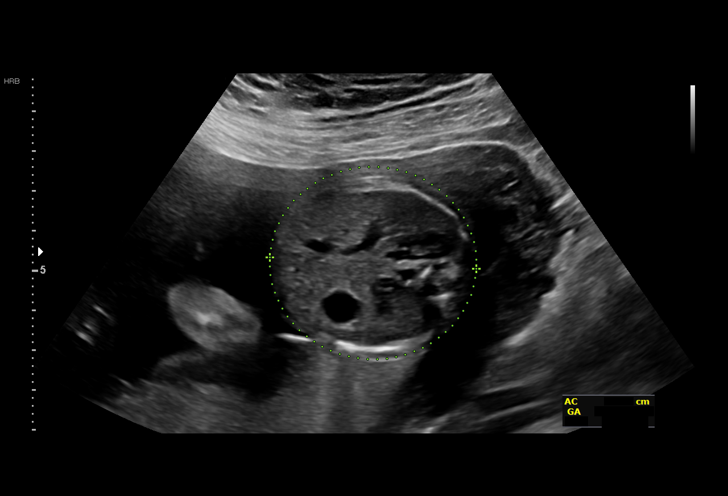
[im 15/79]
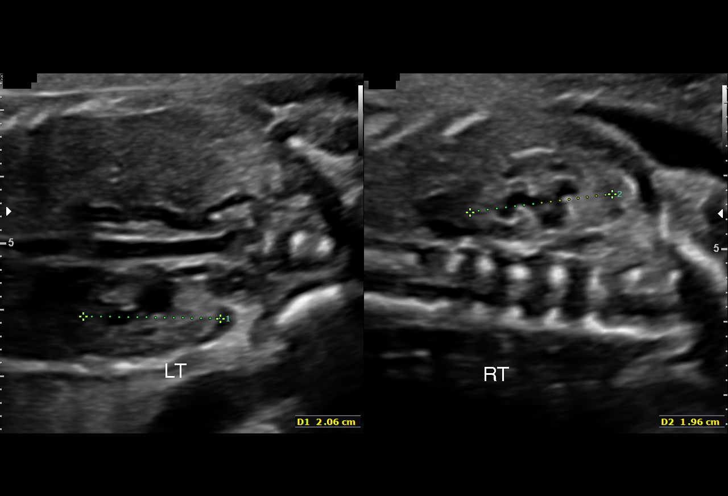
[im 21/79]
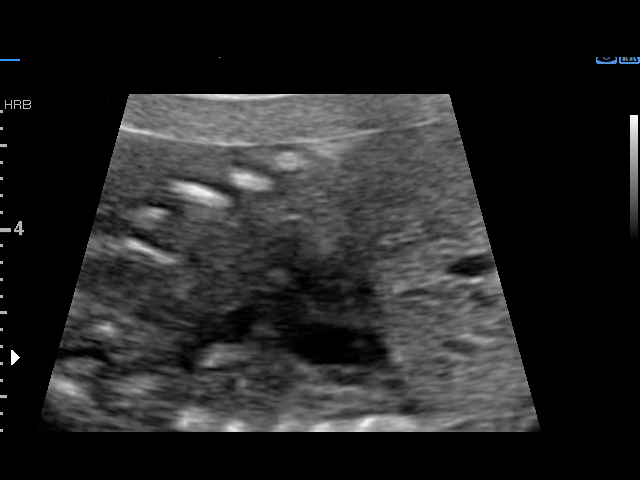
[im 27/79]
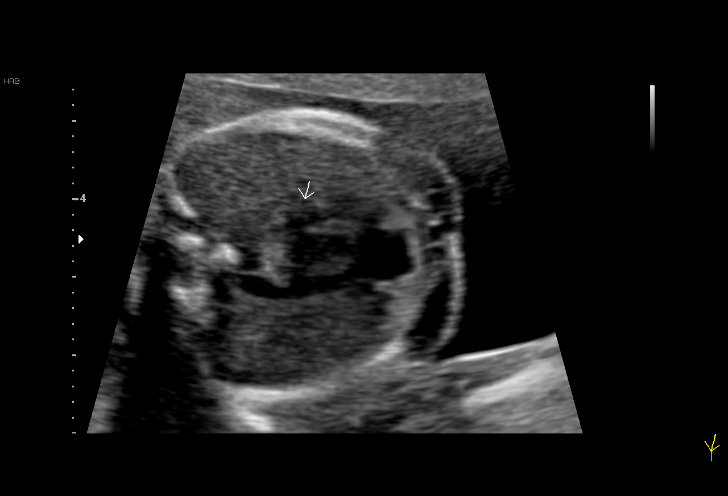
[im 32/79]
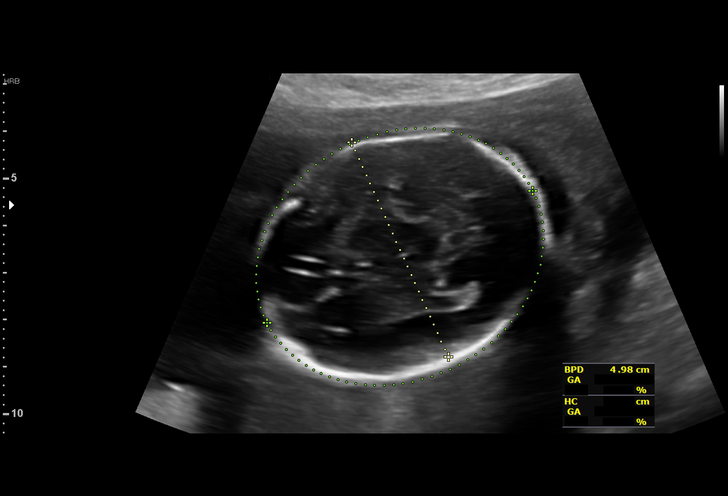
[im 38/79]
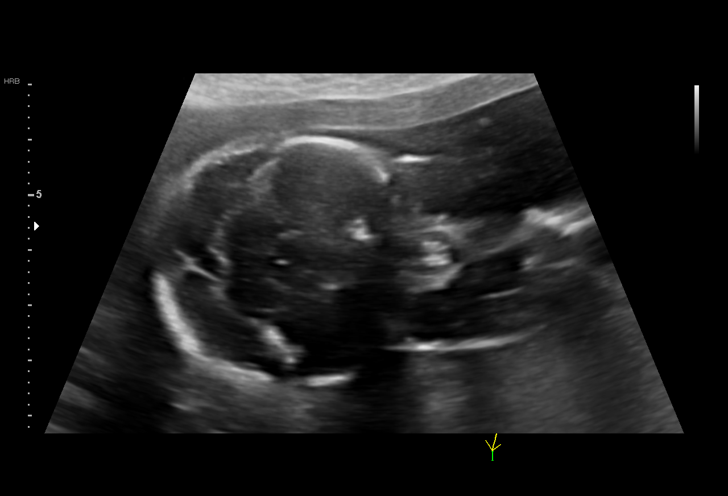
[im 44/79]
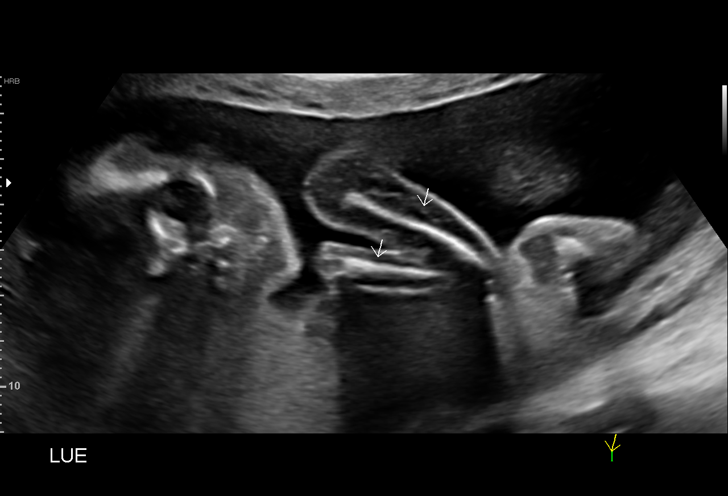
[im 50/79]
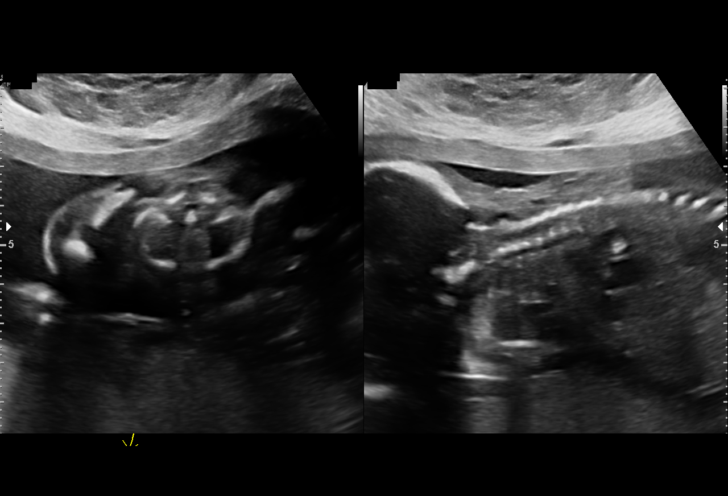
[im 55/79]
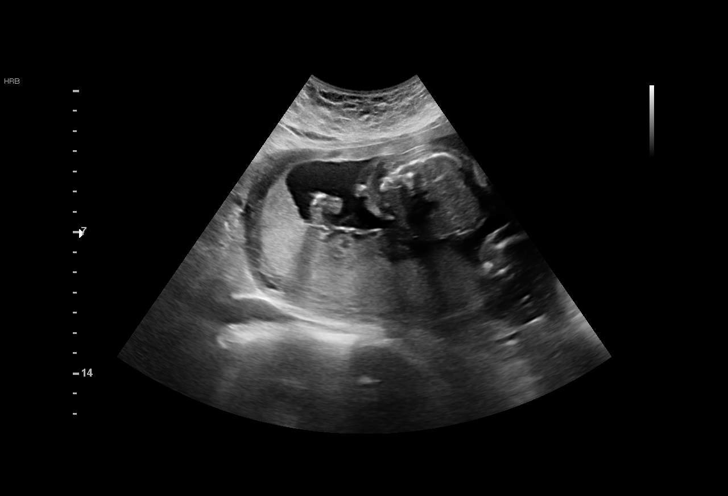
[im 61/79]
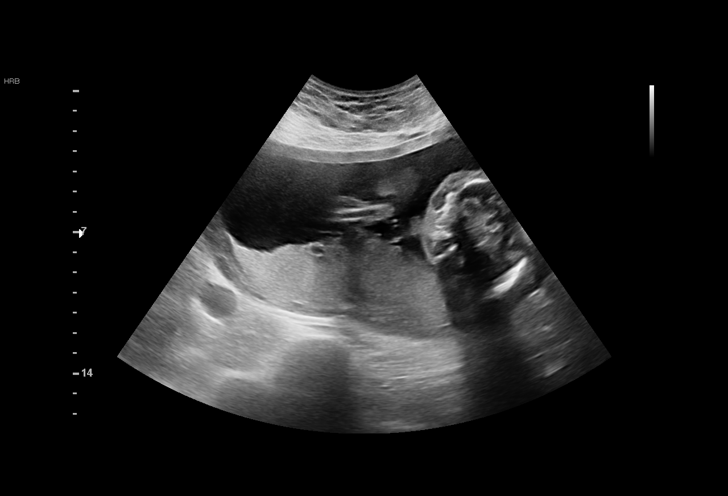
[im 67/79]
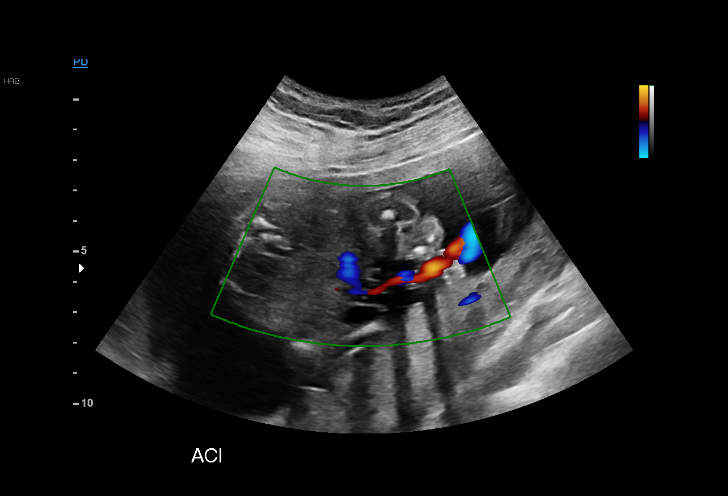
[im 73/79]
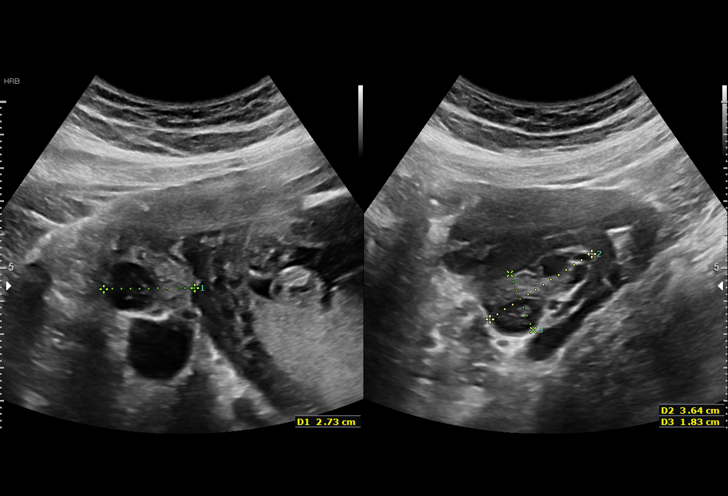
[im 79/79]
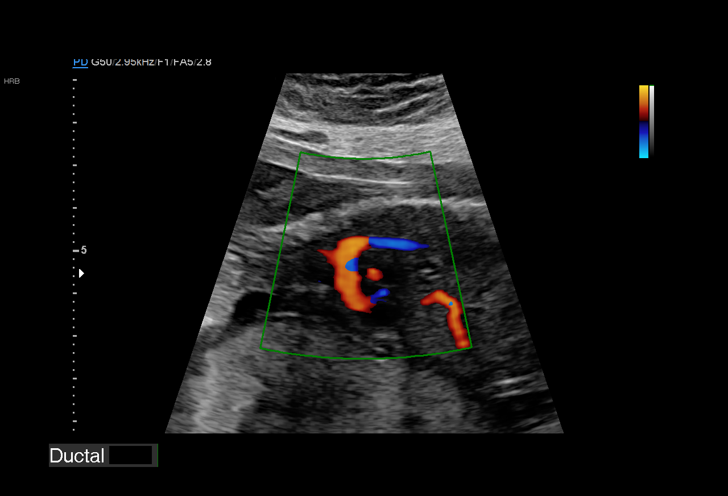

[14 of 28 positions shown; findings below may reference images not displayed]

1  RICARDO QU            [id number]      [id number]     [id number]
Indications

20 weeks gestation of pregnancy
Advanced maternal age multigravida 35+,
second trimester
Previous cesarean delivery, antepartum
Obesity complicating pregnancy, second
trimester
History of sickle cell trait
OB History

Blood Type:            Height:  4'11"  Weight (lb):  216       BMI:
Gravidity:    4         Term:   2
Ectopic:      1        Living:  2
Fetal Evaluation

Num Of Fetuses:     1
Cardiac Activity:   Observed
Presentation:       Transverse, head to maternal right
Placenta:           Posterior, above cervical os
P. Cord Insertion:  Visualized, central

Amniotic Fluid
AFI FV:      Subjectively within normal limits

Largest Pocket(cm)
5.59
Biometry

BPD:      49.6  mm     G. Age:  21w 0d         82  %    CI:        75.51   %    70 - 86
FL/HC:      18.1   %    16.8 -
HC:       181   mm     G. Age:  20w 4d         59  %    HC/AC:      1.14        1.09 -
AC:      158.1  mm     G. Age:  20w 6d         70  %    FL/BPD:     65.9   %
FL:       32.7  mm     G. Age:  20w 1d         45  %    FL/AC:      20.7   %    20 - 24
CER:        23  mm     G. Age:  21w 4d         78  %
NFT:       3.9  mm
CM:        3.9  mm

Est. FW:     366  gm    0 lb 13 oz      55  %
Gestational Age

LMP:           20w 1d        Date:  07/15/17                 EDD:   04/21/18
U/S Today:     20w 5d                                        EDD:   04/17/18
Best:          20w 1d     Det. By:  LMP  (07/15/17)          EDD:   04/21/18
Anatomy

Cranium:               Appears normal         Aortic Arch:            Appears normal
Cavum:                 Appears normal         Ductal Arch:            visualized color
doppler
Ventricles:            Appears normal         Diaphragm:              Appears normal
Choroid Plexus:        Appears normal         Stomach:                Appears normal, left
sided
Cerebellum:            Appears normal         Abdomen:                Appears normal
Posterior Fossa:       Appears normal         Abdominal Wall:         Appears nml (cord
insert, abd wall)
Nuchal Fold:           Appears normal         Cord Vessels:           Appears normal (3
vessel cord)
Face:                  Orbits nl; profile not Kidneys:                Appear normal
well visualized
Lips:                  Appears normal         Bladder:                Appears normal
Thoracic:              Appears normal         Spine:                  Appears normal
Heart:                 Appears normal         Upper Extremities:      Appears normal
(4CH, axis, and situs
RVOT:                  Appears normal         Lower Extremities:      Appears normal
LVOT:                  Appears normal
Cervix Uterus Adnexa

Cervix
Length:           4.13  cm.
Normal appearance by transabdominal scan.

Uterus
No abnormality visualized.

Left Ovary
Size(cm)     2.73   x   1.93   x  2.11      Vol(ml):
Within normal limits.

Right Ovary
Size(cm)     3.64   x   1.83   x  2.73      Vol(ml):
Within normal limits.

Cul De Sac:   No free fluid seen.

Adnexa:       No abnormality visualized.
Impression

Singleton intrauterine pregnancy at 20+1 weeks
Review of the anatomy shows no sonographic markers for
aneuploidy or structural anomalies
However, views of the fetal profile and aortic arch should be
considered suboptimal secondary to fetal position
Amniotic fluid volume is normal
Estimated fetal weight shows growth in the 55th percentile
Recommendations

Recommend repeat scan in 4 weeks to complete anatomic
survey

## 2019-11-11 ENCOUNTER — Ambulatory Visit (HOSPITAL_COMMUNITY)
Admission: EM | Admit: 2019-11-11 | Discharge: 2019-11-11 | Disposition: A | Discharge status: Home / Self Care | Payer: 59 | Attending: Physician Assistant | Admitting: Physician Assistant

## 2019-11-11 ENCOUNTER — Encounter (HOSPITAL_COMMUNITY): Payer: Self-pay

## 2019-11-11 ENCOUNTER — Other Ambulatory Visit: Payer: Self-pay

## 2019-11-11 DIAGNOSIS — G5621 Lesion of ulnar nerve, right upper limb: Secondary | ICD-10-CM | POA: Diagnosis not present

## 2019-11-11 MED ORDER — IBUPROFEN 600 MG PO TABS: 600.0000 mg | ORAL_TABLET | Freq: Four times a day (QID) | ORAL | 0 refills | Status: DC | PRN

## 2019-11-11 NOTE — Discharge Instructions (Addendum)
I want you to try the ibuprofen every 8 hours Try wrapping the elbow to prevent it from bending much at night  You may try ice   Follow up with the orthopedics office for evaluation

## 2019-11-11 NOTE — ED Provider Notes (Signed)
[facility]    CSN: [id number] Arrival date & time: 11/11/19  1914      History   Chief Complaint Chief Complaint  Patient presents with  . Hand Pain  . Arm Pain    HPI Catherine Underwood is a 39 y.o. female.   Patient with history of rheumatoid arthritis reports for evaluation of shooting pain from the right elbow to the right hand. She reports this is getting worse over the last couple weeks. She describes as shooting pain starting from the inside of her elbow shooting down her hand to her fourth and fifth fingers. She reports is also been present since March and she had followed with her rheumatologist for this. They at the time did not believe this was related to her RA. She has not seen another provider for this. She reports she does not rest her elbows on things much. She reports she sleeps with her elbow flexed at times. Denies injury to the right elbow.     Past Medical History:  Diagnosis Date  . Arthritis     Patient Active Problem List   Diagnosis Date Noted  . Sickle cell trait (HCC) 11/13/2017  . Penicillin allergy 11/11/2017  . Rheumatoid arthritis (HCC) 10/30/2017  . History of cesarean section 10/30/2017  . Preventative health care 04/19/2011    Past Surgical History:  Procedure Laterality Date  . CESAREAN SECTION    . CESAREAN SECTION N/A 04/14/2018   Procedure: REPEAT CESAREAN SECTION;  Surgeon: Kathrynn Running, MD;  Location: [facility] BIRTHING SU[facility];  Service: Obstetrics;  Laterality: N/A;    OB History    Gravida  4   Para  3   Term  3   Preterm  0   AB  1   Living  3     SAB  0   TAB  0   Ectopic  1   Multiple  0   Live Births  3            Home Medications    Prior to Admission medications   Medication Sig Start Date End Date Taking? Authorizing Provider  etanercept (ENBREL) 50 MG/ML injection Inject 50 mg into the skin once a week. Patient typically administers on Wednesdays.    [provider]    ibuprofen (ADVIL) 600 MG tablet Take 1 tablet (600 mg total) by mouth every 6 (six) hours as needed. 11/11/19   Izic Stfort, Veryl Speak, PA-C    Family History Family History  Problem Relation Age of Onset  . Hyperlipidemia Mother     Social History Social History   Tobacco Use  . Smoking status: Former Smoker    Types: Cigarettes    Quit date: 07/2017    Years since quitting: 2.3  . Smokeless tobacco: Never Used  . Tobacco comment: quit 08/18/2017  Substance Use Topics  . Alcohol use: No  . Drug use: No     Allergies   Red dye, Meloxicam, and Morphine and related   Review of Systems Review of Systems   Physical Exam Triage Vital Signs ED Triage Vitals  Enc Vitals Group     BP 11/11/19 1940 106/73     Pulse Rate 11/11/19 1940 (!) 101     Resp 11/11/19 1940 18     Temp 11/11/19 1940 98.9 F (37.2 C)     Temp Source 11/11/19 1940 Oral     SpO2 11/11/19 1940 99 %     Weight --  Height --      Head Circumference --      Peak Flow --      Pain Score 11/11/19 1938 8     Pain Loc --      Pain Edu? --      Excl. in [place]? --    No data found.  Updated Vital Signs BP 106/73 (BP Location: Right Arm)   Pulse (!) 101   Temp 98.9 F (37.2 C) (Oral)   Resp 18   SpO2 99%   Visual Acuity Right Eye Distance:   Left Eye Distance:   Bilateral Distance:    Right Eye Near:   Left Eye Near:    Bilateral Near:     Physical Exam Vitals and nursing note reviewed.  Constitutional:      Appearance: Normal appearance.  Musculoskeletal:     Comments: Right hand without swelling. No swelling at the elbow.  Tinel's positive at the elbow. There is mild weakness with resisted abduction of the fingers. Grip is strong. Mildly diminished sensation in the fingers of the ulnar nerve distribution in the right hand  Neurological:     Mental Status: She is alert and oriented to person, place, and time.      UC Treatments / Results  Labs (all labs ordered are listed, but only  abnormal results are displayed) Labs Reviewed - No data to display  EKG   Radiology No results found.  Procedures Procedures (including critical care time)  Medications Ordered in UC Medications - No data to display  Initial Impression / Assessment and Plan / UC Course  I have reviewed the triage vital signs and the nursing notes.  Pertinent labs & imaging results that were available during my care of the patient were reviewed by me and considered in my medical decision making (see chart for details).     #Ulnar neuropathy Patient is 39 year old presenting with symptoms consistent with an ulnar neuropathy. Inflammation versus entrapment. She has been trying to 200 mg of ibuprofen we will increase this to 600 every 8 hours. We will have her try positional changes at night., Will give orthopedic follow-up information for evaluation, as she will likely need surgical release. Patient verbalized understanding plan Final Clinical Impressions(s) / UC Diagnoses   Final diagnoses:  Ulnar neuropathy of right upper extremity     Discharge Instructions     I want you to try the ibuprofen every 8 hours Try wrapping the elbow to prevent it from bending much at night  You may try ice   Follow up with the orthopedics office for evaluation     ED Prescriptions    Medication Sig Dispense Auth. Provider   ibuprofen (ADVIL) 600 MG tablet Take 1 tablet (600 mg total) by mouth every 6 (six) hours as needed. 30 tablet Stormy Connon, Marguerita Beards, PA-C     PDMP not reviewed this encounter.   Purnell Shoemaker, PA-C 11/11/19 2026

## 2019-11-11 NOTE — ED Triage Notes (Signed)
Pt presents with right arm pain on right side that starts at fingertips and radiates up to elbow X 2 weeks.

## 2019-11-12 ENCOUNTER — Other Ambulatory Visit: Payer: Self-pay

## 2019-11-12 ENCOUNTER — Emergency Department (HOSPITAL_COMMUNITY)
Admission: EM | Admit: 2019-11-12 | Discharge: 2019-11-12 | Disposition: A | Discharge status: Home / Self Care | Payer: 59 | Attending: Emergency Medicine | Admitting: Emergency Medicine

## 2019-11-12 DIAGNOSIS — L988 Other specified disorders of the skin and subcutaneous tissue: Secondary | ICD-10-CM | POA: Insufficient documentation

## 2019-11-12 DIAGNOSIS — Z87891 Personal history of nicotine dependence: Secondary | ICD-10-CM | POA: Diagnosis not present

## 2019-11-12 DIAGNOSIS — M069 Rheumatoid arthritis, unspecified: Secondary | ICD-10-CM | POA: Insufficient documentation

## 2019-11-12 DIAGNOSIS — Z79899 Other long term (current) drug therapy: Secondary | ICD-10-CM | POA: Diagnosis not present

## 2019-11-12 DIAGNOSIS — N6310 Unspecified lump in the right breast, unspecified quadrant: Secondary | ICD-10-CM | POA: Insufficient documentation

## 2019-11-12 DIAGNOSIS — N63 Unspecified lump in unspecified breast: Secondary | ICD-10-CM

## 2019-11-12 DIAGNOSIS — M25521 Pain in right elbow: Secondary | ICD-10-CM | POA: Diagnosis present

## 2019-11-12 DIAGNOSIS — G5621 Lesion of ulnar nerve, right upper limb: Secondary | ICD-10-CM | POA: Diagnosis not present

## 2019-11-12 MED ORDER — CYCLOBENZAPRINE HCL 10 MG PO TABS: 10.0000 mg | ORAL_TABLET | Freq: Two times a day (BID) | ORAL | 0 refills | Status: DC | PRN

## 2019-11-12 MED ORDER — GABAPENTIN 100 MG PO CAPS: 100.0000 mg | ORAL_CAPSULE | Freq: Three times a day (TID) | ORAL | 0 refills | Status: DC | PRN

## 2019-11-12 NOTE — Discharge Instructions (Signed)
Take gabapentin as needed for nerve pain.  Take flexeril at night for pain. Call the breast center to schedule appointment for right breast ultrasound for further evaluation of your breast nodule.

## 2019-11-12 NOTE — ED Provider Notes (Signed)
[facility]-EMERGENCY DEPT Provider Note   CSN: [id number] Arrival date & time: 11/12/19  1653     History Chief Complaint  Patient presents with   right hand numbness    Catherine Underwood is a 39 y.o. female.  The history is provided by the patient and medical records. No language interpreter was used.     39 year old female with history of rheumatoid arthritis, sickle cell trait, presenting with multiple complaints.  Patient complaining of sharp shooting pain from her right elbow radiates towards the right hand with associated numbness ongoing for the past 2 weeks.  Pain describes a sharp shooting sensation starting in the elbow radiates towards her hand and involve her fourth and fifth finger.  Increasing pain with certain movement.  She report having difficulty closing her hand because of her discomfort.  She noticed more pain at nighttime.  She initially contacted her hematologist who prescribed a 5-day course of steroid.  She did report mild improvement with that but pain never fully resolved.  She was seen at urgent care center yesterday for her complaint.  She was giving ibuprofen.  She takes ibuprofen pain still present.  She denies any specific injury.  She denies repetitive movement.  She is right-hand dominant.  She denies any neck pain or shoulder pain.  Patient also noticed a nodule on her scalp about a week ago and also felt a nodule on her right breast a week ago.  She would like to be evaluated.  She report minimal pain with these nodules.  Denies any injury.  No history of cancer.  Past Medical History:  Diagnosis Date   Arthritis     Patient Active Problem List   Diagnosis Date Noted   Sickle cell trait (HCC) 11/13/2017   Penicillin allergy 11/11/2017   Rheumatoid arthritis (HCC) 10/30/2017   History of cesarean section 10/30/2017   Preventative health care 04/19/2011    Past Surgical History:  Procedure Laterality Date   CESAREAN  SECTION     CESAREAN SECTION N/A 04/14/2018   Procedure: REPEAT CESAREAN SECTION;  Surgeon: Kathrynn Running, MD;  Location: [facility] BIRTHING SUITES;  Service: Obstetrics;  Laterality: N/A;     OB History    Gravida  4   Para  3   Term  3   Preterm  0   AB  1   Living  3     SAB  0   TAB  0   Ectopic  1   Multiple  0   Live Births  3           Family History  Problem Relation Age of Onset   Hyperlipidemia Mother     Social History   Tobacco Use   Smoking status: Former Smoker    Types: Cigarettes    Quit date: 07/2017    Years since quitting: 2.3   Smokeless tobacco: Never Used   Tobacco comment: quit 08/18/2017  Substance Use Topics   Alcohol use: No   Drug use: No    Home Medications Prior to Admission medications   Medication Sig Start Date End Date Taking? Authorizing Provider  acetaminophen (TYLENOL) 500 MG tablet Take 500 mg by mouth every 6 (six) hours as needed for moderate pain.   Yes [provider]  ibuprofen (ADVIL) 600 MG tablet Take 1 tablet (600 mg total) by mouth every 6 (six) hours as needed. Patient taking differently: Take 600-1,200 mg by mouth every 6 (six) hours  as needed for moderate pain.  11/11/19  Yes Darr, Veryl Speak, PA-C  methotrexate 50 MG/2ML injection Inject into the muscle once a week.  10/20/19   [provider]    Allergies    Red dye, Meloxicam, and Morphine and related  Review of Systems   Review of Systems  Constitutional: Negative for fever.  Musculoskeletal: Positive for arthralgias.  Skin: Negative for rash and wound.  Neurological: Positive for numbness.    Physical Exam Updated Vital Signs BP 110/65 (BP Location: Right Arm)    Pulse (!) 101    Temp 98.7 F (37.1 C) (Oral)    Resp 17    SpO2 100%   Physical Exam Vitals and nursing note reviewed.  Constitutional:      General: She is not in acute distress.    Appearance: She is well-developed.  HENT:     Head: Atraumatic.      Comments: Scalp: There is a 1 cm subcutaneous nodule noted to frontal scalp mildly tender to palpation but no surrounding erythema and no significant fluctuant noted. Eyes:     Conjunctiva/sclera: Conjunctivae normal.  Cardiovascular:     Rate and Rhythm: Normal rate and regular rhythm.  Pulmonary:     Effort: Pulmonary effort is normal.     Breath sounds: Normal breath sounds.     Comments: Chaperone present during exam.  Examination of right breast with out any overlying skin changes, no nipple inversion, no peau d'orange, no obvious mass noted.  No signs of infection. Musculoskeletal:        General: Tenderness (Right arm: Minimal tenderness noted to the ulnar aspect of the right forearm starting at the elbow and extends towards right hand however normal grip strength and radial pulse 2+.) present.     Cervical back: Neck supple.  Skin:    Findings: No rash.  Neurological:     Mental Status: She is alert.     ED Results / Procedures / Treatments   Labs (all labs ordered are listed, but only abnormal results are displayed) Labs Reviewed - No data to display  EKG None  Radiology No results found.  Procedures Procedures (including critical care time)  Medications Ordered in ED Medications - No data to display  ED Course  I have reviewed the triage vital signs and the nursing notes.  Pertinent labs & imaging results that were available during my care of the patient were reviewed by me and considered in my medical decision making (see chart for details).    MDM Rules/Calculators/A&P                      BP 110/65 (BP Location: Right Arm)    Pulse (!) 101    Temp 98.7 F (37.1 C) (Oral)    Resp 17    SpO2 100%   Final Clinical Impression(s) / ED Diagnoses Final diagnoses:  Ulnar neuropathy at elbow, right  Breast nodule    Rx / DC Orders ED Discharge Orders         Ordered    US BREAST COMPLETE UNI RIGHT INC AXILLA     11/12/19 1926    gabapentin (NEURONTIN)  100 MG capsule  3 times daily PRN     11/12/19 1929    cyclobenzaprine (FLEXERIL) 10 MG tablet  2 times daily PRN     11/12/19 1929         7:19 PM Patient here with pain to her right forearm  radiates to her hand.  Pain follows distributions of the ulnar nerve.  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  Low suspicion for tenosynovitis, septic joint, or infectious etiology.  She is currently taking ibuprofen therefore I will prescribe muscle relaxant as well as Neurontin since she is recently finished a course of steroid already. Doubt stroke or cervical spinal impingement.   She also concern of 2 subcutaneous nodules. One on her scalp and the other one on her right breast.  These appear to be benign nodule.  No signs of abscess.  Low suspicion for malignancy.  Reassurance given.  Will refer patient to breast center for breast ultrasound for further evaluation of her breast nodule.   Domenic Moras, PA-C 11/12/19 1930    Drenda Freeze, MD 11/12/19 [phone]

## 2019-11-12 NOTE — ED Triage Notes (Addendum)
Per patient, her right hand and arm up to elbow have been numb for two weeks. Patient said pain is 10/10. Patient reports pcp gave her steroid pack but it did not help. Patient states she has a new lump on her scalp and new lump on her left breast also

## 2019-11-15 ENCOUNTER — Ambulatory Visit (INDEPENDENT_AMBULATORY_CARE_PROVIDER_SITE_OTHER): Payer: 59 | Admitting: Family Medicine

## 2019-11-15 ENCOUNTER — Other Ambulatory Visit: Payer: Self-pay

## 2019-11-15 ENCOUNTER — Encounter: Payer: Self-pay | Admitting: Family Medicine

## 2019-11-15 DIAGNOSIS — M79601 Pain in right arm: Secondary | ICD-10-CM | POA: Diagnosis not present

## 2019-11-15 MED ORDER — TRAMADOL HCL 50 MG PO TABS: 50.0000 mg | ORAL_TABLET | Freq: Four times a day (QID) | ORAL | 0 refills | Status: DC | PRN

## 2019-11-15 NOTE — Patient Instructions (Signed)
° ° °  B-Complex vitamin once daily  Gabapentin:  100 mg in morning and at noon, and 300-400 mg at bed time.

## 2019-11-15 NOTE — Progress Notes (Signed)
I saw and examined the patient with Dr. Robby Sermon and agree with assessment and plan as outlined.    Two weeks of severe burning ulnar-side right hand pain with no injury.  The only new event is the addition of methotrexate, but no other body areas hurt.  No neck pain, rash, fevers, trauma.   No change in work activities.    Will increase gabapentin at night.  Add B-complex vitamins.  Tramadol as needed.  PT referral given.  Nerve studies in 3-4 weeks if not improving.

## 2019-11-15 NOTE — Progress Notes (Signed)
Catherine Underwood - 39 y.o. female MRN [id number]  Date of birth: 09/08/1980  Office Visit Note: Visit Date: 11/15/2019 PCP: [facility] Referred by: [facility] *  Subjective: Chief Complaint  Patient presents with   Right Hand - Pain    Wakes up with stiff fingers in the right hand only, x 2 weeks. Numbness in middle and ring fingers of that hand x 1 week. NKI. Right-hand dominant. Has RA. Started a new regimen of methotrexate injection 2 weeks ago - she wonders if it is causing it.   HPI: Catherine Underwood is a 39 y.o. female with a hx of RA treated with MTX who comes in today with roughly 2.5 weeks of R hand pain. She describes it as initially starting at her medial elbow and shooting down her arm to her 3rd and 4th digits. Since that time she says the pain is now more of a burning, pins and needles  She went to the UC and was told to take ibuprofen which didn't help. She asked her rheumatologist who gave her a prednisone taper which didn't help either. Went to the ED were they gave her gabapentin and flexeril, neither of which provided relief. She finds running it under hot water helps a bit. Works at a [employer], and this makes completing her job very difficult. Inhibits ADLs because it's her right hand and she's R hand dominant.     ROS Otherwise per HPI.  Assessment & Plan: Visit Diagnoses: No diagnosis found.  Plan: Catherine Underwood is a 39 y.o. female with hx of RA treated with MTX who presented to clinic with burning/pins and needles pain of her R hand. The pain appears to worsen with manipulation of the ulnar nerve at the cubital tunnel suggesting inflammation of the ulnar nerve as the source for her pain. Discussed PT and a referral has been placed. Will also increase the dose of her gabapentin to 100mg  in the AM and afternoon and 300mg  at night. Additionally, will prescribe 50mg  tramadol q6hr PRN for pain. Educated on the  possibility of getting a nerve conduction study at 6 weeks if the pain is persistent at that time. Ms. Dust voiced understanding and agreement with these plans prior to leaving the visit.   Meds & Orders: No orders of the defined types were placed in this encounter.  No orders of the defined types were placed in this encounter.   Follow-up: No follow-ups on file.   Procedures: No procedures performed  No notes on file   Clinical History: No specialty comments available.   She reports that she quit smoking about 2 years ago. Her smoking use included cigarettes. She has never used smokeless tobacco. No results for input(s): HGBA1C, LABURIC in the last 8760 hours.  Objective:  VS:  HT:     WT:    BMI:      BP:    HR: bpm   TEMP: ( )   RESP:  Physical Exam  PHYSICAL EXAM: Gen: NAD, alert, cooperative with exam, well-appearing HEENT: clear conjunctiva,  CV:  no edema, capillary refill brisk, normal rate Resp: non-labored Skin: no rashes, normal turgor  Neuro: no gross deficits.  Psych:  alert and oriented  Ortho Exam  Hand: Inspection: No obvious deformity. No swelling, erythema or bruising Palpation: no TTP ROM: Full ROM of the digits and wrist. Fully able to extend and flex finger. Strength: 5/5 strength in the forearm, wrist and interosseus muscles Neurovascular:  NV intact Special tests: Negative finkelstein's, negative tinel's at the carpal tunnel  Fingers:  3/5 strength of the 3rd and 4th digits of the R hand. Sensation intact. No swelling in PIP, DIP joints. Flexor digitorum profundus and superficialis tendon functions are intact.  PIP joint collateral ligaments are stable.   Imaging: No results found.  Past Medical/Family/Surgical/Social History: Medications & Allergies reviewed per EMR, new medications updated. Patient Active Problem List   Diagnosis Date Noted   Sickle cell trait ([place]) 11/13/2017   Penicillin allergy 11/11/2017   Rheumatoid arthritis ([place])  10/30/2017   History of cesarean section 10/30/2017   Preventative health care 04/19/2011   Past Medical History:  Diagnosis Date   Arthritis    Family History  Problem Relation Age of Onset   Hyperlipidemia Mother    Past Surgical History:  Procedure Laterality Date   CESAREAN SECTION     CESAREAN SECTION N/A 04/14/2018   Procedure: REPEAT CESAREAN SECTION;  Surgeon: Gwynne Edinger, MD;  Location: [place];  Service: Obstetrics;  Laterality: N/A;   Social History   Occupational History   Not on file  Tobacco Use   Smoking status: Former Smoker    Types: Cigarettes    Quit date: 07/2017    Years since quitting: 2.3   Smokeless tobacco: Never Used   Tobacco comment: quit 08/18/2017  Substance and Sexual Activity   Alcohol use: No   Drug use: No   Sexual activity: Not Currently    Birth control/protection: None

## 2019-11-17 ENCOUNTER — Telehealth: Payer: Self-pay | Admitting: Family Medicine

## 2019-11-17 NOTE — Telephone Encounter (Signed)
Patient called.   She needs her note for work rewritten. It stated the incorrect body part and she also needs it to detail the duration of shifts she's able to work  Call back: [phone]

## 2019-11-17 NOTE — Telephone Encounter (Signed)
Tried to call her back to get clarification but her VM was full so I could not leave a message, we saw her for the right arm, and the note was written for the right arm, and need more specifics about what else she needs in the note.

## 2019-11-18 ENCOUNTER — Encounter: Payer: Self-pay | Admitting: Radiology

## 2019-11-18 ENCOUNTER — Telehealth: Payer: Self-pay

## 2019-11-18 NOTE — Telephone Encounter (Signed)
See other note

## 2019-11-18 NOTE — Telephone Encounter (Signed)
Patient called in to get work letter re worded to " Limited use for 4 hours of workday" , also patients wants "hand" instead of arm used in letter.

## 2019-11-18 NOTE — Telephone Encounter (Signed)
Her note has been rewritten and I tried to call but her VM was full.  I will try again in a few minutes to callher back.

## 2019-11-18 NOTE — Telephone Encounter (Signed)
I tried to call again but she did not answer,  I will put note downstairs for her to pick up

## 2019-11-19 ENCOUNTER — Emergency Department (HOSPITAL_COMMUNITY): Payer: 59

## 2019-11-19 ENCOUNTER — Other Ambulatory Visit: Payer: Self-pay

## 2019-11-19 ENCOUNTER — Encounter (HOSPITAL_COMMUNITY): Payer: Self-pay

## 2019-11-19 ENCOUNTER — Inpatient Hospital Stay
Admission: RE | Admit: 2019-11-19 | Discharge: 2019-11-19 | Disposition: A | Discharge status: Home / Self Care | Payer: 59 | Source: Ambulatory Visit

## 2019-11-19 ENCOUNTER — Emergency Department (HOSPITAL_COMMUNITY)
Admission: EM | Admit: 2019-11-19 | Discharge: 2019-11-19 | Disposition: A | Discharge status: Home / Self Care | Payer: 59 | Attending: Emergency Medicine | Admitting: Emergency Medicine

## 2019-11-19 DIAGNOSIS — R202 Paresthesia of skin: Secondary | ICD-10-CM | POA: Diagnosis present

## 2019-11-19 DIAGNOSIS — M79641 Pain in right hand: Secondary | ICD-10-CM | POA: Diagnosis not present

## 2019-11-19 DIAGNOSIS — Z880 Allergy status to penicillin: Secondary | ICD-10-CM | POA: Insufficient documentation

## 2019-11-19 DIAGNOSIS — Z79899 Other long term (current) drug therapy: Secondary | ICD-10-CM | POA: Insufficient documentation

## 2019-11-19 MED ORDER — PREDNISONE 10 MG PO TABS
40.0000 mg | ORAL_TABLET | Freq: Every day | ORAL | 0 refills | Status: AC
Start: 2019-11-19 — End: 2019-11-24

## 2019-11-19 NOTE — Discharge Instructions (Addendum)
At this time there does not appear to be the presence of an emergent medical condition, however there is always the potential for conditions to change. Please read and follow the below instructions.  Please return to the Emergency Department immediately for any new or worsening symptoms. Please be sure to follow up with your Primary Care Provider within one week regarding your visit today; please call their office to schedule an appointment even if you are feeling better for a follow-up visit. You may use the medication prednisone as prescribed to help with your symptoms.  Please drink plenty of water and get plenty of rest.  Please see the orthopedic specialist and your rheumatologist and your primary care doctor for follow-up.  If you do not have a primary care doctor you may call the [place] [facility] to establish one.  Get Help Right Away if: You have fever or chills You have redness or swelling You have balance issues or numbness or your leg You have difficulty speaking or drooping of your face You have any new/concerning or worsening of symptoms Your hand on the injured side feels numb or cold.  Please read the additional information packets attached to your discharge summary.  Do not take your medicine if  develop an itchy rash, swelling in your mouth or lips, or difficulty breathing; call 911 and seek immediate emergency medical attention if this occurs.  Note: Portions of this text may have been transcribed using voice recognition software. Every effort was made to ensure accuracy; however, inadvertent computerized transcription errors may still be present.

## 2019-11-19 NOTE — ED Provider Notes (Addendum)
[facility]-EMERGENCY DEPT Provider Note   CSN: [id number] Arrival date & time: 11/19/19  1148     History Chief Complaint  Patient presents with  . Numbness    right hand     Catherine Underwood is a 39 y.o. female history rheumatoid arthritis, sickle cell trait.  Patient presents today for right hand pain ongoing for the past several weeks, no known injury.  Patient describes a burning sensation to the ulnar portion of her right hand constant nonradiating no clear aggravating or alleviating factors and moderate-severe in intensity.  Patient reports that she has been seen multiple times for this in the past and was told that this was a problem with her ulnar nerve.  She has been on gabapentin as well as tramadol with minimal relief of her symptoms.  Associated with diminished sensation of ulnar right hand.  Denies fever/chills, fall/injury, joint swelling/color change, headache, vision changes, balance issues, neck pain or any additional concerns.  HPI     Past Medical History:  Diagnosis Date  . Arthritis     Patient Active Problem List   Diagnosis Date Noted  . Sickle cell trait (HCC) 11/13/2017  . Penicillin allergy 11/11/2017  . Rheumatoid arthritis (HCC) 10/30/2017  . History of cesarean section 10/30/2017  . Preventative health care 04/19/2011    Past Surgical History:  Procedure Laterality Date  . CESAREAN SECTION    . CESAREAN SECTION N/A 04/14/2018   Procedure: REPEAT CESAREAN SECTION;  Surgeon: Kathrynn Running, MD;  Location: [facility] BIRTHING SUITES;  Service: Obstetrics;  Laterality: N/A;     OB History    Gravida  4   Para  3   Term  3   Preterm  0   AB  1   Living  3     SAB  0   TAB  0   Ectopic  1   Multiple  0   Live Births  3           Family History  Problem Relation Age of Onset  . Hyperlipidemia Mother     Social History   Tobacco Use  . Smoking status: Former Smoker    Types: Cigarettes    Quit  date: 07/2017    Years since quitting: 2.3  . Smokeless tobacco: Never Used  . Tobacco comment: quit 08/18/2017  Vaping Use  . Vaping Use: Never used  Substance Use Topics  . Alcohol use: No  . Drug use: No    Home Medications Prior to Admission medications   Medication Sig Start Date End Date Taking? Authorizing Provider  acetaminophen (TYLENOL) 500 MG tablet Take 500 mg by mouth every 6 (six) hours as needed for moderate pain.    [provider]  cyclobenzaprine (FLEXERIL) 10 MG tablet Take 1 tablet (10 mg total) by mouth 2 (two) times daily as needed for muscle spasms. 11/12/19   Fayrene Helper, PA-C  gabapentin (NEURONTIN) 100 MG capsule Take 1 capsule (100 mg total) by mouth 3 (three) times daily as needed (nerve pain). 11/12/19   Fayrene Helper, PA-C  ibuprofen (ADVIL) 600 MG tablet Take 1 tablet (600 mg total) by mouth every 6 (six) hours as needed. Patient taking differently: Take 600-1,200 mg by mouth every 6 (six) hours as needed for moderate pain.  11/11/19   Darr, Veryl Speak, PA-C  methotrexate 50 MG/2ML injection Inject into the muscle once a week.  10/20/19   [provider]  predniSONE (DELTASONE) 10  MG tablet Take 4 tablets (40 mg total) by mouth daily for 5 days. 11/19/19 11/24/19  Nuala Alpha A, PA-C  traMADol (ULTRAM) 50 MG tablet Take 1 tablet (50 mg total) by mouth every 6 (six) hours as needed. 11/15/19   Hilts, Michael, MD    Allergies    Red dye, Meloxicam, and Morphine and related  Review of Systems   Review of Systems  Constitutional: Negative.  Negative for chills and fever.  Cardiovascular: Negative.  Negative for chest pain.  Gastrointestinal: Negative.  Negative for abdominal pain.  Musculoskeletal: Positive for arthralgias (Right hand). Negative for back pain and neck pain.  Skin: Negative.  Negative for color change and wound.  Neurological: Positive for numbness. Negative for dizziness, syncope, facial asymmetry, weakness and headaches.      Physical Exam Updated Vital Signs BP (!) 135/93   Pulse (!) 102   Temp 99.3 F (37.4 C) (Oral)   Resp 18   Ht 4\' 11"  (1.499 m)   Wt 95.7 kg   SpO2 100%   BMI 42.62 kg/m   Physical Exam Constitutional:      General: She is not in acute distress.    Appearance: Normal appearance. She is well-developed. She is not ill-appearing or diaphoretic.  HENT:     Head: Normocephalic and atraumatic.     Jaw: There is normal jaw occlusion.     Mouth/Throat:     Mouth: Mucous membranes are moist.     Pharynx: Oropharynx is clear. Uvula midline.  Eyes:     General: Vision grossly intact. Gaze aligned appropriately.     Extraocular Movements: Extraocular movements intact.     Conjunctiva/sclera: Conjunctivae normal.     Pupils: Pupils are equal, round, and reactive to light.     Comments: No pain with extraocular motion Visual fields grossly intact bilaterally  Neck:     Trachea: Trachea and phonation normal. No tracheal tenderness.  Cardiovascular:     Rate and Rhythm: Normal rate and regular rhythm.     Pulses:          Radial pulses are 2+ on the right side and 2+ on the left side.  Pulmonary:     Effort: Pulmonary effort is normal. No respiratory distress.     Breath sounds: Normal air entry.  Chest:     Chest wall: No tenderness.  Abdominal:     General: There is no distension.     Palpations: Abdomen is soft.     Tenderness: There is no abdominal tenderness. There is no guarding or rebound.  Musculoskeletal:        General: Normal range of motion.     Cervical back: Full passive range of motion without pain, normal range of motion and neck supple. No spinous process tenderness or muscular tenderness.     Comments: No midline C/T/L spinal tenderness to palpation, no paraspinal muscle tenderness, no deformity, crepitus, or step-off noted. - Full range of motion appropriate strength of the right shoulder.  Full range of motion at the right elbow, minimal tenderness around the  cubital fossa.  Right wrist with appropriate range of motion and strength.  Decreased sensation of the right fifth and fourth finger.  Good capillary refill.  Patient received diminished sensation of the right middle finger.  Strong equal radial pulses somewhat diminished grip strength patient reports secondary to pain.  Feet:     Right foot:     Protective Sensation: 2 sites tested. 2 sites sensed.  Left foot:     Protective Sensation: 2 sites tested. 2 sites sensed.  Skin:    General: Skin is warm and dry.  Neurological:     Mental Status: She is alert.     GCS: GCS eye subscore is 4. GCS verbal subscore is 5. GCS motor subscore is 6.     Comments: Speech is clear and goal oriented, follows commands Major Cranial nerves without deficit, no facial droop Moves extremities without ataxia, coordination intact  Psychiatric:        Behavior: Behavior normal.     ED Results / Procedures / Treatments   Labs (all labs ordered are listed, but only abnormal results are displayed) Labs Reviewed - No data to display  EKG None  Radiology DG Elbow Complete Right  Result Date: 11/19/2019 CLINICAL DATA:  Ulnar neuropathy EXAM: RIGHT ELBOW - COMPLETE 3+ VIEW COMPARISON:  None. FINDINGS: Frontal, lateral, and bilateral oblique views were obtained. No fracture or dislocation. No joint effusion. No joint space narrowing or erosion. IMPRESSION: No fracture or dislocation.  No evident arthropathy. Electronically Signed   By: Bretta Bang III M.D.   On: 11/19/2019 13:26    Procedures Procedures (including critical care time)  Medications Ordered in ED Medications - No data to display  ED Course  I have reviewed the triage vital signs and the nursing notes.  Pertinent labs & imaging results that were available during my care of the patient were reviewed by me and considered in my medical decision making (see chart for details).  Clinical Course as of Nov 19 1530  Sat Nov 19, 2019  1230  [facility] 11/15/2019 - Dr. Prince Rome  Assessment & Plan: Visit Diagnoses: No diagnosis found.  Plan: VALECIA BESKE is a 39 y.o. female with hx of RA treated with MTX who presented to clinic with burning/pins and needles pain of her R hand. The pain appears to worsen with manipulation of the ulnar nerve at the cubital tunnel suggesting inflammation of the ulnar nerve as the source for her pain. Discussed PT and a referral has been placed. Will also increase the dose of her gabapentin to 100mg  in the AM and afternoon and 300mg  at night. Additionally, will prescribe 50mg  tramadol q6hr PRN for pain. Educated on the possibility of getting a nerve conduction study at 6 weeks if the pain is persistent at that time. Catherine Underwood voiced understanding and agreement with these plans prior to leaving the visit.    [BM]    Clinical Course User Index [BM]   MDM Rules/Calculators/A&P                         Additional History Obtained: 1. Nursing notes from this visit. 2. Prior ED visit on November 12, 2019.  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  Patient was referred to hand specialist and breast clinic.  Patient was prescribed cyclobenzaprine and gabapentin. 3. Reviewed orthopedic visit and clinical course above.  Patient was also informed to start taking B vitamins to help with neuropathy. - Upon my assessment patient is well-appearing no acute distress she has paresthesias and neuropathy in the ulnar distribution of the right hand and minimal cubital fossa tenderness.  She does report some paresthesias including the right middle finger as well.  Range of motion and strength decreased secondary to pain.  There is no evidence of cellulitis, DVT, compartment syndrome, septic arthritis or other emergent pathologies  at this time.  She is strong equal radial pulses and good capillary refill and sensation.  Cranial nerve exam within normal limits no evidence of central cause of  her symptoms today.  Patient reports that she has not yet had an x-ray of her elbow, will obtain this today for evaluation. - DG right elbow:    IMPRESSION:  No fracture or dislocation. No evident arthropathy.  I have personally reviewed patient's x-ray and agree with radiologist interpretation.  No evidence of fracture or dislocation. - Patient was reevaluated resting comfortably no acute distress.  Advised patient to continue taking medications as prescribed by orthopedist as well as starting taking B vitamins which she has not yet done.  Patient is requesting prednisone to help with her symptoms, feel this is reasonable at this time as will likely help with inflammation from patient's peripheral neuropathy today.  She denies history of diabetes or adverse reaction to steroids in the past.  Patient reports that she has only had 1 course of prednisone prescribed by her rheumatologist so far this year.  Will prescribe patient a 5-day burst of prednisone 40 mg.  She was encouraged to follow-up with both her orthopedist and rheumatologist.  Additionally patient requested a referral for PCP, this has been provided.  Of note patient denies chance of pregnancy today.  Patient states understanding that medications given or prescribed today may result in harm to of a pregnancy and she accepts these risks and still chooses not to be pregnancy tested and proceed with medications.  Additionally patient denies history of CKD or gastric ulcers.  At this time there does not appear to be any evidence of an acute emergency medical condition and the patient appears stable for discharge with appropriate outpatient follow up. Diagnosis was discussed with patient who verbalizes understanding of care plan and is agreeable to discharge. I have discussed return precautions with patient who verbalizes understanding. Patient encouraged to follow-up with their PCP, Ortho and her rheumatologist. All questions  answered.  Patient's case discussed with Dr. Juleen China who agrees with plan to discharge with Prednisone and outpatient follow-up.   Note: Portions of this report may have been transcribed using voice recognition software. Every effort was made to ensure accuracy; however, inadvertent computerized transcription errors may still be present. Final Clinical Impression(s) / ED Diagnoses Final diagnoses:  Right hand pain    Rx / DC Orders ED Discharge Orders         Ordered    predniSONE (DELTASONE) 10 MG tablet  Daily     Discontinue  Reprint     11/19/19 1458           Bill Salinas, PA-C 11/19/19 1532    Bill Salinas, PA-C 11/19/19 1533    Margarita Grizzle, MD 11/21/19 1642

## 2019-11-19 NOTE — ED Triage Notes (Signed)
Patient reports right and numbness X14 days. Patient recently seen here at ED on Sunday per patient and given prescription for gabapentin. Patient followed up with ortho and given prescription for tramadol.   A/Ox4 Ambulatory in triage.

## 2019-11-24 ENCOUNTER — Other Ambulatory Visit: Payer: Self-pay | Admitting: Family Medicine

## 2020-01-22 ENCOUNTER — Telehealth: Payer: 59 | Admitting: Family

## 2020-01-22 DIAGNOSIS — M05712 Rheumatoid arthritis with rheumatoid factor of left shoulder without organ or systems involvement: Secondary | ICD-10-CM

## 2020-01-22 MED ORDER — PREDNISONE 20 MG PO TABS: 40.0000 mg | ORAL_TABLET | Freq: Every day | ORAL | 0 refills | Status: AC

## 2020-01-22 NOTE — Progress Notes (Signed)
E-Visit for Gout  We are sorry that you are not feeling well. We are here to help!  Based on what you shared with me it looks like you have a flare of your rheumatoid arthritis.   I have prescribed Prednisone 40 mg daily for 7   HOME CARE Losing weight can help relieve gout. It's not clear that following a specific diet plan will help with gout symptoms but eating a balanced diet can help improve your overall health. It can also help you lose weight, if you are overweight. In general, a healthy diet includes plenty of fruits, vegetables, whole grains, and low-fat dairy products (labelled "low fat", skim, 2%). Avoid sugar sweetened drinks (including sodas, tea, juice and juice blends, coffee drinks and sports drinks) Limit alcohol to 1-2 drinks of beer, spirits or wine daily these can make gout flares worse. Some people with gout also have other health problems, such as heart disease, high blood pressure, kidney disease, or obesity. If you have any of these issues, it's important to work with your doctor to manage them. This can help improve your overall health and might also help with your gout.  GET HELP RIGHT AWAY IF: . Your symptoms persist after you have completed your treatment plan . You develop severe diarrhea . You develop abnormal sensations .  You develop vomiting,  .  You develop weakness .  You develop abdominal pain  FOLLOW UP WITH YOUR PRIMARY PROVIDER IF: . If your symptoms do not improve within 10 days  MAKE SURE YOU   Understand these instructions.  Will watch your condition.  Will get help right away if you are not doing well or get worse.  Your e-visit answers were reviewed by a board certified advanced clinical practitioner to complete your personal care plan. Depending upon the condition, your plan could have included both over the counter or prescription medications.  Your safety is important to [place]. If you have drug allergies check your prescription carefully.    You can use MyChart to ask questions about today's visit, request a non-urgent call back, or ask for a work or school excuse for 24 hours related to this e-Visit. If it has been greater than 24 hours you will need to follow up with your provider, or enter a new e-Visit to address those concerns. You will get an e-mail with a link to a survey asking about your experience.  We hope that your e-visit has been valuable and will speed your recovery! Thank you for using e-visits.    Approximately 5 minutes was spent documenting and reviewing patient's chart.

## 2020-03-03 IMAGING — US US MFM FETAL BPP W/O NON-STRESS
1 series · 13 of 17 positions shown · non-contrast
Comparison: none

[Series 1: us mfm fetal bpp w/o non-stress · 17 acquisitions, 13 frames shown]
[im 1/17]
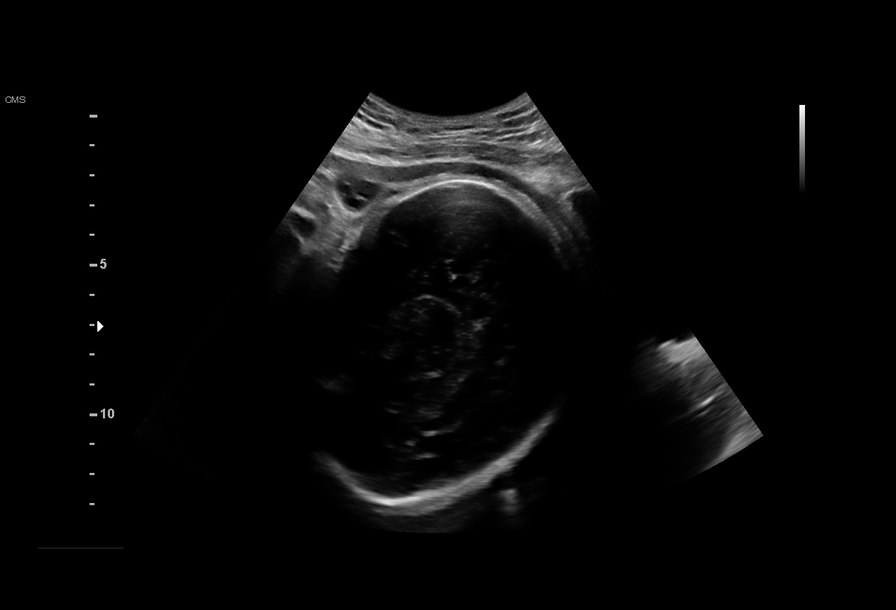
[im 2/17]
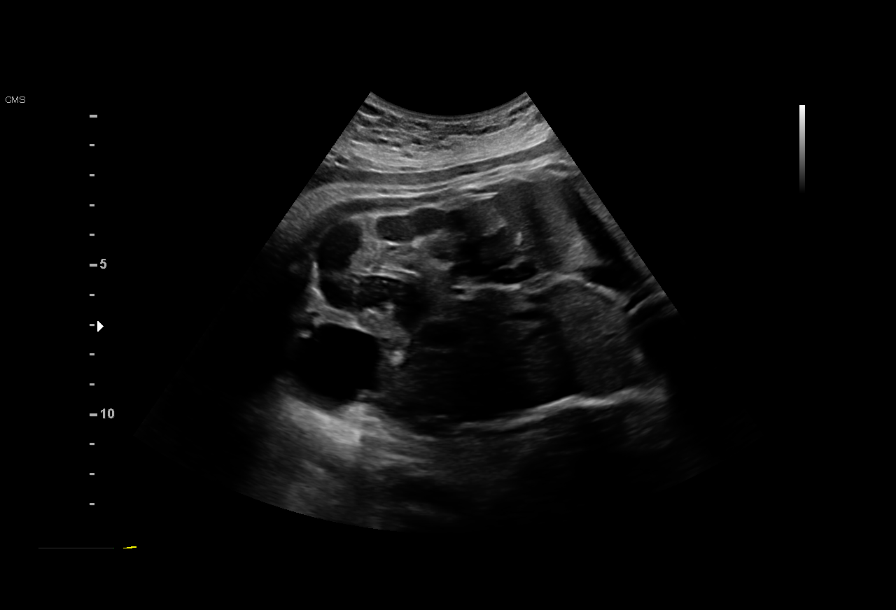
[im 4/17]
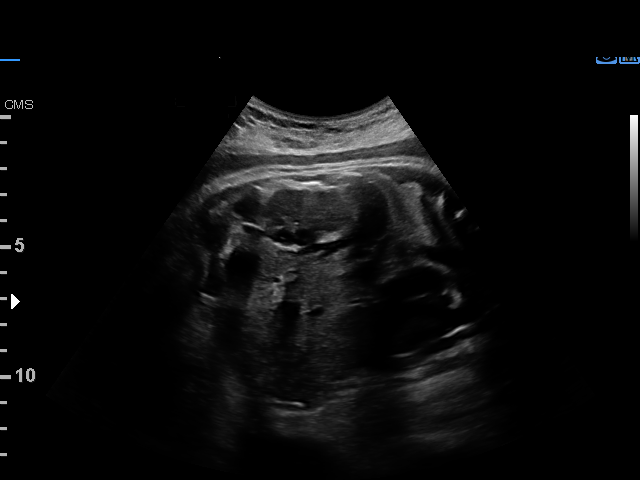
[im 5/17]
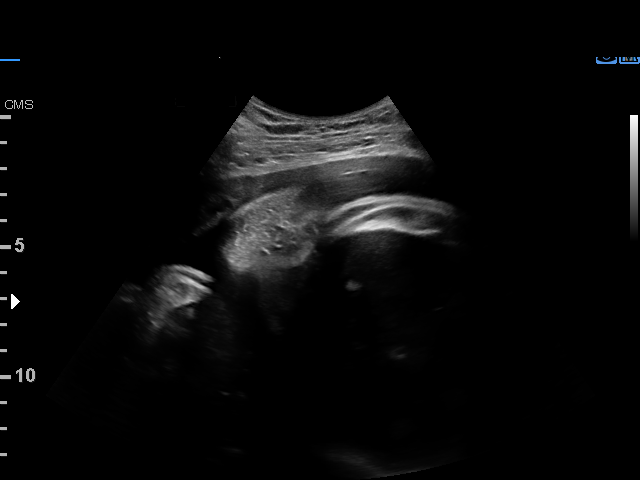
[im 6/17]
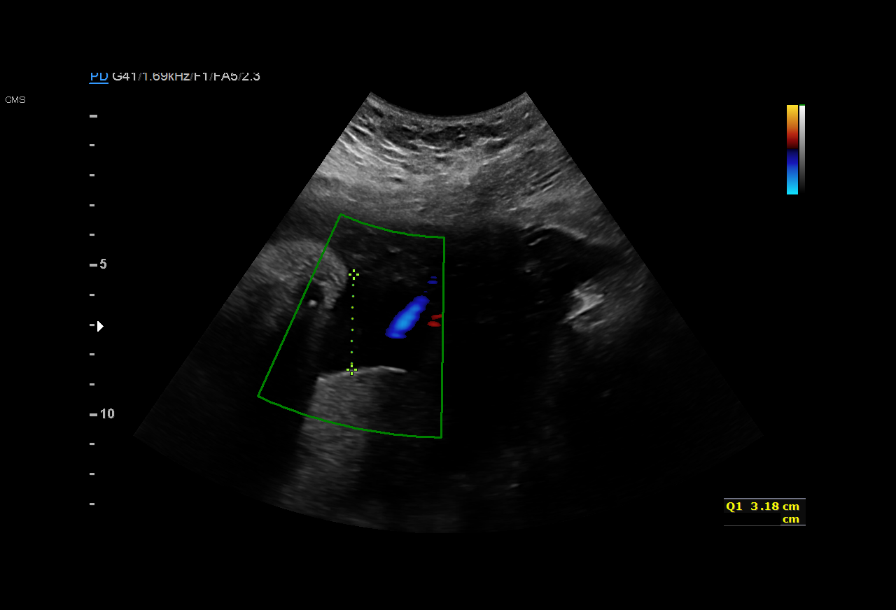
[im 8/17]
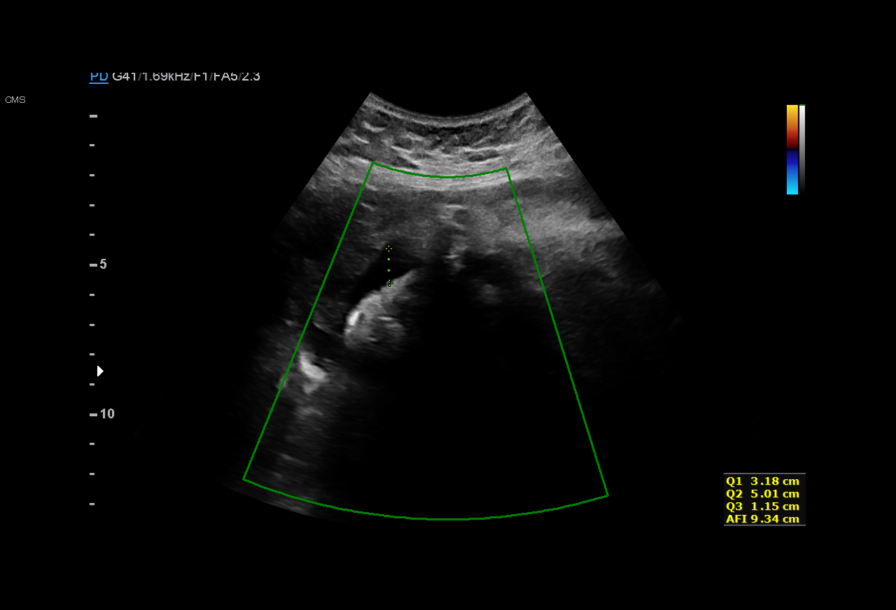
[im 9/17]
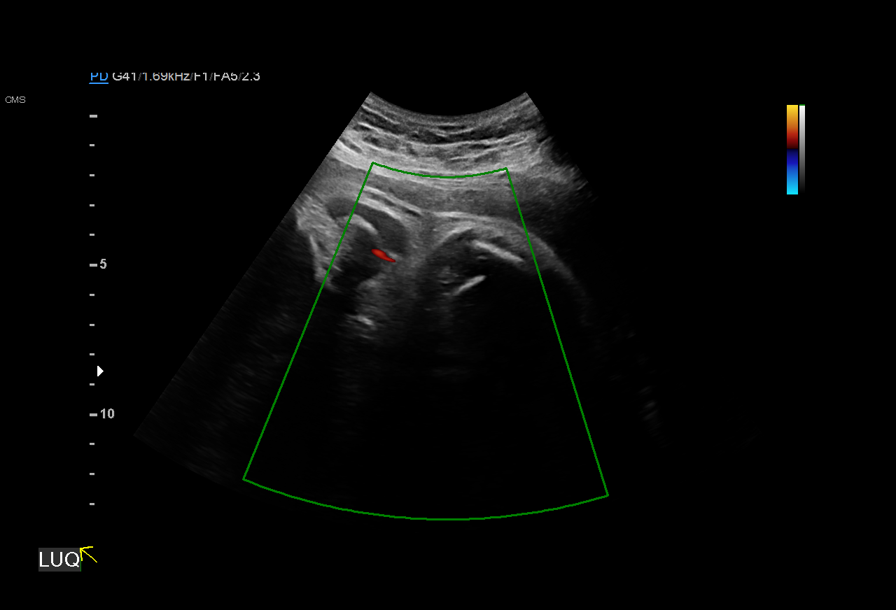
[im 10/17]
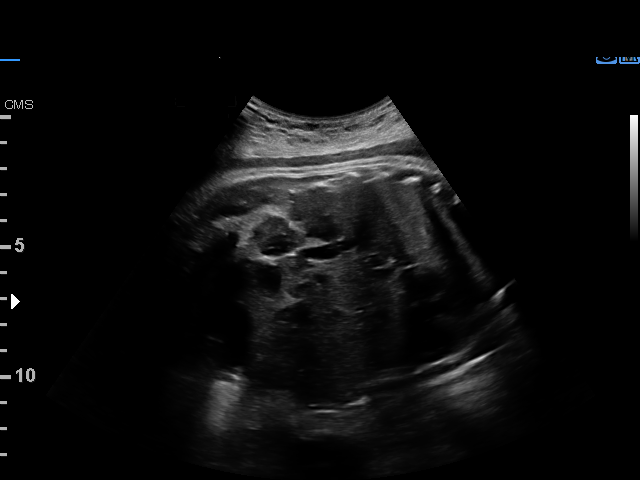
[im 12/17]
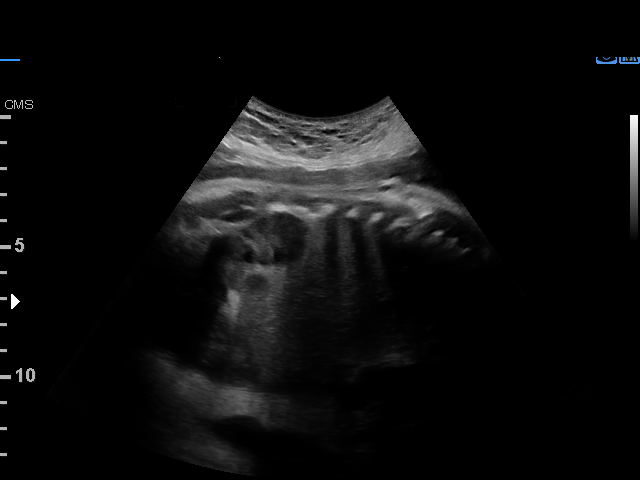
[im 13/17]
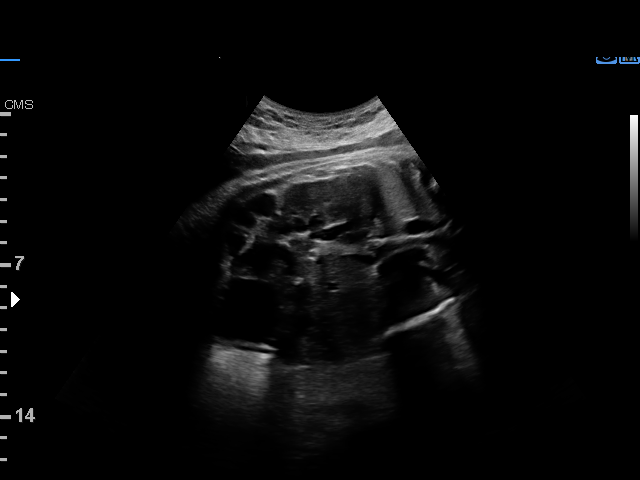
[im 14/17]
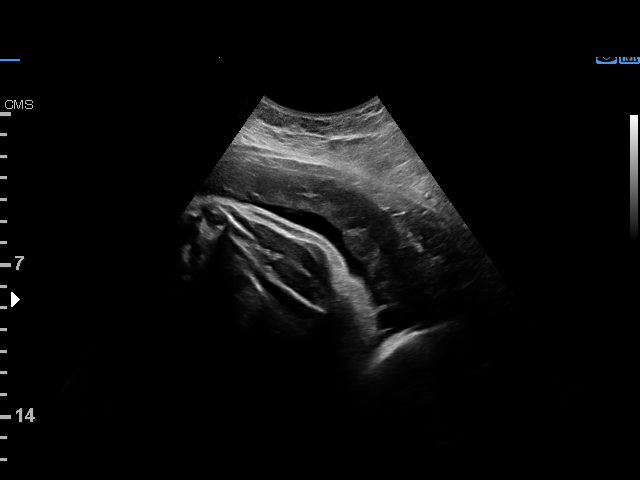
[im 16/17]
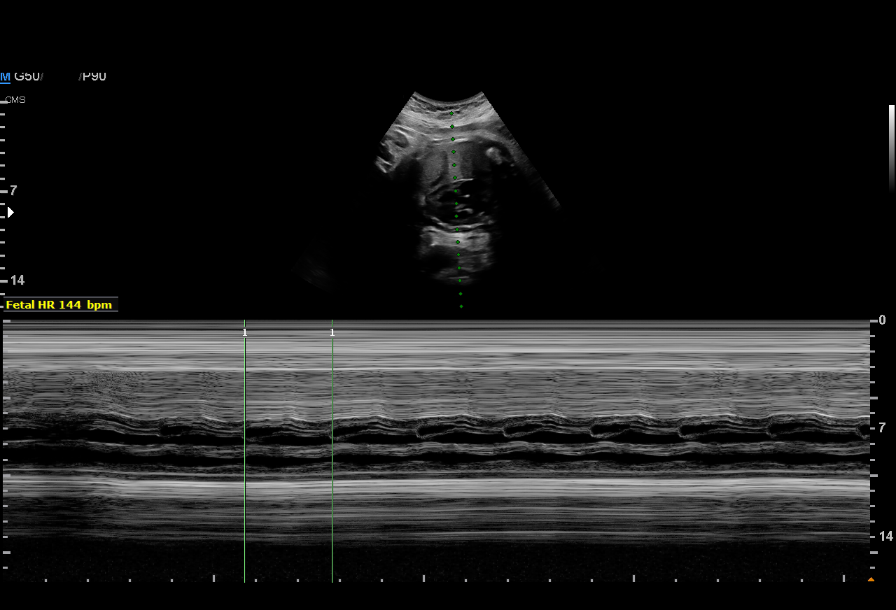
[im 17/17]
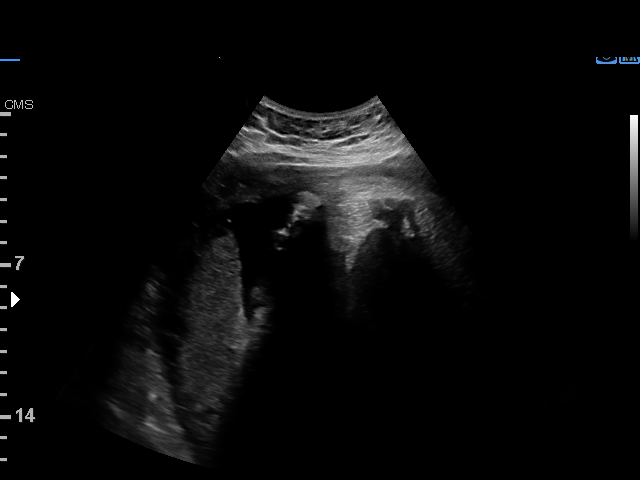

[13 of 17 positions shown; findings below may reference images not displayed]

Indications

Medical complication of pregnancy (RA)
History of sickle cell trait
Advanced maternal age multigravida 35+,
third trimester
Previous cesarean delivery, antepartum
Obesity complicating pregnancy, third
trimester
37 weeks gestation of pregnancy
Vital Signs

Height:        4'11"
Fetal Evaluation

Num Of Fetuses:          1
Fetal Heart Rate(bpm):   144
Cardiac Activity:        Observed
Presentation:            Cephalic

Amniotic Fluid
AFI FV:      Within normal limits

AFI Sum(cm)     %Tile       Largest Pocket(cm)
9.34            19

RUQ(cm)                     LUQ(cm)        LLQ(cm)
3.18
Biophysical Evaluation
Amniotic F.V:   Within normal limits       F. Tone:         Observed
F. Movement:    Observed                   Score:           [DATE]
F. Breathing:   Observed
OB History

Gravidity:    4         Term:   2
Ectopic:      1        Living:  2
Gestational Age

LMP:           37w 1d        Date:  07/15/17                 EDD:   04/21/18
Best:          37w 1d     Det. By:  LMP  (07/15/17)          EDD:   04/21/18
Impression

Normal Biophysical Profile
Recommendations

Continue weekly BPP until delivery given BMI >45

## 2020-03-06 ENCOUNTER — Telehealth: Payer: 59 | Admitting: Nurse Practitioner

## 2020-03-06 DIAGNOSIS — M05712 Rheumatoid arthritis with rheumatoid factor of left shoulder without organ or systems involvement: Secondary | ICD-10-CM

## 2020-03-06 NOTE — Progress Notes (Signed)
Based on what you shared with me, I feel your condition warrants further evaluation and I recommend that you be seen for a face to face visit. You are already being treated by specialist for your arthritis and since you are on methotrexate you will need to see them about any flare ups in your condition.     Please contact your primary care physician practice to be seen. Many offices offer virtual options to be seen via video if you are not comfortable going in person to a medical facility at this time.  If you do not have a PCP, [facility] offers a free physician referral service available at [phone]. Our trained staff has the experience, knowledge and resources to put you in touch with a physician who is right for you.   You also have the option of a video visit through [email or web address]  If you are having a true medical emergency please call 911.  NOTE: If you entered your credit card information for this eVisit, you will not be charged. You may see a "hold" on your card for the $35 but that hold will drop off and you will not have a charge processed.  Your e-visit answers were reviewed by a board certified advanced clinical practitioner to complete your personal care plan.  Thank you for using e-Visits.

## 2020-11-01 ENCOUNTER — Other Ambulatory Visit: Payer: Self-pay

## 2020-11-01 ENCOUNTER — Emergency Department (HOSPITAL_COMMUNITY)
Admission: EM | Admit: 2020-11-01 | Discharge: 2020-11-02 | Disposition: A | Discharge status: Home / Self Care | Payer: 59 | Attending: Emergency Medicine | Admitting: Emergency Medicine

## 2020-11-01 ENCOUNTER — Emergency Department (HOSPITAL_COMMUNITY): Payer: 59

## 2020-11-01 ENCOUNTER — Encounter (HOSPITAL_COMMUNITY): Payer: Self-pay | Admitting: Emergency Medicine

## 2020-11-01 DIAGNOSIS — M25561 Pain in right knee: Secondary | ICD-10-CM | POA: Diagnosis not present

## 2020-11-01 DIAGNOSIS — W010XXA Fall on same level from slipping, tripping and stumbling without subsequent striking against object, initial encounter: Secondary | ICD-10-CM | POA: Insufficient documentation

## 2020-11-01 DIAGNOSIS — Z87891 Personal history of nicotine dependence: Secondary | ICD-10-CM | POA: Diagnosis not present

## 2020-11-01 LAB — CBC WITH DIFFERENTIAL/PLATELET
Abs Immature Granulocytes: 0.01 10*3/uL (ref 0.00–0.07)
Basophils Absolute: 0 10*3/uL (ref 0.0–0.1)
Basophils Relative: 0 %
Eosinophils Absolute: 0 10*3/uL (ref 0.0–0.5)
Eosinophils Relative: 0 %
HCT: 36.7 % (ref 36.0–46.0)
Hemoglobin: 12.1 g/dL (ref 12.0–15.0)
Immature Granulocytes: 0 %
Lymphocytes Relative: 16 %
Lymphs Abs: 0.9 10*3/uL (ref 0.7–4.0)
MCH: 26.8 pg (ref 26.0–34.0)
MCHC: 33 g/dL (ref 30.0–36.0)
MCV: 81.4 fL (ref 80.0–100.0)
Monocytes Absolute: 0.1 10*3/uL (ref 0.1–1.0)
Monocytes Relative: 2 %
Neutro Abs: 4.6 10*3/uL (ref 1.7–7.7)
Neutrophils Relative %: 82 %
Platelets: 281 10*3/uL (ref 150–400)
RBC: 4.51 MIL/uL (ref 3.87–5.11)
RDW: 13.6 % (ref 11.5–15.5)
WBC: 5.7 10*3/uL (ref 4.0–10.5)
nRBC: 0 % (ref 0.0–0.2)

## 2020-11-01 LAB — COMPREHENSIVE METABOLIC PANEL
ALT: 13 U/L (ref 0–44)
AST: 19 U/L (ref 15–41)
Albumin: 4.2 g/dL (ref 3.5–5.0)
Alkaline Phosphatase: 60 U/L (ref 38–126)
Anion gap: 8 (ref 5–15)
BUN: 11 mg/dL (ref 6–20)
CO2: 23 mmol/L (ref 22–32)
Calcium: 9.1 mg/dL (ref 8.9–10.3)
Chloride: 106 mmol/L (ref 98–111)
Creatinine, Ser: 0.42 mg/dL — ABNORMAL LOW (ref 0.44–1.00)
GFR, Estimated: >60 mL/min (ref >60)
Glucose, Bld: 140 mg/dL — ABNORMAL HIGH (ref 70–99)
Potassium: 3.4 mmol/L — ABNORMAL LOW (ref 3.5–5.1)
Sodium: 137 mmol/L (ref 135–145)
Total Bilirubin: 0.5 mg/dL (ref 0.3–1.2)
Total Protein: 8.3 g/dL — ABNORMAL HIGH (ref 6.5–8.1)

## 2020-11-01 LAB — I-STAT BETA HCG BLOOD, ED (MC, WL, AP ONLY): I-stat hCG, quantitative: <5 m[IU]/mL (ref <5)

## 2020-11-01 LAB — D-DIMER, QUANTITATIVE: D-Dimer, Quant: 2.72 ug/mL-FEU — ABNORMAL HIGH (ref 0.00–0.50)

## 2020-11-01 NOTE — ED Triage Notes (Addendum)
Patient is complaining of falling on her right knee on the floor because she tripped over sons toys this morning. Patient has hx of rheumatoid arthritis.

## 2020-11-02 MED ORDER — OXYCODONE-ACETAMINOPHEN 5-325 MG PO TABS
2.0000 | ORAL_TABLET | Freq: Once | ORAL | Status: AC
Start: 2020-11-02 — End: 2020-11-02
  Administered 2020-11-02: 2 via ORAL
  Filled 2020-11-02: qty 2

## 2020-11-02 NOTE — ED Provider Notes (Signed)
[facility]-EMERGENCY DEPT Provider Note   CSN: [id number] Arrival date & time: 11/01/20  2223     History Chief Complaint  Patient presents with  . Leg Pain    Catherine Underwood is a 40 y.o. female.  The history is provided by the patient and medical records.  Leg Pain  40 year old female with history of rheumatoid arthritis, presenting to the ED with right knee pain.  She tripped over her son's toy that was in the floor today and fell with impact on the right knee.  States it has been aching throughout the day today.  She is on chronic prednisone therapy and actually took an extra dose of this today for some added pain relief.  She remains ambulatory but with some pain.  She denies any numbness or weakness of the leg.     Past Medical History:  Diagnosis Date  . Arthritis     Patient Active Problem List   Diagnosis Date Noted  . Sickle cell trait (HCC) 11/13/2017  . Penicillin allergy 11/11/2017  . Rheumatoid arthritis (HCC) 10/30/2017  . History of cesarean section 10/30/2017  . Preventative health care 04/19/2011    Past Surgical History:  Procedure Laterality Date  . CESAREAN SECTION    . CESAREAN SECTION N/A 04/14/2018   Procedure: REPEAT CESAREAN SECTION;  Surgeon: Kathrynn Running, MD;  Location: [facility] BIRTHING SUITES;  Service: Obstetrics;  Laterality: N/A;     OB History    Gravida  4   Para  3   Term  3   Preterm  0   AB  1   Living  3     SAB  0   IAB  0   Ectopic  1   Multiple  0   Live Births  3           Family History  Problem Relation Age of Onset  . Hyperlipidemia Mother     Social History   Tobacco Use  . Smoking status: Former Smoker    Types: Cigarettes    Quit date: 07/2017    Years since quitting: 3.3  . Smokeless tobacco: Never Used  . Tobacco comment: quit 08/18/2017  Vaping Use  . Vaping Use: Never used  Substance Use Topics  . Alcohol use: No  . Drug use: No    Home  Medications Prior to Admission medications   Medication Sig Start Date End Date Taking? Authorizing Provider  acetaminophen (TYLENOL) 500 MG tablet Take 500 mg by mouth every 6 (six) hours as needed for moderate pain.    [provider]  cyclobenzaprine (FLEXERIL) 10 MG tablet Take 1 tablet (10 mg total) by mouth 2 (two) times daily as needed for muscle spasms. 11/12/19   Fayrene Helper, PA-C  gabapentin (NEURONTIN) 100 MG capsule Take 1 capsule (100 mg total) by mouth 3 (three) times daily as needed (nerve pain). 11/12/19   Fayrene Helper, PA-C  ibuprofen (ADVIL) 600 MG tablet Take 1 tablet (600 mg total) by mouth every 6 (six) hours as needed. Patient taking differently: Take 600-1,200 mg by mouth every 6 (six) hours as needed for moderate pain.  11/11/19   Darr, Gerilyn Pilgrim, PA-C  methotrexate 50 MG/2ML injection Inject into the muscle once a week.  10/20/19   [provider]  traMADol (ULTRAM) 50 MG tablet TAKE 1 TABLET (50 MG TOTAL) BY MOUTH EVERY 6 (SIX) HOURS AS NEEDED. 11/24/19   Hilts, Casimiro Needle, MD    Allergies  Red dye, Meloxicam, Morphine sulfate, Penicillin g, and Morphine and related  Review of Systems   Review of Systems  Musculoskeletal: Positive for arthralgias.  All other systems reviewed and are negative.   Physical Exam Updated Vital Signs BP 96/86 (BP Location: Left Arm)   Pulse 71   Temp 99.3 F (37.4 C) (Oral)   Resp 16   Ht  (1.499 m)   Wt 90.7 kg   SpO2 100%   BMI 40.40 kg/m   Physical Exam Vitals and nursing note reviewed.  Constitutional:      Appearance: She is well-developed.  HENT:     Head: Normocephalic and atraumatic.  Eyes:     Conjunctiva/sclera: Conjunctivae normal.     Pupils: Pupils are equal, round, and reactive to light.  Cardiovascular:     Rate and Rhythm: Normal rate and regular rhythm.     Heart sounds: Normal heart sounds.  Pulmonary:     Effort: Pulmonary effort is normal. No respiratory distress.     Breath sounds:  Normal breath sounds. No rhonchi.  Abdominal:     General: Bowel sounds are normal.     Palpations: Abdomen is soft.  Musculoskeletal:        General: Normal range of motion.     Cervical back: Normal range of motion.     Comments: Right knee is mildly swollen in appearance, there is no erythema, pain elicited with range of motion, DP pulse intact, moving toes normally, foot is warm and well-perfused There is no calf tenderness or asymmetry, no overlying erythema or other skin changes  Skin:    General: Skin is warm and dry.  Neurological:     Mental Status: She is alert and oriented to person, place, and time.     ED Results / Procedures / Treatments   Labs (all labs ordered are listed, but only abnormal results are displayed) Labs Reviewed  COMPREHENSIVE METABOLIC PANEL - Abnormal; Notable for the following components:      Result Value   Potassium 3.4 (*)    Glucose, Bld 140 (*)    Creatinine, Ser 0.42 (*)    Total Protein 8.3 (*)    All other components within normal limits  D-DIMER, QUANTITATIVE - Abnormal; Notable for the following components:   D-Dimer, Quant 2.72 (*)    All other components within normal limits  CBC WITH DIFFERENTIAL/PLATELET  I-STAT BETA HCG BLOOD, ED ([facility], [facility], [facility] ONLY)    EKG None  Radiology DG Knee Complete 4 Views Right  Result Date: 11/01/2020 CLINICAL DATA:  Fall, right knee injury EXAM: RIGHT KNEE - COMPLETE 4+ VIEW COMPARISON:  08/21/2014 FINDINGS: Normal alignment. No fracture or dislocation. Mild medial compartment degenerative arthritis with osteophyte formation, unchanged. Minimal osteophytes are also noted within the patellofemoral compartment, slightly progressive since prior examination. Moderate right knee effusion is present. IMPRESSION: Moderate right knee effusion.  No fracture or dislocation. Mild progressive bicompartmental degenerative arthritis, most severe within the medial compartment. Electronically Signed   By: Helyn Numbers  MD   On: 11/01/2020 23:23    Procedures Procedures   Medications Ordered in ED Medications  oxyCODONE-acetaminophen (PERCOCET/ROXICET) 5-325 MG per tablet 2 tablet (has no administration in time range)    ED Course  I have reviewed the triage vital signs and the nursing notes.  Pertinent labs & imaging results that were available during my care of the patient were reviewed by me and considered in my medical decision making (see chart for  details).    MDM Rules/Calculators/A&P  40 year old female here with right knee pain after she tripped and fell over her son's toy in the floor today.  Has had worsening throbbing pain all day today.  She does have some mild swelling of the right knee and pain is elicited with attempted range of motion.  Her x-ray is negative for any acute bony findings but she does have effusion and chronic arthritic changes.  She remains ambulatory in the ED but with some pain.  Labs were sent from triage including D-dimer, unclear reasoning as to why as this was mechanical fall/injury.  Her D-dimer is elevated, but likely secondary to her chronic autoimmune conditions.  She has no known history of DVT/PE and no findings suggestive of such on exam.  She has no calf swelling/tenderness, no chest pain, no SOB, no tachycardia or hypoxia.  In the context of her visit today following a mechanical fall, I do not feel this needs to be studied further.  Will place in knee sleeve and refer to orthopedics for follow-up.  Follow-up with PCP also encouraged.  Return here for new concerns.  Final Clinical Impression(s) / ED Diagnoses Final diagnoses:  Acute pain of right knee    Rx / DC Orders ED Discharge Orders    None       Garlon Hatchet, PA-C 11/02/20 0358    Glynn Octave, MD 11/02/20 [phone]

## 2020-11-02 NOTE — Discharge Instructions (Signed)
Continue tylenol or motrin as needed for pain. Can try to ice/elevate knee to help bring swelling down. Follow-up with Dr. August Saucer if not improving in the next few days, otherwise can follow-up with your primary care doctor. Return here for new concerns.

## 2020-11-02 NOTE — ED Notes (Signed)
Pt verbalized understanding of d/c and follow up care. Ambulatory with steady gait.  

## 2020-12-21 ENCOUNTER — Emergency Department (HOSPITAL_COMMUNITY): Admission: EM | Admit: 2020-12-21 | Discharge: 2020-12-21 | Discharge status: Against Medical Advice | Payer: 59

## 2020-12-21 NOTE — ED Notes (Signed)
Attempted to call for triage with no respond. 

## 2022-07-10 ENCOUNTER — Encounter: Payer: 59 | Admitting: Obstetrics and Gynecology

## 2022-09-19 ENCOUNTER — Ambulatory Visit (HOSPITAL_COMMUNITY)
Admission: EM | Admit: 2022-09-19 | Discharge: 2022-09-19 | Disposition: A | Discharge status: Home / Self Care | Payer: 59 | Attending: Emergency Medicine | Admitting: Emergency Medicine

## 2022-09-19 ENCOUNTER — Encounter (HOSPITAL_COMMUNITY): Payer: Self-pay

## 2022-09-19 ENCOUNTER — Ambulatory Visit (INDEPENDENT_AMBULATORY_CARE_PROVIDER_SITE_OTHER): Payer: 59

## 2022-09-19 DIAGNOSIS — M25562 Pain in left knee: Secondary | ICD-10-CM | POA: Diagnosis not present

## 2022-09-19 DIAGNOSIS — S39012A Strain of muscle, fascia and tendon of lower back, initial encounter: Secondary | ICD-10-CM

## 2022-09-19 MED ORDER — IBUPROFEN 600 MG PO TABS: 600.0000 mg | ORAL_TABLET | Freq: Four times a day (QID) | ORAL | 0 refills | Status: DC | PRN

## 2022-09-19 MED ORDER — METHOCARBAMOL 500 MG PO TABS: 500.0000 mg | ORAL_TABLET | Freq: Two times a day (BID) | ORAL | 0 refills | Status: DC

## 2022-09-19 NOTE — Discharge Instructions (Addendum)
You appear to have a musculoskeletal strain of your lower back.  Please take the ibuprofen every 6 hours as needed to help with your pain and inflammation.  You can also use the muscle relaxer, do not drink or drive on this medication as it may make you drowsy.  Please do warm compress to your back as well as gentle stretching, you can do a bath with Epsom salt.  If your symptoms persist beyond the next few weeks, please contact [place] sports medicine for further evaluation.  Your knee imaging was negative for fracture or dislocation, there was some soft tissue swelling.  Please compress it with an Ace wrap, ice it, rested and elevate this extremity.   Please return to clinic or your PCP if you develop any new or concerning symptoms.
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
## 2022-12-14 ENCOUNTER — Telehealth: Payer: 59 | Admitting: Family Medicine

## 2022-12-14 NOTE — Progress Notes (Signed)
Pt did not show for visit after multiple visits. DWB

## 2023-04-02 ENCOUNTER — Other Ambulatory Visit: Payer: Self-pay

## 2023-04-02 DIAGNOSIS — Z1231 Encounter for screening mammogram for malignant neoplasm of breast: Secondary | ICD-10-CM

## 2023-04-07 ENCOUNTER — Ambulatory Visit
Admission: RE | Admit: 2023-04-07 | Discharge: 2023-04-07 | Disposition: A | Discharge status: Home / Self Care | Payer: 59 | Source: Ambulatory Visit

## 2023-04-07 DIAGNOSIS — Z1231 Encounter for screening mammogram for malignant neoplasm of breast: Secondary | ICD-10-CM

## 2023-05-29 ENCOUNTER — Ambulatory Visit (HOSPITAL_COMMUNITY)
Admission: EM | Admit: 2023-05-29 | Discharge: 2023-05-29 | Disposition: A | Discharge status: Home / Self Care | Payer: 59

## 2023-05-29 ENCOUNTER — Encounter (HOSPITAL_COMMUNITY): Payer: Self-pay | Admitting: Emergency Medicine

## 2023-05-29 DIAGNOSIS — M25562 Pain in left knee: Secondary | ICD-10-CM | POA: Insufficient documentation

## 2023-05-29 DIAGNOSIS — M25462 Effusion, left knee: Secondary | ICD-10-CM | POA: Insufficient documentation

## 2023-05-29 DIAGNOSIS — G8929 Other chronic pain: Secondary | ICD-10-CM | POA: Insufficient documentation

## 2023-05-29 DIAGNOSIS — M069 Rheumatoid arthritis, unspecified: Secondary | ICD-10-CM | POA: Insufficient documentation

## 2023-05-29 LAB — SYNOVIAL CELL COUNT + DIFF, W/ CRYSTALS
Crystals, Fluid: NONE SEEN
Eosinophils-Synovial: 0 % (ref 0–1)
Lymphocytes-Synovial Fld: 27 % — ABNORMAL HIGH (ref 0–20)
Monocyte-Macrophage-Synovial Fluid: 7 % — ABNORMAL LOW (ref 50–90)
Neutrophil, Synovial: 66 % — ABNORMAL HIGH (ref 0–25)
WBC, Synovial: 4675 /mm3 — ABNORMAL HIGH (ref 0–200)

## 2023-05-29 MED ORDER — TRIAMCINOLONE ACETONIDE 40 MG/ML IJ SUSP
INTRAMUSCULAR | Status: AC
  Filled 2023-05-29: qty 1

## 2023-05-29 MED ORDER — TRIAMCINOLONE ACETONIDE 40 MG/ML IJ SUSP
40.0000 mg | Freq: Once | INTRAMUSCULAR | Status: AC
  Administered 2023-05-29: 40 mg via INTRA_ARTICULAR

## 2023-05-29 MED ORDER — LIDOCAINE HCL (PF) 1 % IJ SOLN
INTRAMUSCULAR | Status: AC
  Filled 2023-05-29: qty 30

## 2023-05-29 MED ORDER — BACITRACIN ZINC 500 UNIT/GM EX OINT
TOPICAL_OINTMENT | CUTANEOUS | Status: AC
  Filled 2023-05-29: qty 1.8

## 2023-05-29 NOTE — Discharge Instructions (Addendum)
May have some slight drainage after procedure We will call you with any abnormal test results No tub baths or submerging in water for 24-48 hours  Use antibiotic ointment if you change your dressing

## 2023-05-29 NOTE — ED Triage Notes (Signed)
Pt c/o left leg pain mainly at knee. States she has arthritis and tried to be seen by PCP but [place] are [place] till [place]

## 2023-05-29 NOTE — ED Provider Notes (Signed)
[facility]    CSN: [id number] Arrival date & time: 05/29/23  0844      History   Chief Complaint Chief Complaint  Patient presents with   Leg Pain    HPI Catherine Underwood is a 42 y.o. female.  Patient has rheumatoid arthritis and sees rheumatology.  She has had left knee pain for 6 to 12 months and had an aspiration and injection of steroids 6 months ago with her rheumatologist.  She had swelling developed 2 weeks ago and it is progressively worsened such that she cannot walk easily at this time.  She has left knee pain that varies from a scale of 5-10/10 when walking.  She presents today requesting aspiration and infusion of steroid in the left knee if possible.  Past Medical History:  Diagnosis Date   Arthritis     Patient Active Problem List   Diagnosis Date Noted   Sickle cell trait (HCC) 11/13/2017   Penicillin allergy 11/11/2017   Rheumatoid arthritis (HCC) 10/30/2017   History of cesarean section 10/30/2017   Preventative health care 04/19/2011    Past Surgical History:  Procedure Laterality Date   CESAREAN SECTION     CESAREAN SECTION N/A 04/14/2018   Procedure: REPEAT CESAREAN SECTION;  Surgeon: Kathrynn Running, MD;  Location: [facility];  Service: Obstetrics;  Laterality: N/A;    OB History     Gravida  4   Para  3   Term  3   Preterm  0   AB  1   Living  3      SAB  0   IAB  0   Ectopic  1   Multiple  0   Live Births  3            Home Medications    Prior to Admission medications   Medication Sig Start Date End Date Taking? Authorizing Provider  predniSONE (DELTASONE) 5 MG tablet Take 10 mg by mouth 2 (two) times daily as needed. 01/22/20  Yes [provider]  acetaminophen (TYLENOL) 500 MG tablet Take 500 mg by mouth every 6 (six) hours as needed for moderate pain.    [provider]  cyclobenzaprine (FLEXERIL) 10 MG tablet Take 1 tablet (10 mg total) by mouth 2 (two) times daily as  needed for muscle spasms. Patient not taking: Reported on 05/29/2023 11/12/19   Fayrene Helper, PA-C  gabapentin (NEURONTIN) 100 MG capsule Take 1 capsule (100 mg total) by mouth 3 (three) times daily as needed (nerve pain). Patient not taking: Reported on 05/29/2023 11/12/19   Fayrene Helper, PA-C  ibuprofen (ADVIL) 600 MG tablet Take 1 tablet (600 mg total) by mouth every 6 (six) hours as needed. 09/19/22   Garrison, [place] N, FNP  methocarbamol (ROBAXIN) 500 MG tablet Take 1 tablet (500 mg total) by mouth 2 (two) times daily. Patient not taking: Reported on 05/29/2023 09/19/22   Garrison, [place] N, FNP  methotrexate 50 MG/2ML injection Inject into the muscle once a week.  10/20/19   [provider]  traMADol (ULTRAM) 50 MG tablet TAKE 1 TABLET (50 MG TOTAL) BY MOUTH EVERY 6 (SIX) HOURS AS NEEDED. Patient not taking: Reported on 05/29/2023 11/24/19   Hilts, Casimiro Needle, MD    Family History Family History  Problem Relation Age of Onset   Hyperlipidemia Mother    Breast cancer Neg Hx     Social History Social History   Tobacco Use   Smoking status: Former  Current packs/day: 0.00    Types: Cigarettes    Quit date: 07/2017    Years since quitting: 5.8   Smokeless tobacco: Never   Tobacco comments:    quit 08/18/2017  Vaping Use   Vaping status: Never Used  Substance Use Topics   Alcohol use: No   Drug use: No     Allergies   Red dye #40 (allura red), Meloxicam, Morphine sulfate, Penicillin g, and Morphine and codeine   Review of Systems Review of Systems  Constitutional:  Positive for activity change. Negative for fever.  HENT: Negative.    Eyes: Negative.   Respiratory:  Negative for cough and shortness of breath.   Cardiovascular:  Negative for chest pain.  Gastrointestinal:  Negative for abdominal pain, constipation, diarrhea, nausea and vomiting.  Genitourinary:  Negative for dysuria.  Musculoskeletal:  Positive for arthralgias (left knee) and joint swelling (left  knee).  Skin:  Negative for color change and rash.  Neurological: Negative.   Hematological:  Does not bruise/bleed easily.  Psychiatric/Behavioral: Negative.       Physical Exam Triage Vital Signs ED Triage Vitals  Encounter Vitals Group     BP 05/29/23 0914 133/81     Systolic BP Percentile --      Diastolic BP Percentile --      Pulse Rate 05/29/23 0914 (!) 102     Resp 05/29/23 0914 17     Temp 05/29/23 0914 98.1 F (36.7 C)     Temp Source 05/29/23 0914 Oral     SpO2 05/29/23 0914 95 %     Weight --      Height --      Head Circumference --      Peak Flow --      Pain Score 05/29/23 0912 9     Pain Loc --      Pain Education --      Exclude from Growth Chart --    No data found.  Updated Vital Signs BP 133/81 (BP Location: Right Arm)   Pulse (!) 102   Temp 98.1 F (36.7 C) (Oral)   Resp 17   SpO2 95%   Breastfeeding No   Visual Acuity Right Eye Distance:   Left Eye Distance:   Bilateral Distance:    Right Eye Near:   Left Eye Near:    Bilateral Near:     Physical Exam Constitutional:      General: She is not in acute distress.    Appearance: Normal appearance. She is normal weight.  HENT:     Head: Normocephalic and atraumatic.     Right Ear: Hearing normal.     Left Ear: Hearing normal.     Nose: Nose normal.     Mouth/Throat:     Lips: Pink.     Mouth: Mucous membranes are moist.     Pharynx: Oropharynx is clear. Uvula midline.  Eyes:     General: Lids are normal. Vision grossly intact. Gaze aligned appropriately.     Pupils: Pupils are equal, round, and reactive to light.  Cardiovascular:     Heart sounds: Normal heart sounds, S1 normal and S2 normal.  Pulmonary:     Effort: Pulmonary effort is normal.     Breath sounds: Normal breath sounds.  Abdominal:     Tenderness: Negative signs include Murphy's sign.  Musculoskeletal:     Cervical back: Full passive range of motion without pain.  Lymphadenopathy:     Cervical: No cervical  adenopathy.  Skin:    General: Skin is warm and dry.  Neurological:     Mental Status: She is alert and oriented to person, place, and time.     Gait: Gait is intact.  Psychiatric:        Attention and Perception: Attention normal.        Mood and Affect: Mood normal.        Speech: Speech normal.        Behavior: Behavior is cooperative.      UC Treatments / Results  Labs (all labs ordered are listed, but only abnormal results are displayed) Labs Reviewed  SYNOVIAL CELL COUNT + DIFF, W/ CRYSTALS  URIC ACID, BODY FLUID    EKG   Radiology No results found.  Procedures Join Aspiration/Injection  Date/Time: 05/29/2023 10:20 AM  Performed by: Prescilla Sours, FNP Authorized by: Prescilla Sours, FNP   Consent:    Consent obtained:  Verbal   Consent given by:  Patient   Risks, benefits, and alternatives were discussed: yes     Risks discussed:  Bleeding, infection, pain and incomplete drainage   Alternatives discussed:  No treatment, referral and alternative treatment Universal protocol:    Procedure explained and questions answered to patient or proxy's satisfaction: yes     Site/side marked: yes     Immediately prior to procedure, a time out was called: yes     Patient identity confirmed:  Verbally with patient and arm band Location:    Location:  Knee Anesthesia:    Anesthesia method:  Local infiltration   Local anesthetic:  Lidocaine 1% w/o epi Procedure details:    Preparation: Patient was prepped and draped in usual sterile fashion     Needle gauge:  18 G   Ultrasound guidance: no     Approach:  Lateral   Aspirate amount:  30 ml   Aspirate characteristics:  Serous   Steroid injected: yes     Specimen collected: yes   Post-procedure details:    Dressing:  Sterile dressing   Procedure completion:  Tolerated well, no immediate complications Comments:     Patient reports relief of pain and significant improvement in her ability to walk/gait (felt it was back  to normal).  (including critical care time)  Medications Ordered in UC Medications  triamcinolone acetonide (KENALOG-40) injection 40 mg (40 mg Intra-articular Given 05/29/23 1011)    Initial Impression / Assessment and Plan / UC Course  I have reviewed the triage vital signs and the nursing notes.  Pertinent labs & imaging results that were available during my care of the patient were reviewed by me and considered in my medical decision making (see chart for details).  Left knee effusion:  Left Knee aspirated, 30 ml of synovial fluid obtained.  Injected with 7 mL of 1% lidocaine without epinephrine and 1 mL of Kenalog 40 mg into the synovial space.  Bandage applied.  Patient advised of signs or symptoms that would indicate need to return here or go to an emergency room.  Encouraged to follow-up with her rheumatologist.  May use OTC ibuprofen as needed pain. Final Clinical Impressions(s) / UC Diagnoses   Final diagnoses:  Rheumatoid arthritis involving left knee, unspecified whether rheumatoid factor present (HCC)  Effusion of bursa of left knee  Chronic pain of left knee     Discharge Instructions      May have some slight drainage after procedure We will call you with any abnormal test results No tub  baths or submerging in water for 24-48 hours  Use antibiotic ointment if you change your dressing     ED Prescriptions   None    PDMP not reviewed this encounter.   Prescilla Sours, FNP 05/29/23 1030

## 2023-05-31 LAB — URIC ACID, BODY FLUID: Uric Acid Body Fluid: 4.7 mg/dL

## 2023-06-30 DIAGNOSIS — M0579 Rheumatoid arthritis with rheumatoid factor of multiple sites without organ or systems involvement: Secondary | ICD-10-CM | POA: Diagnosis not present

## 2023-06-30 DIAGNOSIS — Z79899 Other long term (current) drug therapy: Secondary | ICD-10-CM | POA: Diagnosis not present

## 2023-06-30 DIAGNOSIS — M25562 Pain in left knee: Secondary | ICD-10-CM | POA: Diagnosis not present

## 2023-09-16 DIAGNOSIS — M0579 Rheumatoid arthritis with rheumatoid factor of multiple sites without organ or systems involvement: Secondary | ICD-10-CM | POA: Diagnosis not present

## 2023-09-29 DIAGNOSIS — M0579 Rheumatoid arthritis with rheumatoid factor of multiple sites without organ or systems involvement: Secondary | ICD-10-CM | POA: Diagnosis not present

## 2023-09-29 DIAGNOSIS — Z79899 Other long term (current) drug therapy: Secondary | ICD-10-CM | POA: Diagnosis not present

## 2023-09-29 DIAGNOSIS — M25562 Pain in left knee: Secondary | ICD-10-CM | POA: Diagnosis not present

## 2023-09-30 DIAGNOSIS — M0579 Rheumatoid arthritis with rheumatoid factor of multiple sites without organ or systems involvement: Secondary | ICD-10-CM | POA: Diagnosis not present

## 2023-10-07 DIAGNOSIS — R7309 Other abnormal glucose: Secondary | ICD-10-CM | POA: Diagnosis not present

## 2023-10-07 DIAGNOSIS — Z79899 Other long term (current) drug therapy: Secondary | ICD-10-CM | POA: Diagnosis not present

## 2023-10-07 DIAGNOSIS — R946 Abnormal results of thyroid function studies: Secondary | ICD-10-CM | POA: Diagnosis not present

## 2023-10-28 DIAGNOSIS — M0579 Rheumatoid arthritis with rheumatoid factor of multiple sites without organ or systems involvement: Secondary | ICD-10-CM | POA: Diagnosis not present

## 2023-11-17 ENCOUNTER — Ambulatory Visit (HOSPITAL_COMMUNITY)
Admission: EM | Admit: 2023-11-17 | Discharge: 2023-11-17 | Disposition: A | Discharge status: Home / Self Care | Attending: Emergency Medicine | Admitting: Emergency Medicine

## 2023-11-17 ENCOUNTER — Encounter

## 2023-11-17 ENCOUNTER — Encounter (HOSPITAL_COMMUNITY): Payer: Self-pay

## 2023-11-17 DIAGNOSIS — G5701 Lesion of sciatic nerve, right lower limb: Secondary | ICD-10-CM | POA: Insufficient documentation

## 2023-11-17 DIAGNOSIS — M545 Low back pain, unspecified: Secondary | ICD-10-CM | POA: Insufficient documentation

## 2023-11-17 LAB — POCT URINALYSIS DIP (MANUAL ENTRY)
Bilirubin, UA: NEGATIVE
Glucose, UA: NEGATIVE mg/dL
Ketones, POC UA: NEGATIVE mg/dL
Nitrite, UA: POSITIVE — AB
Protein Ur, POC: NEGATIVE mg/dL
Spec Grav, UA: 1.025 (ref 1.010–1.025)
Urobilinogen, UA: 0.2 U/dL
pH, UA: 6 (ref 5.0–8.0)

## 2023-11-17 MED ORDER — NAPROXEN 500 MG PO TABS: 500.0000 mg | ORAL_TABLET | Freq: Two times a day (BID) | ORAL | 0 refills | Status: AC

## 2023-11-17 MED ORDER — TIZANIDINE HCL 2 MG PO TABS: 2.0000 mg | ORAL_TABLET | Freq: Every evening | ORAL | 0 refills | Status: AC

## 2023-11-17 MED ORDER — CYCLOBENZAPRINE HCL 10 MG PO TABS: 10.0000 mg | ORAL_TABLET | Freq: Two times a day (BID) | ORAL | 0 refills | Status: DC | PRN

## 2023-11-17 NOTE — ED Triage Notes (Signed)
 Patient reports that she has right lower back pain that radiates to the top of the right buttock x 3 days.  Patient states she had Ibuprofen  at 0815 today.

## 2023-11-17 NOTE — ED Provider Notes (Signed)
 [facility]    CSN: [id number] Arrival date & time: 11/17/23  1040      History   Chief Complaint Chief Complaint  Patient presents with   Back Pain    HPI Catherine Underwood is a 43 y.o. female.  Here with 3 day history of right low back pain that radiates into the right buttock. Rating 6/10 pain. Does not extend down the leg. Denies injury, trauma, fall. Not having bladder or bowel dysfunction No weakness or paresthesias Denies history of this  Took ibuprofen  about 3 hours ago, without relief   Past Medical History:  Diagnosis Date   Arthritis     Patient Active Problem List   Diagnosis Date Noted   Sickle cell trait (HCC) 11/13/2017   Penicillin allergy 11/11/2017   Rheumatoid arthritis (HCC) 10/30/2017   History of cesarean section 10/30/2017   Preventative health care 04/19/2011    Past Surgical History:  Procedure Laterality Date   CESAREAN SECTION     CESAREAN SECTION N/A 04/14/2018   Procedure: REPEAT CESAREAN SECTION;  Surgeon: Janeane Mealy, MD;  Location: [facility];  Service: Obstetrics;  Laterality: N/A;    OB History     Gravida  4   Para  3   Term  3   Preterm  0   AB  1   Living  3      SAB  0   IAB  0   Ectopic  1   Multiple  0   Live Births  3            Home Medications    Prior to Admission medications   Medication Sig Start Date End Date Taking? Authorizing Provider  inFLIXimab (REMICADE IV) Inject into the vein. Every 6 weeks   Yes [provider]  tiZANidine (ZANAFLEX) 2 MG tablet Take 1 tablet (2 mg total) by mouth at bedtime. 11/17/23  Yes Bambie Pizzolato, Ivette Marks, PA-C  acetaminophen  (TYLENOL ) 500 MG tablet Take 500 mg by mouth every 6 (six) hours as needed for moderate pain.    [provider]  naproxen  (NAPROSYN ) 500 MG tablet Take 1 tablet (500 mg total) by mouth 2 (two) times daily. 11/17/23  Yes Charvi Gammage, Ivette Marks, PA-C    Family History Family History  Problem Relation  Age of Onset   Hyperlipidemia Mother    Breast cancer Neg Hx     Social History Social History   Tobacco Use   Smoking status: Former    Current packs/day: 0.00    Types: Cigarettes    Quit date: 07/2017    Years since quitting: 6.3   Smokeless tobacco: Never   Tobacco comments:    quit 08/18/2017  Vaping Use   Vaping status: Never Used  Substance Use Topics   Alcohol use: No   Drug use: No     Allergies   Red dye #40 (allura red), Meloxicam , Morphine  sulfate, Penicillin g, and Morphine  and codeine    Review of Systems Review of Systems  Musculoskeletal:  Positive for back pain.   As per HPI  Physical Exam Triage Vital Signs ED Triage Vitals [11/17/23 1106]  Encounter Vitals Group     BP 106/74     Systolic BP Percentile      Diastolic BP Percentile      Pulse Rate (!) 104     Resp 14     Temp 98.4 F (36.9 C)     Temp Source Oral  SpO2 96 %     Weight      Height      Head Circumference      Peak Flow      Pain Score 6     Pain Loc      Pain Education      Exclude from Growth Chart    No data found.  Updated Vital Signs BP 106/74 (BP Location: Left Arm)   Pulse 98   Temp 98.4 F (36.9 C) (Oral)   Resp 14   SpO2 96%   Physical Exam Vitals and nursing note reviewed.  Constitutional:      General: She is not in acute distress. HENT:     Mouth/Throat:     Mouth: Mucous membranes are moist.     Pharynx: Oropharynx is clear.  Eyes:     Extraocular Movements: Extraocular movements intact.     Conjunctiva/sclera: Conjunctivae normal.     Pupils: Pupils are equal, round, and reactive to light.  Cardiovascular:     Rate and Rhythm: Normal rate and regular rhythm.     Heart sounds: Normal heart sounds.  Pulmonary:     Effort: Pulmonary effort is normal.     Breath sounds: Normal breath sounds.  Abdominal:     Tenderness: There is no right CVA tenderness or left CVA tenderness.  Musculoskeletal:        General: Normal range of motion.      Cervical back: Normal range of motion. No rigidity or tenderness.     Lumbar back: Negative right straight leg raise test and negative left straight leg raise test.       Back:     Comments: Negative straight leg raise. Right glute pain is felt with figure 4 position (external rotation of right hip with flexion at right knee)  Skin:    General: Skin is warm and dry.     Findings: No bruising, erythema or lesion.  Neurological:     General: No focal deficit present.     Mental Status: She is alert and oriented to person, place, and time.     Cranial Nerves: Cranial nerves 2-12 are intact. No cranial nerve deficit.     Sensory: Sensation is intact.     Motor: Motor function is intact. No weakness.     Coordination: Coordination is intact.     Gait: Gait is intact.     Deep Tendon Reflexes: Reflexes are normal and symmetric.     Comments: Strength 5/5. Sensation intact throughout      UC Treatments / Results  Labs (all labs ordered are listed, but only abnormal results are displayed) Labs Reviewed  POCT URINALYSIS DIP (MANUAL ENTRY) - Abnormal; Notable for the following components:      Result Value   Color, UA straw (*)    Clarity, UA cloudy (*)    Blood, UA trace-intact (*)    Nitrite, UA Positive (*)    Leukocytes, UA Small (1+) (*)    All other components within normal limits  URINE CULTURE    EKG  Radiology No results found.  Procedures Procedures  Medications Ordered in UC Medications - No data to display  Initial Impression / Assessment and Plan / UC Course  I have reviewed the triage vital signs and the nursing notes.  Pertinent labs & imaging results that were available during my care of the patient were reviewed by me and considered in my medical decision making (see chart for details).  Afebrile, well appearing, stable vitals Neurologically intact No red flags Discussed etiologies, likely muscular strain, pyriformis syndrome. No bony tenderness to  indicate need for xray imaging.  Try naproxen  BID. Mobic  allergy is rash -- has tolerated toradol  and ibuprofen . Will also try zanaflex 2 mg nightly. Flexeril  10 mg makes her drowsy for a while so will try half dose of different muscle relaxer.  Additionally discussed other supportive care including topical treatments, hot pad, gentle stretching.  Follow-up with primary care as recommended.  We had discussed she has no symptoms of UTI or kidney issue, symptoms are all low back and glute. No abdominal or flank pain with palpation. Reassurance was provided.  However at discharge she requests a urine test to make sure. UA has mall leuks and +nitrates. Again patient has NO urinary symptoms, denies dysuria, hematuria, urgency, frequency, abdominal or flank pain. Will culture and treat if results indicate need for antibiotic.   Final Clinical Impressions(s) / UC Diagnoses   Final diagnoses:  Acute right-sided low back pain without sciatica  Piriformis syndrome of right side     Discharge Instructions      Please do not use any other NSAIDs (ibuprofen , advil , motrin , aleve , etc) You can safely take Tylenol   You can take the muscle relaxer Zanaflex at bedtime. I have sent it as half a dose so it may not make you as drowsy as the Flexeril .  Use topical therapies such as icyhot, biofreeze, lidocaine  patches, hot pad, etc Try gentle stretching - I have attached some exercises I recommend  Please contact your primary care provider for follow up  We will call you if anything results on your urine culture    ED Prescriptions     Medication Sig Dispense Auth. Provider   cyclobenzaprine  (FLEXERIL ) 10 MG tablet  (Status: Discontinued) Take 1 tablet (10 mg total) by mouth 2 (two) times daily as needed for muscle spasms. 20 tablet Jamira Barfuss, PA-C   naproxen  (NAPROSYN ) 500 MG tablet Take 1 tablet (500 mg total) by mouth 2 (two) times daily. 30 tablet Tiler Brandis, PA-C   tiZANidine  (ZANAFLEX) 2 MG tablet Take 1 tablet (2 mg total) by mouth at bedtime. 30 tablet Alizia Greif, Ivette Marks, PA-C      PDMP not reviewed this encounter.   Tyannah Sane, Ivette Marks, [place] 11/17/23 1240

## 2023-11-17 NOTE — Discharge Instructions (Addendum)
 Please do not use any other NSAIDs (ibuprofen , advil , motrin , aleve , etc) You can safely take Tylenol   You can take the muscle relaxer Zanaflex at bedtime. I have sent it as half a dose so it may not make you as drowsy as the Flexeril .  Use topical therapies such as icyhot, biofreeze, lidocaine  patches, hot pad, etc Try gentle stretching - I have attached some exercises I recommend  Please contact your primary care provider for follow up  We will call you if anything results on your urine culture

## 2023-11-19 ENCOUNTER — Ambulatory Visit (HOSPITAL_COMMUNITY): Payer: Self-pay

## 2023-11-19 DIAGNOSIS — R8290 Unspecified abnormal findings in urine: Secondary | ICD-10-CM | POA: Diagnosis not present

## 2023-11-19 LAB — URINE CULTURE: Culture: >=100000 — AB

## 2023-11-19 MED ORDER — SULFAMETHOXAZOLE-TRIMETHOPRIM 800-160 MG PO TABS: 1.0000 | ORAL_TABLET | Freq: Two times a day (BID) | ORAL | 0 refills | Status: AC

## 2023-11-19 NOTE — Addendum Note (Signed)
 Addended by: Sharilyn Davenport on: 11/19/2023 04:42 PM   Modules accepted: Orders

## 2023-11-19 NOTE — Telephone Encounter (Signed)
 Recommend treatment with Bactrim  double strength 1 tab twice daily x 5 days.

## 2023-12-23 DIAGNOSIS — M0579 Rheumatoid arthritis with rheumatoid factor of multiple sites without organ or systems involvement: Secondary | ICD-10-CM | POA: Diagnosis not present

## 2024-01-14 DIAGNOSIS — Z79899 Other long term (current) drug therapy: Secondary | ICD-10-CM | POA: Diagnosis not present

## 2024-01-14 DIAGNOSIS — M1990 Unspecified osteoarthritis, unspecified site: Secondary | ICD-10-CM | POA: Diagnosis not present

## 2024-01-14 DIAGNOSIS — M0579 Rheumatoid arthritis with rheumatoid factor of multiple sites without organ or systems involvement: Secondary | ICD-10-CM | POA: Diagnosis not present

## 2024-01-14 DIAGNOSIS — M25562 Pain in left knee: Secondary | ICD-10-CM | POA: Diagnosis not present

## 2024-02-11 DIAGNOSIS — M0579 Rheumatoid arthritis with rheumatoid factor of multiple sites without organ or systems involvement: Secondary | ICD-10-CM | POA: Diagnosis not present

## 2024-02-17 DIAGNOSIS — M0579 Rheumatoid arthritis with rheumatoid factor of multiple sites without organ or systems involvement: Secondary | ICD-10-CM | POA: Diagnosis not present

## 2024-05-02 DIAGNOSIS — M0579 Rheumatoid arthritis with rheumatoid factor of multiple sites without organ or systems involvement: Secondary | ICD-10-CM | POA: Diagnosis not present

## 2024-08-25 ENCOUNTER — Ambulatory Visit: Admitting: Dermatology
